# Patient Record
Sex: Male | Born: 1950
Health system: Southern US, Community
[De-identification: ages and names within clinical notes are randomized; demographics above are authoritative.]

## PROBLEM LIST (undated history)

## (undated) DIAGNOSIS — D126 Benign neoplasm of colon, unspecified: Secondary | ICD-10-CM

## (undated) DIAGNOSIS — Z9289 Personal history of other medical treatment: Secondary | ICD-10-CM

## (undated) DIAGNOSIS — M25511 Pain in right shoulder: Secondary | ICD-10-CM

## (undated) DIAGNOSIS — N281 Cyst of kidney, acquired: Secondary | ICD-10-CM

## (undated) DIAGNOSIS — N402 Nodular prostate without lower urinary tract symptoms: Secondary | ICD-10-CM

## (undated) DIAGNOSIS — Z8719 Personal history of other diseases of the digestive system: Secondary | ICD-10-CM

## (undated) DIAGNOSIS — K5730 Diverticulosis of large intestine without perforation or abscess without bleeding: Secondary | ICD-10-CM

## (undated) DIAGNOSIS — I10 Essential (primary) hypertension: Secondary | ICD-10-CM

## (undated) DIAGNOSIS — M79672 Pain in left foot: Secondary | ICD-10-CM

## (undated) DIAGNOSIS — K760 Fatty (change of) liver, not elsewhere classified: Secondary | ICD-10-CM

## (undated) DIAGNOSIS — E785 Hyperlipidemia, unspecified: Secondary | ICD-10-CM

## (undated) DIAGNOSIS — N182 Chronic kidney disease, stage 2 (mild): Secondary | ICD-10-CM

## (undated) DIAGNOSIS — N200 Calculus of kidney: Secondary | ICD-10-CM

## (undated) HISTORY — DX: Cyst of kidney, acquired: N28.1

## (undated) HISTORY — DX: Nodular prostate without lower urinary tract symptoms: N40.2

## (undated) HISTORY — PX: COLONOSCOPY W/ POLYPECTOMY: SHX1380

## (undated) HISTORY — DX: Personal history of other medical treatment: Z92.89

## (undated) HISTORY — DX: Pain in left foot: M79.672

## (undated) HISTORY — DX: Personal history of other diseases of the digestive system: Z87.19

## (undated) HISTORY — DX: Fatty (change of) liver, not elsewhere classified: K76.0

## (undated) HISTORY — DX: Pain in right shoulder: M25.511

## (undated) HISTORY — DX: Diverticulosis of large intestine without perforation or abscess without bleeding: K57.30

## (undated) HISTORY — PX: HEMORRHOID SURGERY: SHX153

## (undated) HISTORY — DX: Calculus of kidney: N20.0

## (undated) HISTORY — PX: CHALAZION EXCISION: SHX213

## (undated) HISTORY — PX: NOSE SURGERY: SHX723

## (undated) HISTORY — DX: Benign neoplasm of colon, unspecified: D12.6

## (undated) HISTORY — DX: Essential (primary) hypertension: I10

## (undated) HISTORY — DX: Chronic kidney disease, stage 2 (mild): N18.2

## (undated) HISTORY — DX: Hyperlipidemia, unspecified: E78.5

---

## 1998-03-31 ENCOUNTER — Ambulatory Visit (HOSPITAL_COMMUNITY): Admission: RE | Admit: 1998-03-31 | Discharge: 1998-03-31 | Payer: Self-pay | Admitting: Cardiology

## 2000-01-03 ENCOUNTER — Ambulatory Visit (HOSPITAL_COMMUNITY): Admission: RE | Admit: 2000-01-03 | Discharge: 2000-01-03 | Payer: Self-pay | Admitting: Cardiology

## 2000-01-03 ENCOUNTER — Encounter: Payer: Self-pay | Admitting: Cardiology

## 2001-12-24 ENCOUNTER — Encounter: Payer: Self-pay | Admitting: Cardiology

## 2001-12-24 ENCOUNTER — Ambulatory Visit (HOSPITAL_COMMUNITY): Admission: RE | Admit: 2001-12-24 | Discharge: 2001-12-24 | Payer: Self-pay | Admitting: Cardiology

## 2002-01-07 ENCOUNTER — Ambulatory Visit (HOSPITAL_COMMUNITY): Admission: RE | Admit: 2002-01-07 | Discharge: 2002-01-07 | Payer: Self-pay | Admitting: Cardiology

## 2002-01-07 ENCOUNTER — Encounter: Payer: Self-pay | Admitting: Cardiology

## 2002-03-01 ENCOUNTER — Emergency Department (HOSPITAL_COMMUNITY): Admission: EM | Admit: 2002-03-01 | Discharge: 2002-03-01 | Payer: Self-pay | Admitting: Emergency Medicine

## 2002-03-01 ENCOUNTER — Encounter: Payer: Self-pay | Admitting: Emergency Medicine

## 2005-12-26 ENCOUNTER — Ambulatory Visit (HOSPITAL_COMMUNITY): Admission: RE | Admit: 2005-12-26 | Discharge: 2005-12-26 | Payer: Self-pay | Admitting: Cardiology

## 2006-03-31 ENCOUNTER — Ambulatory Visit (HOSPITAL_COMMUNITY): Admission: RE | Admit: 2006-03-31 | Discharge: 2006-03-31 | Payer: Self-pay | Admitting: Cardiology

## 2006-04-04 ENCOUNTER — Encounter: Payer: Self-pay | Admitting: Family Medicine

## 2006-04-12 ENCOUNTER — Encounter (INDEPENDENT_AMBULATORY_CARE_PROVIDER_SITE_OTHER): Payer: Self-pay | Admitting: *Deleted

## 2006-04-12 ENCOUNTER — Encounter: Admission: RE | Admit: 2006-04-12 | Discharge: 2006-04-12 | Payer: Self-pay | Admitting: *Deleted

## 2006-04-18 DIAGNOSIS — D126 Benign neoplasm of colon, unspecified: Secondary | ICD-10-CM

## 2006-04-18 HISTORY — DX: Benign neoplasm of colon, unspecified: D12.6

## 2006-04-20 ENCOUNTER — Encounter (INDEPENDENT_AMBULATORY_CARE_PROVIDER_SITE_OTHER): Payer: Self-pay | Admitting: *Deleted

## 2006-04-20 ENCOUNTER — Encounter (INDEPENDENT_AMBULATORY_CARE_PROVIDER_SITE_OTHER): Payer: Self-pay | Admitting: Specialist

## 2006-04-20 ENCOUNTER — Ambulatory Visit (HOSPITAL_COMMUNITY): Admission: RE | Admit: 2006-04-20 | Discharge: 2006-04-20 | Payer: Self-pay | Admitting: *Deleted

## 2006-05-29 ENCOUNTER — Encounter: Payer: Self-pay | Admitting: Family Medicine

## 2006-12-16 ENCOUNTER — Inpatient Hospital Stay (HOSPITAL_COMMUNITY): Admission: EM | Admit: 2006-12-16 | Discharge: 2006-12-18 | Payer: Self-pay | Admitting: Emergency Medicine

## 2006-12-19 HISTORY — PX: ESOPHAGOGASTRODUODENOSCOPY: SHX1529

## 2007-01-22 ENCOUNTER — Encounter: Payer: Self-pay | Admitting: Family Medicine

## 2007-01-23 ENCOUNTER — Encounter: Payer: Self-pay | Admitting: Family Medicine

## 2007-02-15 ENCOUNTER — Encounter: Admission: RE | Admit: 2007-02-15 | Discharge: 2007-02-15 | Payer: Self-pay | Admitting: *Deleted

## 2007-06-26 ENCOUNTER — Encounter: Payer: Self-pay | Admitting: Family Medicine

## 2007-06-27 ENCOUNTER — Encounter: Payer: Self-pay | Admitting: Family Medicine

## 2007-07-11 ENCOUNTER — Encounter (INDEPENDENT_AMBULATORY_CARE_PROVIDER_SITE_OTHER): Payer: Self-pay | Admitting: *Deleted

## 2007-07-11 ENCOUNTER — Ambulatory Visit (HOSPITAL_COMMUNITY): Admission: RE | Admit: 2007-07-11 | Discharge: 2007-07-11 | Payer: Self-pay | Admitting: *Deleted

## 2007-08-11 IMAGING — CT CT PELVIS W/ CM
2 of 6 series · 13 of 32 positions shown, 18 images · IV contrast (READICAT/WATER)
Comparison: Prior CT of 04/12/06.

CLINICAL DATA: Abdominal pain particularly right upper quadrant. 
 ABDOMEN CT WITHOUT AND WITH CONTRAST:
TECHNIQUE: Multidetector CT imaging of the abdomen was performed both before and during bolus administration of intravenous contrast.
 Contrast:  125 cc Omnipaque 300
TECHNIQUE: Multidetector CT imaging of the pelvis was performed following the standard protocol during bolus administration of intravenous contrast.

[Series 5: abd/pelvis w/ · axial · 0.80mm/px · z∈[-398,-78]mm · 5 of 96 slices shown, 10 images]
[im 16/96  soft-tissue]
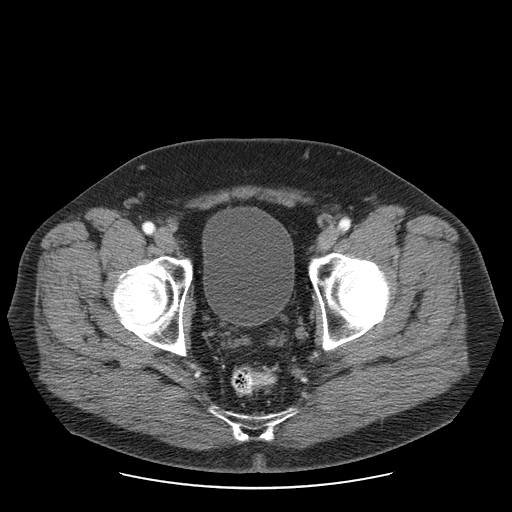
[im 16/96  bone]
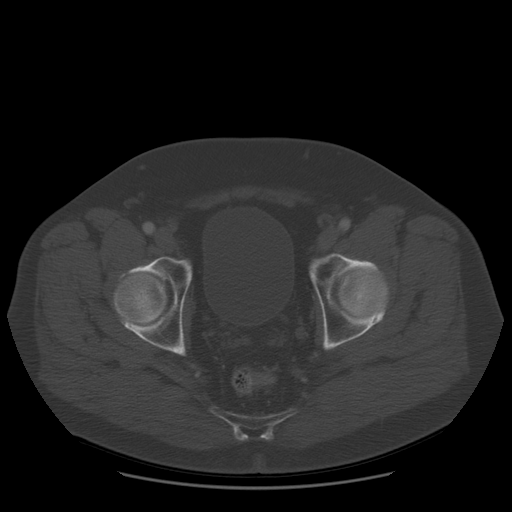
[im 32/96  soft-tissue]
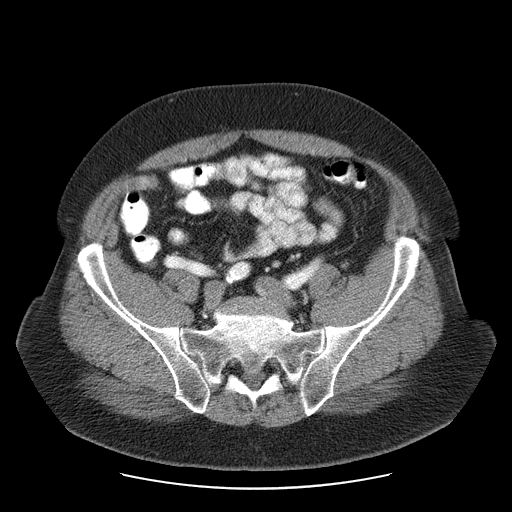
[im 32/96  lung]
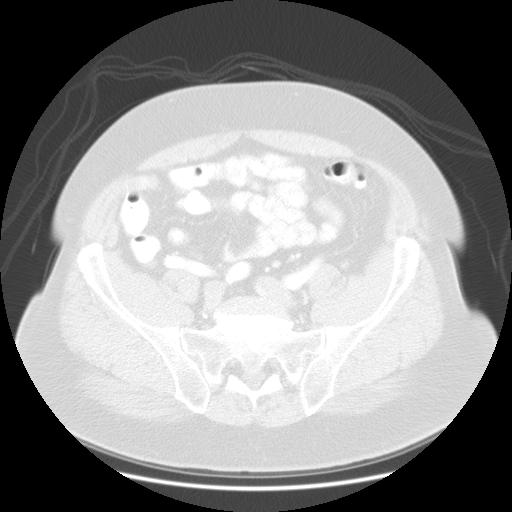
[im 48/96  soft-tissue]
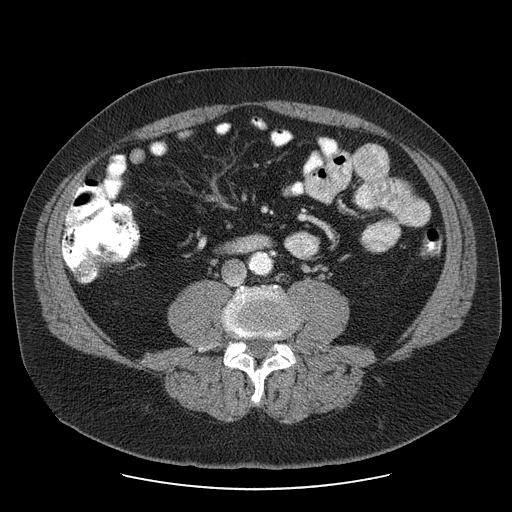
[im 48/96  lung]
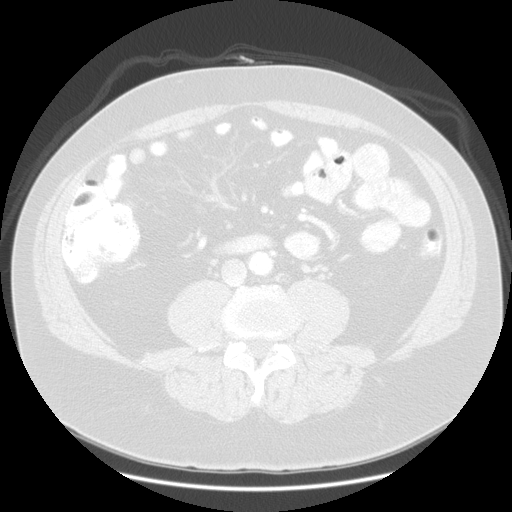
[im 64/96  soft-tissue]
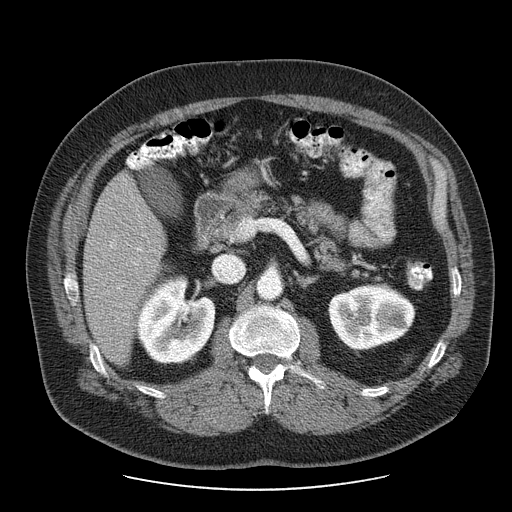
[im 64/96  lung]
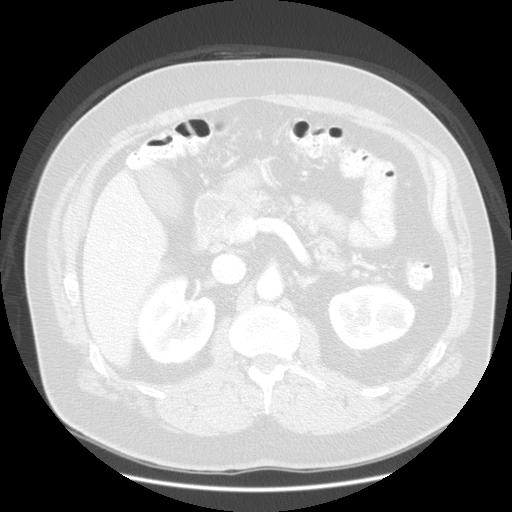
[im 80/96  soft-tissue]
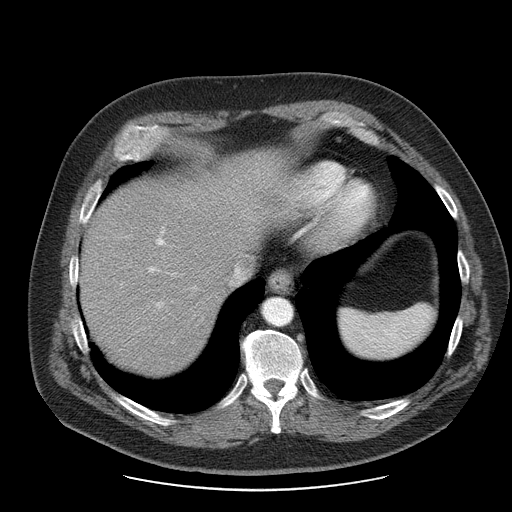
[im 80/96  lung]
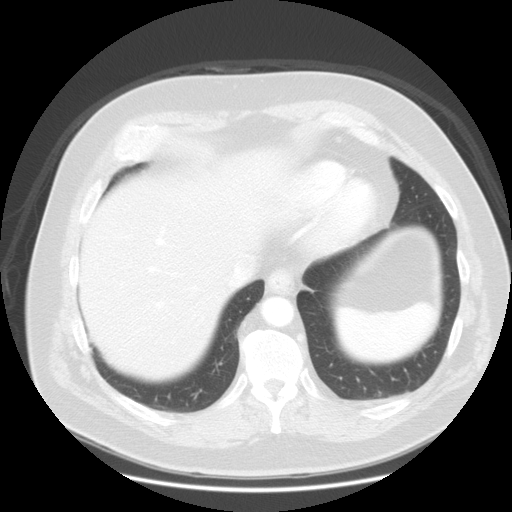

[Series 701: sagittal · sagittal · 0.96mm/px · 8 of 145 slices shown]
[im 17/145  soft-tissue]
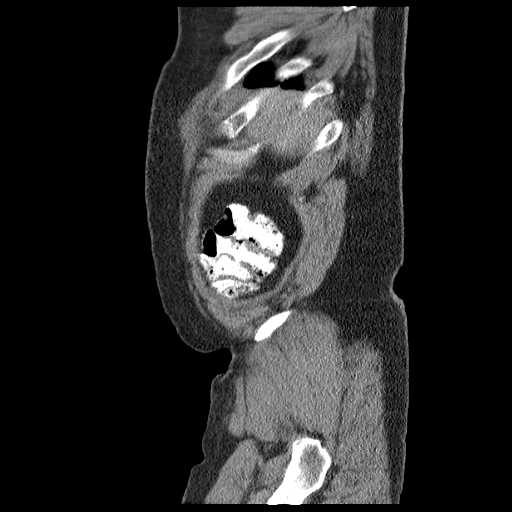
[im 33/145  soft-tissue]
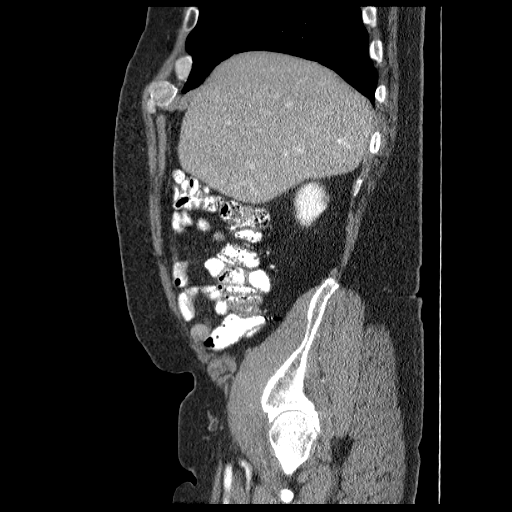
[im 49/145  soft-tissue]
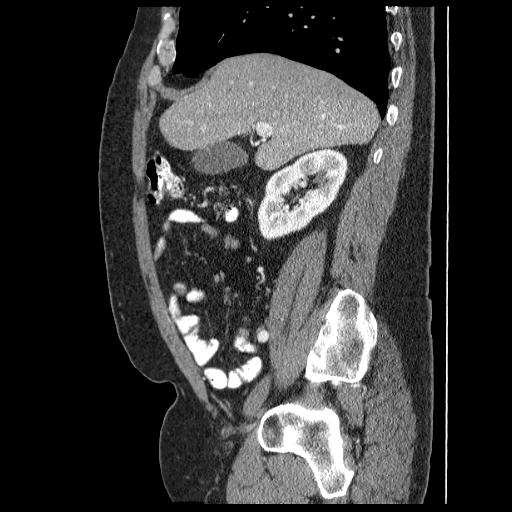
[im 65/145  soft-tissue]
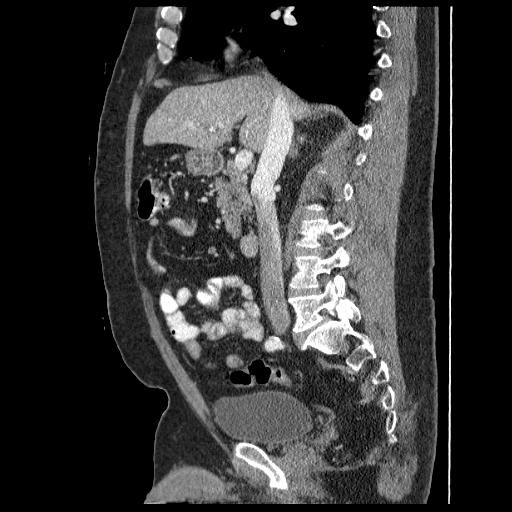
[im 81/145  soft-tissue]
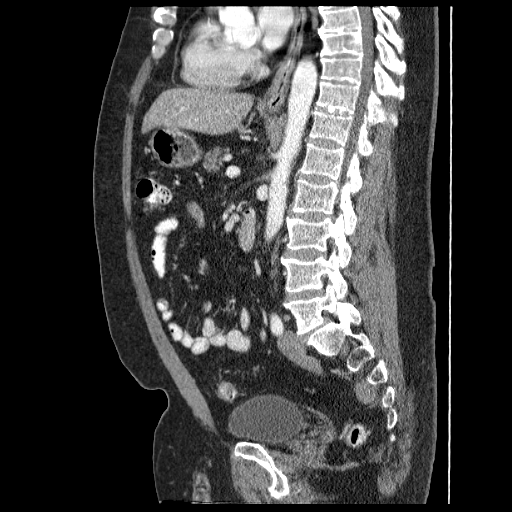
[im 97/145  soft-tissue]
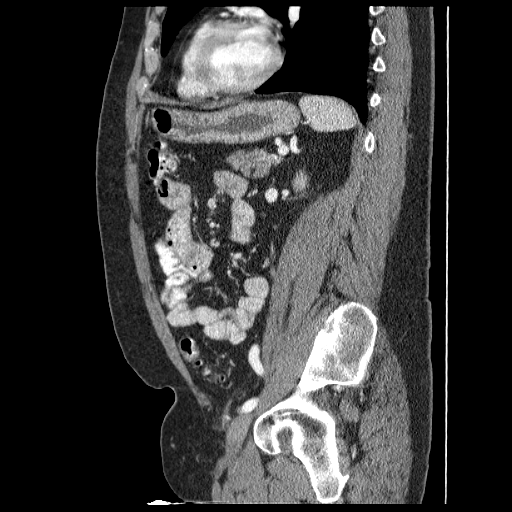
[im 113/145  soft-tissue]
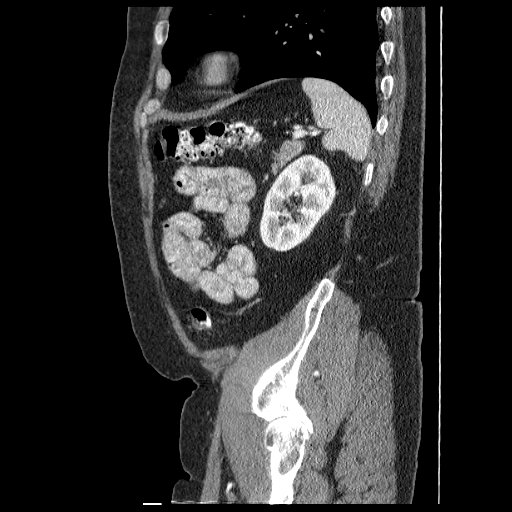
[im 129/145  soft-tissue]
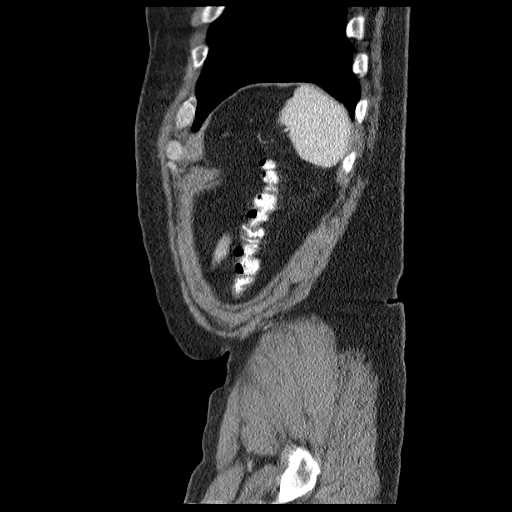

[13 of 32 positions shown; findings below may reference images not displayed]

FINDINGS: On the unenhanced phase, there is a probable small hemorrhagic cyst within the left mid upper kidney.   No renal calculi are seen and no hydronephrosis is noted.  No calcified gallstones are seen.  
 After contrast enhancement, the liver enhances with no focal abnormality.  The pancreas is normal in size with normal peripancreatic fat planes.  The adrenal glands appear stable with a probable tiny right adrenal adenoma present.  The spleen is normal in size.  There is no change in a probable left upper pole renal cyst.  The pelvocaliceal systems appear normal.  The ureters are normal in caliber.  The abdominal aorta is normal.
IMPRESSION: Stable CT of the abdomen.  No renal calculi.   No gallstones.  Stable small hemorrhagic left renal cyst. 
 PELVIS CT WITH CONTRAST:
FINDINGS: Surgical clips are present within the right lower quadrant. The terminal ileum appears normal.  The urinary bladder is unremarkable.  Rectosigmoid colonic diverticula are present, but no diverticulitis is seen.  The common iliac arteries are noted to be very tortuous, right more so than left.  The thoracolumbar vertebra are in normal alignment with normal disk spaces, and only mild anterior osteophyte formation in the lower thoracic spine.
IMPRESSION: Rectosigmoid colonic diverticula.  No diverticulitis.  No acute process.

## 2009-06-29 ENCOUNTER — Ambulatory Visit: Payer: Self-pay | Admitting: Diagnostic Radiology

## 2009-06-29 ENCOUNTER — Ambulatory Visit (HOSPITAL_BASED_OUTPATIENT_CLINIC_OR_DEPARTMENT_OTHER): Admission: RE | Admit: 2009-06-29 | Discharge: 2009-06-29 | Payer: Self-pay | Admitting: Family Medicine

## 2011-01-09 ENCOUNTER — Encounter: Payer: Self-pay | Admitting: *Deleted

## 2011-02-04 ENCOUNTER — Encounter: Payer: Self-pay | Admitting: Family Medicine

## 2011-02-04 ENCOUNTER — Telehealth: Payer: Self-pay | Admitting: Family Medicine

## 2011-02-04 ENCOUNTER — Ambulatory Visit (INDEPENDENT_AMBULATORY_CARE_PROVIDER_SITE_OTHER): Payer: Self-pay | Admitting: Family Medicine

## 2011-02-04 DIAGNOSIS — E785 Hyperlipidemia, unspecified: Secondary | ICD-10-CM

## 2011-02-04 DIAGNOSIS — R350 Frequency of micturition: Secondary | ICD-10-CM | POA: Insufficient documentation

## 2011-02-04 DIAGNOSIS — E782 Mixed hyperlipidemia: Secondary | ICD-10-CM | POA: Insufficient documentation

## 2011-02-04 DIAGNOSIS — J209 Acute bronchitis, unspecified: Secondary | ICD-10-CM

## 2011-02-04 DIAGNOSIS — E119 Type 2 diabetes mellitus without complications: Secondary | ICD-10-CM

## 2011-02-04 LAB — CONVERTED CEMR LAB
Bilirubin Urine: NEGATIVE
Glucose, Urine, Semiquant: >=1000
Protein, U semiquant: NEGATIVE
pH: 6

## 2011-02-05 ENCOUNTER — Encounter: Payer: Self-pay | Admitting: Family Medicine

## 2011-02-05 LAB — CONVERTED CEMR LAB: Microalb, Ur: 1.78 mg/dL (ref 0.00–1.89)

## 2011-02-07 ENCOUNTER — Encounter: Payer: Self-pay | Admitting: Family Medicine

## 2011-02-07 ENCOUNTER — Other Ambulatory Visit: Payer: Self-pay | Admitting: Family Medicine

## 2011-02-07 ENCOUNTER — Telehealth: Payer: Self-pay | Admitting: Family Medicine

## 2011-02-07 ENCOUNTER — Ambulatory Visit (INDEPENDENT_AMBULATORY_CARE_PROVIDER_SITE_OTHER): Payer: BC Managed Care – PPO | Admitting: Family Medicine

## 2011-02-07 DIAGNOSIS — B070 Plantar wart: Secondary | ICD-10-CM | POA: Insufficient documentation

## 2011-02-07 DIAGNOSIS — E785 Hyperlipidemia, unspecified: Secondary | ICD-10-CM

## 2011-02-07 DIAGNOSIS — R5381 Other malaise: Secondary | ICD-10-CM

## 2011-02-07 DIAGNOSIS — E119 Type 2 diabetes mellitus without complications: Secondary | ICD-10-CM

## 2011-02-07 DIAGNOSIS — R5383 Other fatigue: Secondary | ICD-10-CM

## 2011-02-07 DIAGNOSIS — R350 Frequency of micturition: Secondary | ICD-10-CM

## 2011-02-07 DIAGNOSIS — J209 Acute bronchitis, unspecified: Secondary | ICD-10-CM

## 2011-02-08 ENCOUNTER — Ambulatory Visit (INDEPENDENT_AMBULATORY_CARE_PROVIDER_SITE_OTHER)
Admission: RE | Admit: 2011-02-08 | Discharge: 2011-02-08 | Disposition: A | Discharge status: Home / Self Care | Payer: BC Managed Care – PPO | Source: Ambulatory Visit | Attending: Family Medicine | Admitting: Family Medicine

## 2011-02-08 ENCOUNTER — Encounter (INDEPENDENT_AMBULATORY_CARE_PROVIDER_SITE_OTHER): Payer: Self-pay | Admitting: *Deleted

## 2011-02-08 ENCOUNTER — Encounter: Payer: Self-pay | Admitting: Family Medicine

## 2011-02-08 ENCOUNTER — Other Ambulatory Visit: Payer: Self-pay | Admitting: Family Medicine

## 2011-02-08 ENCOUNTER — Telehealth: Payer: Self-pay | Admitting: Family Medicine

## 2011-02-08 ENCOUNTER — Other Ambulatory Visit: Payer: BC Managed Care – PPO

## 2011-02-08 DIAGNOSIS — E785 Hyperlipidemia, unspecified: Secondary | ICD-10-CM

## 2011-02-08 DIAGNOSIS — R5381 Other malaise: Secondary | ICD-10-CM

## 2011-02-08 DIAGNOSIS — J209 Acute bronchitis, unspecified: Secondary | ICD-10-CM

## 2011-02-08 DIAGNOSIS — R5383 Other fatigue: Secondary | ICD-10-CM

## 2011-02-08 LAB — HEPATIC FUNCTION PANEL
ALT: 43 U/L (ref 0–53)
Alkaline Phosphatase: 54 U/L (ref 39–117)
Bilirubin, Direct: 0.2 mg/dL (ref 0.0–0.3)
Total Bilirubin: 0.6 mg/dL (ref 0.3–1.2)

## 2011-02-08 LAB — CBC WITH DIFFERENTIAL/PLATELET
Eosinophils Absolute: 0.2 10*3/uL (ref 0.0–0.7)
Hemoglobin: 15.8 g/dL (ref 13.0–17.0)
Lymphocytes Relative: 21.1 % (ref 12.0–46.0)
Lymphs Abs: 1.1 10*3/uL (ref 0.7–4.0)
MCHC: 35.2 g/dL (ref 30.0–36.0)
Monocytes Absolute: 0.4 10*3/uL (ref 0.1–1.0)
Platelets: 172 10*3/uL (ref 150.0–400.0)
RDW: 12.4 % (ref 11.5–14.6)
WBC: 5.2 10*3/uL (ref 4.5–10.5)

## 2011-02-08 LAB — LIPID PANEL
HDL: 48.7 mg/dL (ref >39.00)
Total CHOL/HDL Ratio: 6
Triglycerides: 421 mg/dL — ABNORMAL HIGH (ref 0.0–149.0)

## 2011-02-08 LAB — BASIC METABOLIC PANEL
CO2: 32 mEq/L (ref 19–32)
Chloride: 96 mEq/L (ref 96–112)
Creatinine, Ser: 1 mg/dL (ref 0.4–1.5)
Potassium: 4.4 mEq/L (ref 3.5–5.1)
Sodium: 137 mEq/L (ref 135–145)

## 2011-02-08 LAB — TSH: TSH: 0.81 u[IU]/mL (ref 0.35–5.50)

## 2011-02-08 LAB — LDL CHOLESTEROL, DIRECT: Direct LDL: 149.7 mg/dL

## 2011-02-09 ENCOUNTER — Telehealth: Payer: Self-pay | Admitting: Family Medicine

## 2011-02-09 NOTE — Assessment & Plan Note (Signed)
Summary: GLUCOSE PROBLEMS   Vital Signs:  Patient profile:   60 year old male Height:      70 inches Weight:      200 pounds BMI:     28.80 O2 Sat:      96 % on Room air Pulse rate:   81 / minute BP sitting:   123 / 85  (right arm) Cuff size:   large  Vitals Entered By: Francee Piccolo CMA Duncan Dull) (February 04, 2011 3:23 PM)  O2 Flow:  Room air CC: elevated glucose--550 and 450 at home, 389 at [facility], pt has also had URI--on abx from previous doctor Is Patient Diabetic? Yes Did you bring your meter with you today? No   History of Present Illness: 60 y/o WM here to establish care, has c/o elevated glucoses and prolonged resp illness. Formerly seen by Dr. Smitty Cords, an MD who apparently has a limited practice currently/partially retired. Shaun Knight has had about 2 weeks of coughing, starting out with typical URI symptoms and low grade fevers.  This part ran its course and now he has some periodic "coughing fits" through the day and night. Question of intermittent wheezing.  No SOB, no chest pain, no ST.  No significant mucous production.  No HA.  He was rx'd omnicef a few days ago and has noted no change.  Also taking tessalon perles and hydrocodone cough syrup and noting no improvement.   During this time he has had increased thirst and urinary output.  Mild fatigue but still working, still eating and drinking well.  Drinking alot of orange juice lately, too. Has DM 2, dx'd approx 1 yr ago and placed on metformin at that time.  Rarely checks glucose but wife has insisted some lately and his numbers have been 300-400 range at times.  He reports feeling "fine". Diet is diabetic diet "sometimes" but he admits to cheating on this fairly regularly.  He does not exercise. He is a classic type A personality, workaholic.  No smoking, no ETOH. Has been seeing someone at the bariatric/Wt loss clinic on [address], was put on HCTZ and phentermine and has lost about 20 lbs.  Preventive  Screening-Counseling & Management  Alcohol-Tobacco     Alcohol drinks/day: 0     Smoking Status: never  Current Medications (verified): 1)  Cheratussin Ac 100-10 Mg/50ml Syrp (Guaifenesin-Codeine) .... 2 Teaspoons Every 4-6 Hours As Needed 2)  Cefdinir 300 Mg Caps (Cefdinir) .... Take 1 Tablet By Mouth Two Times A Day 3)  Kls Aller-Tec 10 Mg Tabs (Cetirizine Hcl) .... Take 1 Tablet By Mouth Once A Day 4)  Metformin Hcl 500 Mg Tabs (Metformin Hcl) .... Take 1 Tablet By Mouth Once A Day 5)  Crestor 10 Mg Tabs (Rosuvastatin Calcium) .... Take 1 Tablet By Mouth Once A Day 6)  Tessalon Perles 100 Mg Caps (Benzonatate) .Marland Kitchen.. 1-2 Three Times A Day 7)  Phentermine Hcl 37.5 Mg Tabs (Phentermine Hcl) .... Take 1 Tablet By Mouth Once A Day 8)  Hydrochlorothiazide 25 Mg Tabs (Hydrochlorothiazide) .... Take 1 Tablet By Mouth Once A Day  Allergies (verified): No Known Drug Allergies  Past History:  Past Medical History: DM 2, non insulin-requiring (dx'd approx 2010) Hyperlipidemia Hx of Hemorrhoids Diverticulosis (hospitalized for diverticulitis) Adenomatous colon polyps (approx 2008, Dr. Virginia Rochester)  Past Surgical History: Hemorrhoidectomy  Family History: Reviewed history and no changes required. Mom and Dad: DM, CAD, HTN Twin sisters: no known problems.  Social History: Reviewed history and no  changes required. Married. 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 Workaholic.  No T/A/Ds. No exercise.Smoking Status:  never  Review of Systems  The patient denies anorexia, fever, weight gain, vision loss, decreased hearing, hoarseness, chest pain, syncope, dyspnea on exertion, peripheral edema, headaches, hemoptysis, abdominal pain, melena, hematochezia, severe indigestion/heartburn, hematuria, incontinence, genital sores, muscle weakness, suspicious skin lesions, transient blindness, difficulty walking, depression, unusual weight change, abnormal bleeding, enlarged lymph  nodes, angioedema, breast masses, and testicular masses.    Physical Exam  General:  VS: noted, all normal. Gen: Alert, well appearing, oriented x 4. HEENT: Scalp without lesions or hair loss.  Ears: EACs clear, normal epithelium.  TMs with good light reflex and landmarks bilaterally.  Eyes: no injection, icteris, swelling, or exudate.  EOMI, PERRLA. Nose: no drainage or turbinate edema/swelling.  No injection or focal lesion.  Mouth: lips without lesion/swelling.  Oral mucosa pink and moist.  Dentition intact and without obvious caries or gingival swelling.  Oropharynx without erythema, exudate, or swelling.  Chest: symmetric expansion, with nonlabored respirations.  Clear and equal breath sounds in all lung fields.   CV: RRR, no m/r/g.  Peripheral pulses 2+/symmetric. ABD: soft, NT, ND, BS normal.  No hepatospenomegaly or mass.  No bruits. EXT: no clubbing, cyanosis, or edema.   Neck: supple.  No lymphadenopathy, thyromegaly, or mass.    Impression & Recommendations:  Problem # 1:  ACUTE BRONCHITIS (ICD-466.0) Assessment New D/C omnicef. Start zithromax to cover atypicals, although this certainly is more likely viral. Start advair 115/21 HFA, 2 puffs two times a day for 2 wks. D/c all current cough meds and try hycodan, rx printed and handed to pt.  Problem # 2:  DM (ICD-250.00) Assessment: New I suspect his control is poor overall, but with superimposed acute illness and poor diet lately his numbers are acutely worse. Will continue metformin but increase to 500mg  two times a day.  Will add tradjenta 5mg  by mouth once daily. Send urine for microalb/cr ratio, gave lab order for HbA1c, lipids, CBC, CMET, TSH. Set up nutrition/diabetes ed class for him. Need to get old records.   Likely needs annual D.R screening.  Orders: T-Comprehensive Metabolic Panel [phone]) T-Lipid Profile [phone]) T-CBC w/Diff [phone]) T-TSH [phone]) TLB-Microalbumin/Creat Ratio,  Urine (82043-MALB) UA Dipstick w/o Micro (manual) (28413) T- Hemoglobin A1C (24401-02725) Nutrition Referral (Nutrition)  Problem # 3:  FREQUENCY, URINARY (ICD-788.41) Assessment: New UA today showed glucose, o/w normal. Glucosuria and diuretic effect. Will do screening PSA today and will do DRE when he comes in for recheck/CPE in 2 wks.  Orders: T-PSA ([id number])  Problem # 4:  HYPERLIPIDEMIA (ICD-272.4) Assessment: New  His updated medication list for this problem includes:    Crestor 10 Mg Tabs (Rosuvastatin calcium) .Marland Kitchen... Take 1 tablet by mouth once a day  Orders: T-Comprehensive Metabolic Panel [phone]) T-Lipid Profile [phone]) T-CBC w/Diff (16606-30160) T-TSH (10932-35573) TLB-Microalbumin/Creat Ratio, Urine (82043-MALB) Nutrition Referral (Nutrition)  Complete Medication List: 1)  Kls Aller-tec 10 Mg Tabs (Cetirizine hcl) .... Take 1 tablet by mouth once a day 2)  Metformin Hcl 500 Mg Tabs (Metformin hcl) .... Take 1 tablet by mouth bid 3)  Crestor 10 Mg Tabs (Rosuvastatin calcium) .... Take 1 tablet by mouth once a day 4)  Phentermine Hcl 37.5 Mg Tabs (Phentermine hcl) .... Take 1 tablet by mouth once a day 5)  Hydrochlorothiazide 25 Mg Tabs (Hydrochlorothiazide) .... Take 1 tablet by mouth once a day 6)  Tradjenta 5 Mg Tabs (Linagliptin) .Marland KitchenMarland KitchenMarland Kitchen 1  tab by mouth qam 7)  Zithromax Z-pak 250 Mg Tabs (Azithromycin) .... As directed 8)  Advair Hfa 115-21 Mcg/act Aero (Fluticasone-salmeterol) .... 2 puffs bid 9)  Hydrocodone-homatropine 5-1.5 Mg/80ml Syrp (Hydrocodone-homatropine) .Marland Kitchen.. 1-2 tsp q6h as needed for cough  Patient Instructions: 1)  Check your sugar every morning before breakfast and every evening 2 hours after supper and write them down. 2)  [facility] [address] at [facility]. 3)  Return in 2 wks for CPE. Prescriptions: HYDROCODONE-HOMATROPINE 5-1.5 MG/5ML SYRP (HYDROCODONE-HOMATROPINE) 1-2 tsp q6h as needed for cough  #4 oz x  1   Entered and Authorized by:   Shaun Knight M.D.   Signed by:   Shaun Knight M.D. on 02/04/2011   Method used:   Print then Give to Patient   RxID:   [id number] ZITHROMAX Z-PAK 250 MG TABS (AZITHROMYCIN) as directed  #1 pack x 0   Entered and Authorized by:   Shaun Knight M.D.   Signed by:   Shaun Knight M.D. on 02/04/2011   Method used:   Electronically to        [facility] [address] [phone]* (retail)       [address] [place], [place]  [address]       Ph: [phone]       Fax: [phone]   RxID:   [id number]    Orders Added: 1)  T-Comprehensive Metabolic Panel [[id number]] 2)  T-Lipid Profile [80061-22930] 3)  T-CBC w/Diff [47425-95638] 4)  T-TSH [75643-32951] 5)  T-PSA [88416-60630] 6)  TLB-Microalbumin/Creat Ratio, Urine [82043-MALB] 7)  UA Dipstick w/o Micro (manual) [81002] 8)  New Patient Level IV [99204] 9)  T- Hemoglobin A1C [83036-23375] 10)  Nutrition Referral [Nutrition]   Immunization History:  Influenza Immunization History:    Influenza:  historical (10/19/2010)   Immunization History:  Influenza Immunization History:    Influenza:  Historical (10/19/2010)  Laboratory Results   Urine Tests  Date/Time Received: February 04, 2011 4:56 PM Date/Time Reported: February 04, 2011 4:56 PM  Routine Urinalysis   Color: yellow Appearance: Clear Glucose: >=1000   (Normal Range: Negative) Bilirubin: negative   (Normal Range: Negative) Ketone: negative   (Normal Range: Negative) Spec. Gravity: 1.010   (Normal Range: 1.003-1.035) Blood: negative   (Normal Range: Negative) pH: 6.0   (Normal Range: 5.0-8.0) Protein: negative   (Normal Range: Negative) Urobilinogen: 0.2   (Normal Range: 0-1) Nitrite: negative   (Normal Range: Negative) Leukocyte Esterace: negative   (Normal Range: Negative)

## 2011-02-10 ENCOUNTER — Telehealth: Payer: Self-pay | Admitting: Family Medicine

## 2011-02-15 NOTE — Progress Notes (Signed)
Summary: Records Release  Phone Note Other Incoming   Summary of Call: Pls request records from Dr. Virginia Rochester (GI in [place]). Initial call taken by: Michell Heinrich M.D.,  February 04, 2011 5:31 PM  Follow-up for Phone Call        Records release has been signed and faxed to the Provider. Follow-up by: Georga Bora,  February 10, 2011 2:35 PM

## 2011-02-15 NOTE — Assessment & Plan Note (Signed)
Summary: PATIENT NOT FEELING WELL   Vital Signs:  Patient profile:   60 year old male Height:      70 inches Weight:      199.50 pounds BMI:     28.73 O2 Sat:      96 % on Room air Temp:     98.7 degrees F oral Pulse rate:   93 / minute BP sitting:   133 / 88  (right arm) Cuff size:   large  Vitals Entered By: Francee Piccolo CMA Duncan Dull) (February 07, 2011 2:34 PM)  O2 Flow:  Room air CC: "drunk feeling", dizzy if he stands too quickly Is Patient Diabetic? Yes Did you bring your meter with you today? Yes   History of Present Illness: 60 y/o WM with recent respiratory illness and poorly controlled DM 2, here for "feeling bad" for about 48 hours or so. I saw him here 3d/a and started him on zithromax, tradjenta, advair, and hycodan cough syrup.  I also increased his glucophage to 500mg  two times a day.  He has noted no significant change in his cough. Denies exertional CP, just describes some fleeting sharp right sided CP at rest a couple of days ago.  No diaphoresis, no SOB, no wheezing.  He vomited once 2 d/a, but has eaten fine since.  BM's normal.  No palpitations or swelling of extremities.  Says he gets some mild,brief dizziness after standing.  Has only been able to get one blood glucose reading since o/v 3 days ago and that was 250 today.  Has a callus+wart on bottom of right foot that he occasionally has to get shaved back and asks for this to get done today if possible.  Current Medications (verified): 1)  Kls Aller-Tec 10 Mg Tabs (Cetirizine Hcl) .... Take 1 Tablet By Mouth Once A Day 2)  Metformin Hcl 500 Mg Tabs (Metformin Hcl) .... Take 1 Tablet By Mouth Bid 3)  Crestor 10 Mg Tabs (Rosuvastatin Calcium) .... Take 1 Tablet By Mouth Once A Day 4)  Phentermine Hcl 37.5 Mg Tabs (Phentermine Hcl) .... Take 1 Tablet By Mouth Once A Day 5)  Hydrochlorothiazide 25 Mg Tabs (Hydrochlorothiazide) .... Take 1 Tablet By Mouth Once A Day 6)  Tradjenta 5 Mg Tabs (Linagliptin) .Marland Kitchen..  1 Tab By Mouth Qam 7)  Zithromax Z-Pak 250 Mg Tabs (Azithromycin) .... As Directed 8)  Advair Hfa 115-21 Mcg/act Aero (Fluticasone-Salmeterol) .... 2 Puffs Bid 9)  Hydrocodone-Homatropine 5-1.5 Mg/42ml Syrp (Hydrocodone-Homatropine) .Marland Kitchen.. 1-2 Tsp Q6h As Needed For Cough  Allergies (verified): No Known Drug Allergies  Past History:  Past Medical History: Last updated: 02/04/2011 DM 2, non insulin-requiring (dx'd approx 2010) Hyperlipidemia Hx of Hemorrhoids Diverticulosis (hospitalized for diverticulitis) Adenomatous colon polyps (approx 2008, Dr. Virginia Rochester)  Past Surgical History: Last updated: 02/04/2011 Hemorrhoidectomy  Family History: Last updated: 02/04/2011 Mom and Dad: DM, CAD, HTN Twin sisters: no known problems.  Social History: Last updated: 02/04/2011 Married. 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 Workaholic.  No T/A/Ds. No exercise.  Risk Factors: Alcohol Use: 0 (02/04/2011)  Risk Factors: Smoking Status: never (02/04/2011)  Review of Systems       see HPI.  Also, no melena or hematochezia.  +urinary frequency unchanged.  No dysuria, no hematuria.  No skin rash. No headaches, no vision or hearing complaints.  No focal weakness.  No depression or anxiety.  Physical Exam  General:  VS: noted, all normal. Gen: Alert, well appearing, oriented x 4. HEENT: Scalp without  lesions or hair loss.  Ears: EACs clear, normal epithelium.  TMs with good light reflex and landmarks bilaterally.  Eyes: no injection, icteris, swelling, or exudate.  EOMI, PERRLA. Nose: no drainage or turbinate edema/swelling.  No injection or focal lesion.  Mouth: lips without lesion/swelling.  Oral mucosa pink and moist.  Dentition intact and without obvious caries or gingival swelling.  Oropharynx without erythema, exudate, or swelling.  Neck: supple.  No lymphadenopathy, thyromegaly, or mass. Chest: symmetric expansion, with nonlabored respirations.  Clear and equal  breath sounds in all lung fields.   CV: RRR, no m/r/g.  Peripheral pulses 2+/symmetric. ABD: soft, NT, ND, BS normal.  No hepatospenomegaly or mass.  No bruits. EXT: no clubbing, cyanosis, or edema.  Right foot, plantar surface: at distal 5th metatarsal area there is a 1cm area of callus and a small verrucous lesion underneath.    Impression & Recommendations:  Problem # 1:  MALAISE (ICD-780.79) Assessment New His glucose today is 267. I think he is feeling RELATIVE hypoglycemia as his blood glucose is trending down from previous higher levels (chronic). His EKG does show delayed R wave progression, with prominent S waves in III and avF;  Suspicious for old anterior/septal MI. No sign of acute coronary syndrome. Would like old records for comparison..--he is to get these to me ASAP. Continue all current treatments, and I would like to eventually get him up to the full 1000mg  two times a day dosing of metformin after he feels better. He has not gone to get his labs at [facility] yet (orders given 3 d/a).  I would like to check a CXR on him given his malaise the last few days with the lingering resp illness.  He'll get this at [facility] tomorrow, and I gave new orders to get his labs drawn there tomorrow as well.  We'll cancel the [facility] labs ordered 3 d/a.  Orders: EKG w/ Interpretation (93000) TLB-BMP (Basic Metabolic Panel-BMET) (80048-METABOL) TLB-CBC Platelet - w/Differential (85025-CBCD) TLB-Hepatic/Liver Function Pnl (80076-HEPATIC) TLB-TSH (Thyroid Stimulating Hormone) (84443-TSH) TLB-Lipid Panel (80061-LIPID) TLB-A1C / Hgb A1C (Glycohemoglobin) (83036-A1C) T-2 View CXR (71020TC)  Problem # 2:  PLANTAR WART (ZOX-096.04) Assessment: New I shaved the callus over this area down today with a #11 scalpel after prepping the area with betadine,  and he felt much better.  Problem # 3:  DM (ICD-250.00) Assessment: New  Glucose check with patient's glucometer here today was 267.     His updated medication list for this problem includes:    Metformin Hcl 500 Mg Tabs (Metformin hcl) .Marland Kitchen... Take 1 tablet by mouth bid    Tradjenta 5 Mg Tabs (Linagliptin) .Marland Kitchen... 1 tab by mouth qam  Orders: TLB-BMP (Basic Metabolic Panel-BMET) (80048-METABOL) TLB-CBC Platelet - w/Differential (85025-CBCD) TLB-Hepatic/Liver Function Pnl (80076-HEPATIC) TLB-TSH (Thyroid Stimulating Hormone) (84443-TSH) TLB-Lipid Panel (80061-LIPID) TLB-A1C / Hgb A1C (Glycohemoglobin) (83036-A1C)  Complete Medication List: 1)  Kls Aller-tec 10 Mg Tabs (Cetirizine hcl) .... Take 1 tablet by mouth once a day 2)  Metformin Hcl 500 Mg Tabs (Metformin hcl) .... Take 1 tablet by mouth bid 3)  Crestor 10 Mg Tabs (Rosuvastatin calcium) .... Take 1 tablet by mouth once a day 4)  Phentermine Hcl 37.5 Mg Tabs (Phentermine hcl) .... Take 1 tablet by mouth once a day 5)  Hydrochlorothiazide 25 Mg Tabs (Hydrochlorothiazide) .... Take 1 tablet by mouth once a day 6)  Tradjenta 5 Mg Tabs (Linagliptin) .Marland Kitchen.. 1 tab by mouth qam 7)  Zithromax Z-pak 250 Mg Tabs (Azithromycin) .Marland KitchenMarland KitchenMarland Kitchen  As directed 8)  Advair Hfa 115-21 Mcg/act Aero (Fluticasone-salmeterol) .... 2 puffs bid 9)  Hydrocodone-homatropine 5-1.5 Mg/37ml Syrp (Hydrocodone-homatropine) .Marland Kitchen.. 1-2 tsp q6h as needed for cough  Other Orders: T-PSA Total ([address])   Orders Added: 1)  EKG w/ Interpretation [93000] 2)  TLB-BMP (Basic Metabolic Panel-BMET) [80048-METABOL] 3)  TLB-CBC Platelet - w/Differential [85025-CBCD] 4)  TLB-Hepatic/Liver Function Pnl [80076-HEPATIC] 5)  TLB-TSH (Thyroid Stimulating Hormone) [84443-TSH] 6)  TLB-Lipid Panel [80061-LIPID] 7)  TLB-A1C / Hgb A1C (Glycohemoglobin) [83036-A1C] 8)  T-2 View CXR [71020TC] 9)  Est. Patient Level IV [40981] 10)  T-PSA Total [[address]]  Appended Document: PATIENT NOT FEELING WELL Faxed medical record request to [facility]

## 2011-02-15 NOTE — Progress Notes (Signed)
  Phone Note Other Incoming   Summary of Call: Discussed lab results with pt.Marland KitchenMarland KitchenMarland KitchenHbA1c 14.7%, trig and LDL up.  Plan is to d/c tradjenta, increase metformin to 1000mg  two times a day, increase crestor to 20mg  once daily, start Lantus 10 U at bedtime and titrate up by 1 U once daily until fasting glucose in 90-120 range.   Plan to recheck HbA1c, CMET, and lipids in 3 months.  Check glucose two times a day and f/u as scheduled in about 2 wks. Stephanie, CMA, did insulin injection teaching for him today in the office. Initial call taken by: Michell Heinrich M.D.,  February 08, 2011 2:48 PM    New/Updated Medications: METFORMIN HCL 1000 MG TABS (METFORMIN HCL) 1tab by mouth bid CRESTOR 20 MG TABS (ROSUVASTATIN CALCIUM) 1 tab by mouth qd * ONE TOUCH ULTRA GLUCOMETER STRIPS check glucose twice daily Prescriptions: CRESTOR 20 MG TABS (ROSUVASTATIN CALCIUM) 1 tab by mouth qd  #30 x 6   Entered and Authorized by:   Michell Heinrich M.D.   Signed by:   Michell Heinrich M.D. on 02/08/2011   Method used:   Electronically to        [facility] [phone] (retail)       [address] [place], [place]  [address]       Ph: [phone]       Fax: [phone]   RxID:   [id number] METFORMIN HCL 1000 MG TABS (METFORMIN HCL) 1tab by mouth bid  #60 x 6   Entered and Authorized by:   Michell Heinrich M.D.   Signed by:   Michell Heinrich M.D. on 02/08/2011   Method used:   Electronically to        [facility] [address] [phone]* (retail)       [address] [place], [place]  [address]       Ph: [phone]       Fax: [phone]   RxID:   [id number] ONE TOUCH ULTRA GLUCOMETER STRIPS check glucose twice daily  #60 x 6   Entered and Authorized by:   Michell Heinrich M.D.   Signed by:   Michell Heinrich M.D. on 02/08/2011   Method used:   Print then Give to Patient   RxID:   [id number]   Appended Document:  His HbA1c today was 13.7%, not 14.7% as stated in my  phone note from today.

## 2011-02-15 NOTE — Progress Notes (Signed)
Summary: Diarrhea  Phone Note Call from Patient Call back at [phone]   Caller: wife-Denise Reason for Call: Acute Illness Summary of Call: Pt woke up at 1am with diarrhea and cold sweats.  Pt increased metformin and took lantus last night. CBG's were 212-279, FBS this morning was 246.  Pt is having eggs and toast for breakfast. Per Dr. Milinda Cave, pt should decrease metformin to one tablet two times a day.  Pt wife is notified.  She is agreeable with plan. Initial call taken by: Francee Piccolo CMA Duncan Dull),  February 09, 2011 8:40 AM     Appended Document: Diarrhea Pts spouse states pt has diarrhea and is weak. Pts spouse states pt is keeping food down and drinking Gatorade. Should pt increase Lantus unit tonight? Per Dr Abner Greenspan pt should decrease Metformin to 1/2 (500mg ) tablet tonight and 1/2 (500mg ) tomorrow morning. Do not increase Lantus tonight. Call Dr Milinda Cave in the morning to discusse further detail. Go to ER if not any better tonight. Drink plenty of Gatordade to stay hydrated.  Pts spouse informed

## 2011-02-15 NOTE — Progress Notes (Signed)
Summary: Meds  Phone Note Other Incoming   Summary of Call: Pls call and make sure he got my message about increasing his metformin to one 500mg  in the morning AND one in the evening (plus the new med we started Friday, Tradjenta).   Initial call taken by: Michell Heinrich M.D.,  February 07, 2011 10:05 AM  Follow-up for Phone Call        pt aware Follow-up by: Francee Piccolo CMA Duncan Dull),  February 07, 2011 2:49 PM

## 2011-02-15 NOTE — Progress Notes (Signed)
  Phone Note Other Incoming   Summary of Call: Pt.came in for f/u, still having mild dizzy feeling upon standing--goes away in seconds.  No vertigo, no presyncope/syncope.   After taking 1000mg  dose of metformin and starting lantus 10 U, he had watery BMs for one night, with nausea and brief period of vomiting.  Since then he has pushed fluids and BMs are now infrequent and soft/mushy but not watery.  He backed down to 500mg  two times a day metformin, still on 10 U lantus at bedtime. Some subjective fevers and still the sensation of "not feeling right".  Headache today.  NO ST, no URI/cough, no CP, SOB, or palpitations. BP here today 142/84, P 75, T 98.2.  Exam normal.  Appears well.  Glucose here 218.  Imp: viral syndrome vs prolonged med side effect (metformin).  Plan: continue metformin 500mg  two times a day and Lantus at bedtime.  Increase lantus by 1 unit at bedtime until fasting glucose in 90-120 range.  Push low sugar fluids, call for problems, f/u as scheduled. Initial call taken by: Michell Heinrich M.D.,  February 10, 2011 4:42 PM

## 2011-02-18 ENCOUNTER — Ambulatory Visit (INDEPENDENT_AMBULATORY_CARE_PROVIDER_SITE_OTHER): Payer: BC Managed Care – PPO | Admitting: Family Medicine

## 2011-02-18 ENCOUNTER — Ambulatory Visit: Payer: BC Managed Care – PPO | Admitting: Family Medicine

## 2011-02-18 ENCOUNTER — Encounter: Payer: Self-pay | Admitting: Family Medicine

## 2011-02-18 DIAGNOSIS — G4700 Insomnia, unspecified: Secondary | ICD-10-CM

## 2011-02-18 DIAGNOSIS — E119 Type 2 diabetes mellitus without complications: Secondary | ICD-10-CM

## 2011-02-18 DIAGNOSIS — R9431 Abnormal electrocardiogram [ECG] [EKG]: Secondary | ICD-10-CM

## 2011-02-18 DIAGNOSIS — M67919 Unspecified disorder of synovium and tendon, unspecified shoulder: Secondary | ICD-10-CM | POA: Insufficient documentation

## 2011-02-18 DIAGNOSIS — M719 Bursopathy, unspecified: Secondary | ICD-10-CM

## 2011-02-21 ENCOUNTER — Ambulatory Visit (INDEPENDENT_AMBULATORY_CARE_PROVIDER_SITE_OTHER): Payer: BC Managed Care – PPO | Admitting: Cardiology

## 2011-02-21 DIAGNOSIS — E119 Type 2 diabetes mellitus without complications: Secondary | ICD-10-CM

## 2011-02-21 DIAGNOSIS — I10 Essential (primary) hypertension: Secondary | ICD-10-CM

## 2011-02-21 DIAGNOSIS — E7800 Pure hypercholesterolemia, unspecified: Secondary | ICD-10-CM

## 2011-02-23 ENCOUNTER — Encounter: Payer: Self-pay | Admitting: Family Medicine

## 2011-02-24 ENCOUNTER — Encounter: Payer: Self-pay | Admitting: Family Medicine

## 2011-02-24 NOTE — Assessment & Plan Note (Signed)
Summary: FOLLOW UP   Vital Signs:  Patient profile:   60 year old male Height:      70 inches (177.80 cm) Weight:      202 pounds (91.82 kg) O2 Sat:      98 % on Room air Temp:     97.8 degrees F (36.56 degrees C) oral Pulse rate:   66 / minute BP sitting:   126 / 84  (right arm) Cuff size:   large  Vitals Entered By: Josph Macho RMA (February 18, 2011 10:44 AM)  O2 Flow:  Room air CC: 2 week follow up/ CF Is Patient Diabetic? Yes   History of Present Illness: 60 y/o WM here for DM 2 f/u. Feeling much better since his recent gastroenteritis resolved, plus his glucoses are coming down some. He's compliant with glucose checks two times a day--ranges 170s to low 200s.  He's eating better but still not making time to exercise.  He is "experimenting" some with his metformin and lantus and is a bit perplexed by glucose numbers still staying lower without meds.  We spent some more time reviewing the variables that may affect glucose today: diet, activity level, stress, acute illness, meds.  He c/o some long term difficulty sleeping, both initiation and maintenance.  No restless legs. Says his mind won't shut down.  Dreams alot every night.  Wakes up not feeling rested. Denies depressed mood.  No sleep walking.  He snores but wife hasn't noted any apnea. Snoring has improved since recent wt loss.  Also c/o intermittent problems with right shoulder: describes pain when lying supine in bed for long period with right arm fully extended and abducted.  No pain during daytime.  If he sleeps on left side it doesn't bother him.  Has hx of similar problem that required cortisone injection x 2 in the past.  Has only been bothering him a few days this time.  No history of acute shoulder injury.  No arm weakness, no tingling.  No neck pain or radiating arm pain.   Current Medications (verified): 1)  Kls Aller-Tec 10 Mg Tabs (Cetirizine Hcl) .... Take 1 Tablet By Mouth Once A Day 2)  Metformin Hcl  500 Mg Tabs (Metformin Hcl) .Marland Kitchen.. 1 Tab By Mouth Bid 3)  Crestor 20 Mg Tabs (Rosuvastatin Calcium) .Marland Kitchen.. 1 Tab By Mouth Qd 4)  Phentermine Hcl 37.5 Mg Tabs (Phentermine Hcl) .... Take 1 Tablet By Mouth Once A Day 5)  Hydrochlorothiazide 25 Mg Tabs (Hydrochlorothiazide) .... Take 1 Tablet By Mouth Once A Day 6)  Advair Hfa 115-21 Mcg/act Aero (Fluticasone-Salmeterol) .... 2 Puffs Bid 7)  Hydrocodone-Homatropine 5-1.5 Mg/60ml Syrp (Hydrocodone-Homatropine) .Marland Kitchen.. 1-2 Tsp Q6h As Needed For Cough 8)  One Touch Ultra Glucometer Strips .... Check Glucose Twice Daily 9)  Lantus Solostar 100 Unit/ml Soln (Insulin Glargine) .Marland Kitchen.. 10 U Subcutaneously At Bedtime --Titrating  Allergies (verified): No Known Drug Allergies  Comments:  Nurse/Medical Assistant: The patient's medications and allergies were reviewed with the patient and were updated in the Medication and Allergy Lists. Josph Macho RMA (February 18, 2011 10:46 AM)  Past History:  Past Medical History: DM 2, insulin-requiring (dx'd approx 2010). Hyperlipidemia Hx of Hemorrhoids Diverticulosis (hospitalized for diverticulitis) Adenomatous colon polyps (approx 2008, Dr. Virginia Rochester) Abnormal EKG 01/2011  Review of Systems       No CP, no dyspnea, no palpitations, no nausea, no diaphoresis, no PND or orthopnea.  No cough.  No leg swelling.  Physical Exam  General:  VS: noted, all normal. Gen: Alert, well appearing, oriented x 4. Chest: symmetric expansion, with nonlabored respirations.  Clear and equal breath sounds in all lung fields.   CV: RRR, no m/r/g.  Peripheral pulses 2+/symmetric. EXT: no clubbing, cyanosis, or edema.     Impression & Recommendations:  Problem # 1:  DM (ICD-250.00) Assessment Improved Will titrate his metformin back up to 1000mg  two times a day now that his acute GE is resolved. Encouraged him to take lantus as directed, goals discussed again, continue two times a day glucose checks. Follow up in 1 mo  here.  His updated medication list for this problem includes:    Metformin Hcl 500 Mg Tabs (Metformin hcl) .Marland Kitchen... 1 tab by mouth bid    Lantus Solostar 100 Unit/ml Soln (Insulin glargine) .Marland KitchenMarland KitchenMarland KitchenMarland Kitchen 10 u subcutaneously at bedtime --titrating    Aspirin 81 Mg Tbec (Aspirin)  Problem # 2:  INSOMNIA (ICD-780.52) Assessment: New Start trial of trazodone 50mg , 1-2 at bedtime as needed.  Problem # 3:  ROTATOR CUFF SYNDROME (ICD-726.10) Assessment: New At this point he'll simply try avoiding the motions that bring on impingement (his sleeping position).  May try NSAIDs as needed.  Problem # 4:  ABNORMAL ELECTROCARDIOGRAM (ICD-794.31) Assessment: New With his risk factors I feel that even his minimally abnormal EKG last month (poor R wave progression) is worth sending him to a cardiologist for evaluation/risk stratification.  He requests Dr. Roger Shelter.   I recommended he start daily ASA 81mg .  Orders: Cardiology Referral (Cardiology)  Complete Medication List: 1)  Kls Aller-tec 10 Mg Tabs (Cetirizine hcl) .... Take 1 tablet by mouth once a day 2)  Metformin Hcl 500 Mg Tabs (Metformin hcl) .Marland Kitchen.. 1 tab by mouth bid 3)  Crestor 20 Mg Tabs (Rosuvastatin calcium) .Marland Kitchen.. 1 tab by mouth qd 4)  Phentermine Hcl 37.5 Mg Tabs (Phentermine hcl) .... Take 1 tablet by mouth once a day 5)  Hydrochlorothiazide 25 Mg Tabs (Hydrochlorothiazide) .... Take 1 tablet by mouth once a day 6)  One Touch Ultra Glucometer Strips  .... Check glucose twice daily 7)  Lantus Solostar 100 Unit/ml Soln (Insulin glargine) .Marland Kitchen.. 10 u subcutaneously at bedtime --titrating 8)  Aspirin 81 Mg Tbec (Aspirin) 9)  Trazodone Hcl 50 Mg Tabs (Trazodone hcl) .Marland Kitchen.. 1-2 tabs by mouth at bedtime as needed for insomnia  Patient Instructions: 1)  Start one 81mg  ("baby") aspirin every day. 2)  We will contact you with details of your cardiologist referral. 3)  Follow up in 1 month. Prescriptions: TRAZODONE HCL 50 MG TABS (TRAZODONE HCL) 1-2  tabs by mouth at bedtime as needed for insomnia  #30 x 3   Entered and Authorized by:   Michell Heinrich M.D.   Signed by:   Michell Heinrich M.D. on 02/18/2011   Method used:   Electronically to        [facility] [address] [phone]* (retail)       [address] [place], [place]  [address]       Ph: [phone]       Fax: [phone]   RxID:   [id number]    Orders Added: 1)  Cardiology Referral [Cardiology] 2)  Est. Patient Level IV [44010]

## 2011-02-24 NOTE — Miscellaneous (Signed)
  Clinical Lists Changes  Medications: Removed medication of TRADJENTA 5 MG TABS (LINAGLIPTIN) 1 tab by mouth qAM Removed medication of ZITHROMAX Z-PAK 250 MG TABS (AZITHROMYCIN) as directed Added new medication of LANTUS SOLOSTAR 100 UNIT/ML SOLN (INSULIN GLARGINE) 10 U Subcutaneously at bedtime --titrating Changed medication from METFORMIN HCL 1000 MG TABS (METFORMIN HCL) 1tab by mouth bid to METFORMIN HCL 500 MG TABS (METFORMIN HCL) 1 tab by mouth bid     Prior Medications: KLS ALLER-TEC 10 MG TABS (CETIRIZINE HCL) Take 1 tablet by mouth once a day CRESTOR 20 MG TABS (ROSUVASTATIN CALCIUM) 1 tab by mouth qd PHENTERMINE HCL 37.5 MG TABS (PHENTERMINE HCL) Take 1 tablet by mouth once a day HYDROCHLOROTHIAZIDE 25 MG TABS (HYDROCHLOROTHIAZIDE) Take 1 tablet by mouth once a day ADVAIR HFA 115-21 MCG/ACT AERO (FLUTICASONE-SALMETEROL) 2 puffs bid HYDROCODONE-HOMATROPINE 5-1.5 MG/5ML SYRP (HYDROCODONE-HOMATROPINE) 1-2 tsp q6h as needed for cough ONE TOUCH ULTRA GLUCOMETER STRIPS () check glucose twice daily Current Allergies: No known allergies

## 2011-02-24 NOTE — Miscellaneous (Signed)
  Clinical Lists Changes  Medications: Changed medication from METFORMIN HCL 500 MG TABS (METFORMIN HCL) 1 tab by mouth bid to METFORMIN HCL 1000 MG TABS (METFORMIN HCL) 1 tab by mouth bid

## 2011-02-28 ENCOUNTER — Encounter (INDEPENDENT_AMBULATORY_CARE_PROVIDER_SITE_OTHER): Payer: Self-pay | Admitting: *Deleted

## 2011-03-01 NOTE — Miscellaneous (Signed)
  Clinical Lists Changes  Observations: Added new observation of HPI: Reviewed records from Dr. Virginia Rochester today (GI). Patient needs repeat colonoscopy for f/u adenomatous colon polyps 2007. I also need to clarify his PSH with him (appendectomy?.....surgical clips noted in RLQ on a CT scan 2008). (02/24/2011 17:02) Added new observation of PAST MED HX: DM 2, insulin-requiring (initial dx approx 2010). Hyperlipidemia Hx of Hemorrhoids Hospitalized for diverticulitis 11/2006 Diverticulosis Adenomatous colon polyps (04/2006 Dr. Virginia Rochester.  Rpt in 3 yrs was recommended) GERD w/esophagitis (EGD 04/2006) Abnormal EKG 01/2011 Benign left renal cyst   (02/24/2011 17:02)      History of Present Illness: Reviewed records from Dr. Virginia Rochester today (GI). Patient needs repeat colonoscopy for f/u adenomatous colon polyps 2007. I also need to clarify his PSH with him (appendectomy?.....surgical clips noted in RLQ on a CT scan 2008).   Past History:  Past Medical History: DM 2, insulin-requiring (initial dx approx 2010). Hyperlipidemia Hx of Hemorrhoids Hospitalized for diverticulitis 11/2006 Diverticulosis Adenomatous colon polyps (04/2006 Dr. Virginia Rochester.  Rpt in 3 yrs was recommended) GERD w/esophagitis (EGD 04/2006) Abnormal EKG 01/2011 Benign left renal cyst   History of Present Illness: Reviewed records from Dr. Virginia Rochester today (GI). Patient needs repeat colonoscopy for f/u adenomatous colon polyps 2007. I also need to clarify his PSH with him (appendectomy?.....surgical clips noted in RLQ on a CT scan 2008).

## 2011-03-08 NOTE — Letter (Signed)
Summary: Sabino Gasser MD  Sabino Gasser MD   Imported By: Lester Weeki [place] 03/04/2011 07:22:45  _____________________________________________________________________  External Attachment:    Type:   Image     Comment:   External Document

## 2011-03-08 NOTE — Op Note (Signed)
Summary: Colon: Dr. Virginia Rochester  NAME:  Shaun Knight, Shaun Knight                 ACCOUNT NO.:  [id number]   MEDICAL RECORD NO.:  [id number]          PATIENT TYPE:  AMB   LOCATION:  ENDO                         FACILITY:  [facility]   PHYSICIAN:  Georgiana Spinner, M.D.    DATE OF BIRTH:  07-05-1951   DATE OF PROCEDURE:  04/20/2006  DATE OF DISCHARGE:                                 OPERATIVE REPORT   PROCEDURE:  Colonoscopy.   INDICATIONS:  Colon polyps.   ANESTHESIA:  Propofol.   PROCEDURE:  With the patient mildly sedated in the left lateral decubitus  position.  A rectal examination was performed which was unremarkable.  Subsequently the Olympus videoscopic colonoscope was inserted into the  rectum, passed under direct vision to the cecum identified by ileocecal  valve and appendiceal orifice.  In the cecum was a polyp, flat, sessile,  multilobulated.  This was photographed and first biopsied and then  subsequently eradicated using ERBE the argon photocoagulator.  Once the  tissue had been eradicated to my satisfaction and photographed, the  colonoscope was slowly withdrawn taking circumferential views of colonic  mucosa stopping next in the ascending colon near the hepatic flexure where a  second polyp was seen.  It, too, was photographed and it was removed using  snare cautery technique.  Initially cold but subsequently we used hot biopsy  forceps to cauterize the remainder.  The endoscope was then withdrawn all  the way to the rectum, stopping in the descending colon where a third polyp  was seen and it too was removed using hot biopsy forceps technique, setting  of 20/150 blended current.  The endoscope was withdrawn to the rectum which  appeared normal on direct and showed hemorrhoids on retroflexed view.  The  endoscope was straightened and withdrawn.  The patient's vital signs, pulse  oximeter remained stable.  The patient tolerated procedure well without  apparent complications.   FINDINGS:   Polyps of cecum, ascending colon, descending colon as described  above.   PLAN:  Await biopsy reports.  The patient will call me for results and  follow-up with me as an outpatient.  Will avoid aspirin, Advil, ibuprofen  products, etc. for the next two weeks.           ______________________________  Georgiana Spinner, M.D.     GMO/MEDQ  D:  04/20/2006  T:  04/21/2006  Job:  [id number]   cc:   Othelia Pulling, M.D.  Fax: [phone]

## 2011-03-08 NOTE — Letter (Signed)
Summary: Sabino Gasser MD  Sabino Gasser MD   Imported By: Lester [place] 03/04/2011 07:21:34  _____________________________________________________________________  External Attachment:    Type:   Image     Comment:   External Document

## 2011-03-08 NOTE — Letter (Signed)
Summary: Sabino Gasser MD  Sabino Gasser MD   Imported By: Lester [place] 03/04/2011 07:19:32  _____________________________________________________________________  External Attachment:    Type:   Image     Comment:   External Document

## 2011-03-08 NOTE — Letter (Signed)
Summary: Sabino Gasser MD  Sabino Gasser MD   Imported By: Lester [place] 03/04/2011 07:20:34  _____________________________________________________________________  External Attachment:    Type:   Image     Comment:   External Document

## 2011-03-08 NOTE — Op Note (Signed)
Summary: EGD: Dr. Virginia Rochester (exam date 04/20/06)  NAME:  Shaun Knight, Shaun Knight                 ACCOUNT NO.:  [id number]   MEDICAL RECORD NO.:  [id number]          PATIENT TYPE:  AMB   LOCATION:  ENDO                         [facility]ACILITY:  [facility]   PHYSICIAN:  Georgiana Spinner, M.D.    DATE OF BIRTH:  October 25, 1951   DATE OF PROCEDURE:  04/20/2006  DATE OF DISCHARGE:                                 OPERATIVE REPORT   PROCEDURE:  Upper endoscopy.   INDICATIONS:  GERD.   ANESTHESIA:  Propofol.   PROCEDURE:  With the patient mildly sedated in the left lateral decubitus  position, the Olympus videoscopic endoscope was inserted and passed under  direct vision through the esophagus which appeared normal until we reached  distal esophagus there were changes of esophagitis moderately severe with  ulcerations seen.  Photographs and biopsies taken.  We entered into the  stomach.  Fundus, body, antrum, duodenal bulb, second portion duodenum  appeared normal.  From this point the endoscope was slowly withdrawn taking  circumferential views of duodenal mucosa until the endoscope had been pulled  back into the stomach, placed in retroflexion to view the stomach from  below, the endoscope was then straightened, withdrawn taking circumferential  views remaining gastric and esophageal mucosa.  The patient's vital signs,  pulse oximeter remained stable.  The patient tolerated procedure well  without apparent complications.   FINDINGS:  Esophagitis moderately severe.   PLAN:  Await biopsy report.  Will start the patient on PPI therapy and have  patient follow-up with me as an outpatient.           ______________________________  Georgiana Spinner, M.D.     GMO/MEDQ  D:  04/20/2006  T:  04/21/2006  Job:  [id number]   cc:   Othelia Pulling, M.D.  Fax: [phone]

## 2011-03-16 ENCOUNTER — Encounter: Payer: Self-pay | Admitting: Family Medicine

## 2011-03-17 ENCOUNTER — Ambulatory Visit (INDEPENDENT_AMBULATORY_CARE_PROVIDER_SITE_OTHER): Payer: BC Managed Care – PPO | Admitting: Family Medicine

## 2011-03-17 ENCOUNTER — Ambulatory Visit: Payer: BC Managed Care – PPO | Admitting: Family Medicine

## 2011-03-17 ENCOUNTER — Encounter: Payer: Self-pay | Admitting: Family Medicine

## 2011-03-17 VITALS — BP 108/74 | HR 82 | Temp 98.3°F | Ht 70.0 in | Wt 203.4 lb

## 2011-03-17 DIAGNOSIS — B070 Plantar wart: Secondary | ICD-10-CM

## 2011-03-17 DIAGNOSIS — E119 Type 2 diabetes mellitus without complications: Secondary | ICD-10-CM

## 2011-03-17 MED ORDER — INSULIN GLARGINE 100 UNIT/ML ~~LOC~~ SOLN: SUBCUTANEOUS | Status: DC

## 2011-03-17 MED ORDER — HYDROCHLOROTHIAZIDE 25 MG PO TABS: 25.0000 mg | ORAL_TABLET | Freq: Every day | ORAL | Status: DC

## 2011-03-17 MED ORDER — METFORMIN HCL 1000 MG PO TABS: 1000.0000 mg | ORAL_TABLET | Freq: Two times a day (BID) | ORAL | Status: DC

## 2011-03-17 NOTE — Assessment & Plan Note (Signed)
I shaved the callus at the site with a scalpel today, then did cryotherapy on the wart for two freeze/thaw cycles of 20 seconds each.

## 2011-03-17 NOTE — Progress Notes (Addendum)
Subjective:    Patient ID: Shaun Knight, male    DOB: [birth year], 60 y.o.   MRN: [id number]  HPI 60 y/o WM here for DM 2 f/u---uncontrolled, HbA1c was 13.7% about 5-6 wks ago.  Started lantus and is titrating but is not aggressive.  Glucoses 130s fasting at their lowest, same later in day, with rare reading of 200 or so.  Nocturia is GONE. He feels well, energy up.  Adhering to diabetic diet majority of time but still not exercising. Says right shoulder bothering him a lot still at night, pain keeps him from sleeping.  Says he got cortisone inj in 2 consec mo at [place] [facility] recently.  Was told to f/u and likely get MRI if still not improved, so he plans on going there is a few weeks when things with his company calm down a bit. No tingling or numbness in feet, but c/o right plantar pain focally at the site of callus/wart.  I have shaved this area down in the past and it brought relief for a month or so. No vision complaints.  Says he has no ophthalmologist, last acuity testing 3 yrs ago.  Has never had D.R. Screening per his report today.  Past Medical History  Diagnosis Date  . Diabetes mellitus   . Hyperlipidemia   . History of hemorrhoids   . Diverticulosis of colon   . Diverticulitis     hospitalized  . Adenomatous colon polyp 2008    Dr. Virginia Rochester  . Shoulder pain, right      Injection on 2 consec mo at [facility] (Dr. August Saucer) 2012    Past Surgical History  Procedure Date  . Hemorrhoid surgery     Family History  Problem Relation Age of Onset  . Diabetes Mother   . Hypertension Mother   . Coronary artery disease Mother   . Diabetes Father   . Hypertension Father   . Coronary artery disease Father   . Lymphoma Father     non-hodgkin  . Obesity Daughter   . Cancer Paternal Grandmother     unknown    History   Social History  . Marital Status: Married    Spouse Name: N/A    Number of Children: N/A  . Years of Education: N/A   Occupational History  . Not on file.     Social History Main Topics  . Smoking status: Never Smoker   . Smokeless tobacco: Never Used  . Alcohol Use: No  . Drug Use: No  . Sexually Active: Yes -- Male partner(s)   Other Topics Concern  . Not on file   Social History Narrative  . No narrative on file    Current Outpatient Prescriptions  Medication Sig Dispense Refill  . cetirizine (ZYRTEC) 10 MG tablet Take 10 mg by mouth daily.        . phentermine 37.5 MG capsule Take 37.5 mg by mouth daily.        . rosuvastatin (CRESTOR) 10 MG tablet Take 10 mg by mouth daily.        . fluticasone-salmeterol (ADVAIR HFA) 115-21 MCG/ACT inhaler Inhale 2 puffs into the lungs 2 (two) times daily.        . hydrochlorothiazide 25 MG tablet Take 1 tablet (25 mg total) by mouth daily.  30 tablet  6  . insulin glargine (LANTUS SOLOSTAR) 100 UNIT/ML injection 10 units SQ qhs and titrate up by 1 unit per night until fasting glucose is in 100-110 range consistently  3 mL  6  . metFORMIN (GLUCOPHAGE) 1000 MG tablet Take 1 tablet (1,000 mg total) by mouth 2 (two) times daily with a meal.  60 tablet  11  . DISCONTD: hydrochlorothiazide 25 MG tablet Take 25 mg by mouth daily.        Marland Kitchen DISCONTD: hydrochlorothiazide 25 MG tablet Take 1 tablet (25 mg total) by mouth daily.  30 tablet  6  . DISCONTD: Linagliptin (TRADJENTA) 5 MG TABS Take 1 tablet by mouth every morning.        Marland Kitchen DISCONTD: metFORMIN (GLUCOPHAGE) 500 MG tablet Take 500 mg by mouth 2 (two) times daily.           No Known Allergies    Review of Systems  Constitutional: Negative for fever and fatigue.  HENT: Negative for congestion and sore throat.   Eyes: Negative for visual disturbance.  Respiratory: Negative for cough.   Cardiovascular: Negative for chest pain.  Gastrointestinal: Negative for nausea and abdominal pain.  Genitourinary: Negative for dysuria.  Musculoskeletal: Negative for back pain and joint swelling.  Skin: Negative for rash.  Neurological: Negative for  weakness and headaches.  Hematological: Negative for adenopathy.       Objective:   Physical Exam VS: noted, all normal. Gen: Alert, well appearing, oriented x 4. FEET: warm, pink, without rash or edema.  DP pulses 2+ BILAT. Right plantar surface with small callus with a central plantar wart--located over 5th metatarsal head area.  No erythema.  Point tenderness on the wart.       Assessment & Plan:  DM Control improving.  Needs to get more aggressive with lantus titration, and we discussed this today. We'll repeat HbA1c and urine micro/cr ratio in 6 wks. I'll refer him to ophtho for D.R screening.  PLANTAR WART I shaved the callus at the site with a scalpel today, then did cryotherapy on the wart for two freeze/thaw cycles of 20 seconds each.

## 2011-03-17 NOTE — Assessment & Plan Note (Signed)
Control improving.  Needs to get more aggressive with lantus titration, and we discussed this today. We'll repeat HbA1c and urine micro/cr ratio in 6 wks. I'll refer him to ophtho for D.R screening.

## 2011-03-18 ENCOUNTER — Encounter: Payer: Self-pay | Admitting: Family Medicine

## 2011-03-18 MED ORDER — ROSUVASTATIN CALCIUM 10 MG PO TABS: 10.0000 mg | ORAL_TABLET | Freq: Every day | ORAL | Status: DC

## 2011-03-18 MED ORDER — INSULIN GLARGINE 100 UNIT/ML ~~LOC~~ SOLN: SUBCUTANEOUS | Status: DC

## 2011-03-18 NOTE — Progress Notes (Signed)
Addended by: Nicoletta Ba on: 03/18/2011 11:28 AM   Modules accepted: Orders

## 2011-05-03 NOTE — Op Note (Signed)
[name], [name] NO.:  [id number]   MEDICAL RECORD NO.:  [id number]          PATIENT TYPE:  AMB   LOCATION:  ENDO                         FACILITY:  [facility]   PHYSICIAN:  Georgiana Spinner, M.D.    DATE OF BIRTH:  1951/02/18   DATE OF PROCEDURE:  07/11/2007  DATE OF DISCHARGE:                               OPERATIVE REPORT   PROCEDURE:  Upper endoscopy.   INDICATIONS:  Abdominal pain.   ANESTHESIA:  Fentanyl 50 mcg, Versed 6 mg.   PROCEDURE:  With the patient mildly sedated in the left lateral  decubitus position the Pentax videoscopic endoscope was inserted in the  mouth passed under direct vision through the esophagus which appeared  normal until we reached distal esophagus and there was one area where  the squamocolumnar junction was somewhat indistinct and possibly  consistent with Barrett's esophagus so I elected to photograph and  biopsy this area.  We then entered into the stomach.  Fundus, body,  antrum, duodenal bulb, second portion duodenum were visualized.  From  this point the endoscope was slowly withdrawn taking circumferential  views of duodenal mucosa until the endoscope had been pulled back in the  stomach, placed in retroflexion to view the stomach from below.  The  endoscope was straightened and withdrawn taking circumferential views of  remaining gastric and esophageal mucosa.  The patient's vital signs,  pulse oximeter remained stable.  The patient tolerated procedure well  without apparent complications.   FINDINGS:  Question of Barrett's esophagus, otherwise unremarkable  examination.   IMPRESSION:  Cause of abdominal pain unclear.  The patient describes a  bulge after eating.  Whether this is abdominal wall hernia although I  cannot feel that at the present time, possibly in getting a lateral or  tangential abdominal x-ray when this occurs to see if there is air in  that area might be helpful and I suggest that the patient seek  medical  attention when that should occur.           ______________________________  Georgiana Spinner, M.D.     GMO/MEDQ  D:  07/11/2007  T:  07/11/2007  Job:  [id number]   cc:   Joycelyn Rua, M.D.  Fax: [phone]

## 2011-05-06 NOTE — Op Note (Signed)
NAMEBURLE, KWAN NO.:  [id number]   MEDICAL RECORD NO.:  [id number]          PATIENT TYPE:  AMB   LOCATION:  ENDO                         FACILITY:  [facility]   PHYSICIAN:  Georgiana Spinner, M.D.    DATE OF BIRTH:  October 13, 1951   DATE OF PROCEDURE:  04/20/2006  DATE OF DISCHARGE:                                 OPERATIVE REPORT   PROCEDURE:  Colonoscopy.   INDICATIONS:  Colon polyps.   ANESTHESIA:  Propofol.   PROCEDURE:  With the patient mildly sedated in the left lateral decubitus  position.  A rectal examination was performed which was unremarkable.  Subsequently the Olympus videoscopic colonoscope was inserted into the  rectum, passed under direct vision to the cecum identified by ileocecal  valve and appendiceal orifice.  In the cecum was a polyp, flat, sessile,  multilobulated.  This was photographed and first biopsied and then  subsequently eradicated using ERBE the argon photocoagulator.  Once the  tissue had been eradicated to my satisfaction and photographed, the  colonoscope was slowly withdrawn taking circumferential views of colonic  mucosa stopping next in the ascending colon near the hepatic flexure where a  second polyp was seen.  It, too, was photographed and it was removed using  snare cautery technique.  Initially cold but subsequently we used hot biopsy  forceps to cauterize the remainder.  The endoscope was then withdrawn all  the way to the rectum, stopping in the descending colon where a third polyp  was seen and it too was removed using hot biopsy forceps technique, setting  of 20/150 blended current.  The endoscope was withdrawn to the rectum which  appeared normal on direct and showed hemorrhoids on retroflexed view.  The  endoscope was straightened and withdrawn.  The patient's vital signs, pulse  oximeter remained stable.  The patient tolerated procedure well without  apparent complications.   FINDINGS:  Polyps of cecum, ascending  colon, descending colon as described  above.   PLAN:  Await biopsy reports.  The patient will call me for results and  follow-up with me as an outpatient.  Will avoid aspirin, Advil, ibuprofen  products, etc. for the next two weeks.           ______________________________  Georgiana Spinner, M.D.     GMO/MEDQ  D:  04/20/2006  T:  04/21/2006  Job:  [id number]   cc:   Othelia Pulling, M.D.  Fax: [phone]

## 2011-05-06 NOTE — H&P (Signed)
Shaun Knight, Shaun Knight NO.:  [id number]   MEDICAL RECORD NO.:  [id number]          PATIENT TYPE:  EMS   LOCATION:  MAJO                         FACILITY:  [facility]   PHYSICIAN:  Melissa L. Ladona Ridgel, MD  DATE OF BIRTH:  August 15, 1951   DATE OF ADMISSION:  12/16/2006  DATE OF DISCHARGE:                              HISTORY & PHYSICAL   CHIEF COMPLAINT:  Abdominal pain.   PRIMARY CARE PHYSICIAN:  Unassigned.  His current physician is Othelia Pulling, M.D. who is currently going into retirement.   HISTORY OF PRESENT ILLNESS:  The patient is a 60 year old white male who  returned from [facility] on Wednesday, where he was at a conference.  He developed an acute onset of central abdominal pain this morning and  evidently took a hydrocodone in order to get him back to [place].  On  arrival in [place], he continued to have abdominal pain and  discomfort.  He thought maybe it was a kidney stone.  The patient came  to the emergency room for further evaluation and up to that point did  not have nausea or vomiting but here in the emergency room after  treatment with antibiotics for what was found to be diverticulitis on CT  scan the patient did have excessive nausea and vomiting, multiple  events.   REVIEW OF SYSTEMS:  He had no fever, chills, nausea, vomiting prior to  admission but in the emergency room as stated did have emesis.  His last  bowel movement was this morning.  It was formed.  Denied melena,  hematochezia.  There were no constitutional symptoms or weight loss.  All other review of systems are negative.   PAST MEDICAL HISTORY:  1. He was diagnosed with hypertension before but it has been okay off      medication.  2. He also was diagnosed with increased cholesterol but he is on no      medication for this.   PAST SURGICAL HISTORY:  1. He had a hemorrhoid surgery.  2. Sinus surgery x2.   SOCIAL HISTORY:  He does not smoke.  He does not drink.  He is a  Editor, commissioning.  He is married with a son and a daughter.   FAMILY HISTORY:  Mom is living with cancer of the bladder.  Dad is  deceased with non-Hodgkin's lymphoma, diabetes, and a CABG.   He is currently taking no medications.   PHYSICAL EXAMINATION:  VITAL SIGNS:  Temperature is 97.5, blood pressure  121/72, pulse 64, respirations 18, saturation 94%.  GENERAL:  This is a well developed, moderately obese, white male in no  acute distress.  HEENT:  He is normocephalic atraumatic.  Pupils are equal, round, and  reactive to light.  Extraocular muscles are intact.  Mucous membranes  are moist.  NECK:  Supple.  There in JVD.  There are no carotid bruits.  CHEST:  Clear to auscultation.  There are no rhonchi, rales, or wheezes.  CARDIOVASCULAR:  Regular rate and rhythm.  Positive S1 S2.  No S3, S4.  No murmurs,  rubs, or gallops.  ABDOMEN:  Soft but tender in the periumbilical and lower quadrants of  the abdomen.  He is not guarding.  He does not have rebound, and he does  have positive slow bowel sounds.  EXTREMITIES:  Show no clubbing, cyanosis, or edema.  NEUROLOGIC:  He is awake, alert, slightly slow with his cognitive  processes because of the pain medication that he received, otherwise is  able to give me a reasonable history.  Power is 5/5.  DTRs are 2.   LABORATORY:  Urinalysis is negative.  His white count is 7.5, hemoglobin  15.2, hematocrit 44.2, and platelets are 179.  Sodium is 136, potassium  3.8, chloride is 101, CO2 is 25, BUN 12, creatinine is 0.8, and glucose  is 129.  His AST is 38, his ALT is 82.  CT scan reveals a right adrenal  adenoma.  He does have a left complex renal lesion which could be a  cyst, however, further imaging should be undertaken as an outpatient to  assure that is the likely cause.  Proximal sigmoid stranding is noted,  localized around an area of the sigmoid colon where there is wall  thickening consistent with diverticulitis.  The pelvic CT  also shows  __________  defect in L5 with a 5-ml anterolisthesis and that is between  L5-S1.  He has got a clip in his right lower quadrant of unclear  etiology.  It may be related to his hemorrhoid surgery.   ASSESSMENT:  This is a 60 year old white male with an acute onset of  abdominal pain further associated with nausea and vomiting.  A CT scan  confirmed sigmoid diverticulitis.  The patient is unable to keep pills  down secondary to nausea and vomiting.  He will therefore be admitted  for further intravenous hydration and antibiotics.   PLAN:  1. Cardiovascular.  He is hemodynamically stable.  I see no need for      telemetry at this time.  2. Pulmonary.  He has no complaints or issues.  3. GI. Diverticulitis We will IV Cipro, IV Flagyl with hydration.  4. GU. We will monitor his I's and O's.  5. Endocrine.  He has no history of diabetes.  No further workup is      needed.  We, however, could check his lipid panel, although his      physician recently did that and therefore, I will not duplicate the      exam.  6. DVT prophylaxis.  For now we will use SCDs.      Melissa L. Ladona Ridgel, MD  Electronically Signed     MLT/MEDQ  D:  12/16/2006  T:  12/16/2006  Job:  [id number]   cc:   Othelia Pulling, M.D.

## 2011-05-06 NOTE — Op Note (Signed)
NAMEJAYON, Shaun Knight NO.:  [id number]   MEDICAL RECORD NO.:  [id number]          PATIENT TYPE:  AMB   LOCATION:  ENDO                         FACILITY:  [facility]   PHYSICIAN:  Georgiana Spinner, M.D.    DATE OF BIRTH:  11-25-1951   DATE OF PROCEDURE:  04/20/2006  DATE OF DISCHARGE:                                 OPERATIVE REPORT   PROCEDURE:  Upper endoscopy.   INDICATIONS:  GERD.   ANESTHESIA:  Propofol.   PROCEDURE:  With the patient mildly sedated in the left lateral decubitus  position, the Olympus videoscopic endoscope was inserted and passed under  direct vision through the esophagus which appeared normal until we reached  distal esophagus there were changes of esophagitis moderately severe with  ulcerations seen.  Photographs and biopsies taken.  We entered into the  stomach.  Fundus, body, antrum, duodenal bulb, second portion duodenum  appeared normal.  From this point the endoscope was slowly withdrawn taking  circumferential views of duodenal mucosa until the endoscope had been pulled  back into the stomach, placed in retroflexion to view the stomach from  below, the endoscope was then straightened, withdrawn taking circumferential  views remaining gastric and esophageal mucosa.  The patient's vital signs,  pulse oximeter remained stable.  The patient tolerated procedure well  without apparent complications.   FINDINGS:  Esophagitis moderately severe.   PLAN:  Await biopsy report.  Will start the patient on PPI therapy and have  patient follow-up with me as an outpatient.           ______________________________  Georgiana Spinner, M.D.     GMO/MEDQ  D:  04/20/2006  T:  04/21/2006  Job:  [id number]   cc:   Othelia Pulling, M.D.  Fax: [phone]

## 2011-05-25 ENCOUNTER — Encounter: Payer: Self-pay | Admitting: Family Medicine

## 2011-10-12 ENCOUNTER — Encounter: Payer: Self-pay | Admitting: Family Medicine

## 2011-10-12 ENCOUNTER — Ambulatory Visit (INDEPENDENT_AMBULATORY_CARE_PROVIDER_SITE_OTHER): Payer: BC Managed Care – PPO | Admitting: Family Medicine

## 2011-10-12 VITALS — BP 121/84 | HR 73 | Temp 97.8°F | Ht 70.0 in | Wt 206.8 lb

## 2011-10-12 DIAGNOSIS — E119 Type 2 diabetes mellitus without complications: Secondary | ICD-10-CM

## 2011-10-12 DIAGNOSIS — E785 Hyperlipidemia, unspecified: Secondary | ICD-10-CM

## 2011-10-12 DIAGNOSIS — B070 Plantar wart: Secondary | ICD-10-CM

## 2011-10-12 LAB — GLUCOSE, POCT (MANUAL RESULT ENTRY): POC Glucose: 157

## 2011-10-12 LAB — MICROALBUMIN / CREATININE URINE RATIO: Microalb, Ur: 1.2 mg/dL (ref 0.0–1.9)

## 2011-10-12 LAB — HEMOGLOBIN A1C: Hgb A1c MFr Bld: 7.3 % — ABNORMAL HIGH (ref 4.6–6.5)

## 2011-10-12 LAB — COMPREHENSIVE METABOLIC PANEL
Albumin: 4.5 g/dL (ref 3.5–5.2)
Alkaline Phosphatase: 30 U/L — ABNORMAL LOW (ref 39–117)
BUN: 19 mg/dL (ref 6–23)
CO2: 30 mEq/L (ref 19–32)
Calcium: 9 mg/dL (ref 8.4–10.5)
GFR: 81.89 mL/min (ref >60.00)
Glucose, Bld: 141 mg/dL — ABNORMAL HIGH (ref 70–99)
Potassium: 3.8 mEq/L (ref 3.5–5.1)

## 2011-10-12 LAB — LIPID PANEL
Cholesterol: 179 mg/dL (ref 0–200)
VLDL: 34.4 mg/dL (ref 0.0–40.0)

## 2011-10-12 MED ORDER — ZOSTER VACCINE LIVE 19400 UNT/0.65ML ~~LOC~~ SOLR: 0.6500 mL | Freq: Once | SUBCUTANEOUS | Status: DC

## 2011-10-12 NOTE — Progress Notes (Signed)
OFFICE NOTE  10/12/2011  CC:  Chief Complaint  Patient presents with  . Foot Pain     HPI:   Patient is a 60 y.o. Caucasian male who is here for f/u DM 2 and hyperlipidemia plus to get a callous shaved off of foot. Painful/bothersome area on bottom of right foot near head of 5th metatarsal.  I've shaved this area in the past and cryo'd a plantar wart that I exposed beneath it.   Reports that lantus was never bringing his glucoses down consistently below 200 so he just stopped it.  He continued metformin.   He checks his sugar about 1 time per month maximum, says it has been around 150 each time. Compliant with crestor, phentermine, and HCTZ. Says he feels well.  Denies burning, tingling, or numbness in feet or hands. Denies polyuria, polydipsia, or polyphagia. Says glucose and "many other labs" were done at a health fair at a local [place] recently and he says his glucose was 95, and lipids were "normal".  Pertinent PMH:  DM 2, hx of poor control and noncompliance. Hyperlipidemia Plantar wart  MEDS;   Outpatient Prescriptions Prior to Visit  Medication Sig Dispense Refill  . cetirizine (ZYRTEC) 10 MG tablet Take 10 mg by mouth daily.       . hydrochlorothiazide 25 MG tablet Take 1 tablet (25 mg total) by mouth daily.  30 tablet  6  . metFORMIN (GLUCOPHAGE) 1000 MG tablet Take 1 tablet (1,000 mg total) by mouth 2 (two) times daily with a meal.  60 tablet  11  . phentermine 37.5 MG capsule Take 37.5 mg by mouth daily.        . rosuvastatin (CRESTOR) 10 MG tablet Take 1 tablet (10 mg total) by mouth daily.  30 tablet  6  . fluticasone-salmeterol (ADVAIR HFA) 115-21 MCG/ACT inhaler Inhale 2 puffs into the lungs 2 (two) times daily.        . insulin glargine (LANTUS SOLOSTAR) 100 UNIT/ML injection 10 units SQ qhs and titrate up by 1 unit per night until fasting glucose is in 100-110 range consistently  3 mL  6    PE: Blood pressure 121/84, pulse 73, temperature 97.8 F (36.6 C),  temperature source Oral, height 5\' 10"  (1.778 m), weight 206 lb 12.8 oz (93.804 kg), SpO2 97.00%. Gen: Alert, well appearing.  Patient is oriented to person, place, time, and situation. Right foot plantar surface near 5th metatarsal head has mildly firm callous, mildly uncomfortable with pressure/palpation.   No erythema, no warmth, no other areas of callous.  Sensation is intact.  IMPRESSION AND PLAN:  DM Control has been poor in the recent past. No significant BS monitoring at home lately. Noncompliant with lantus. Will check HbA1c today. Will send another urine microalb/cr today (this was 29 at last check this spring) and if still borderline or high then will start ACE-I. He is UTD on eye exam and foot exam.  HYPERLIPIDEMIA Check lipid panel today (? Fasting? Unsure). These were elevated last check this spring but he says recent health fair testing showed normal lipids. Unfortunately he doesn't think he kept the paperwork from this health fair.  PLANTAR WART Plantar callus shaved today with #10 scalpel after betadine applied.  Exposed small plantar wart that I applied cryotherapy to for 45 seconds. Pt tolerated procedure well.  No immediate complications.  Silver nitrate stick used to cauterize one area that bled after I shave down a bit too deep at the border of his  callus. Wound care discussed.   Zostavax rx sent to pt's pharmacy today. I recommended he return for pneumovax and TdaP at his convenience or he can get these at office f/u visits.  FOLLOW UP:  Return in about 4 months (around 02/12/2012) for f/u dm 2.

## 2011-10-12 NOTE — Assessment & Plan Note (Signed)
Check lipid panel today (? Fasting? Unsure). These were elevated last check this spring but he says recent health fair testing showed normal lipids. Unfortunately he doesn't think he kept the paperwork from this health fair.

## 2011-10-12 NOTE — Assessment & Plan Note (Signed)
Control has been poor in the recent past. No significant BS monitoring at home lately. Noncompliant with lantus. Will check HbA1c today. Will send another urine microalb/cr today (this was 29 at last check this spring) and if still borderline or high then will start ACE-I. He is UTD on eye exam and foot exam.

## 2011-10-12 NOTE — Assessment & Plan Note (Signed)
Plantar callus shaved today with #10 scalpel after betadine applied.  Exposed small plantar wart that I applied cryotherapy to for 45 seconds. Pt tolerated procedure well.  No immediate complications.  Silver nitrate stick used to cauterize one area that bled after I shave down a bit too deep at the border of his callus. Wound care discussed.

## 2011-12-02 ENCOUNTER — Encounter: Payer: Self-pay | Admitting: Family Medicine

## 2011-12-02 ENCOUNTER — Ambulatory Visit (INDEPENDENT_AMBULATORY_CARE_PROVIDER_SITE_OTHER): Payer: BC Managed Care – PPO | Admitting: Family Medicine

## 2011-12-02 DIAGNOSIS — G4700 Insomnia, unspecified: Secondary | ICD-10-CM

## 2011-12-02 DIAGNOSIS — B070 Plantar wart: Secondary | ICD-10-CM

## 2011-12-02 DIAGNOSIS — E119 Type 2 diabetes mellitus without complications: Secondary | ICD-10-CM

## 2011-12-02 DIAGNOSIS — M25511 Pain in right shoulder: Secondary | ICD-10-CM

## 2011-12-02 DIAGNOSIS — M25519 Pain in unspecified shoulder: Secondary | ICD-10-CM

## 2011-12-02 MED ORDER — TRAZODONE HCL 50 MG PO TABS: ORAL_TABLET | ORAL | Status: AC

## 2011-12-02 MED ORDER — IBUPROFEN 600 MG PO TABS: ORAL_TABLET | ORAL | Status: DC

## 2011-12-04 ENCOUNTER — Encounter: Payer: Self-pay | Admitting: Family Medicine

## 2011-12-04 DIAGNOSIS — M25511 Pain in right shoulder: Secondary | ICD-10-CM | POA: Insufficient documentation

## 2011-12-04 DIAGNOSIS — G4700 Insomnia, unspecified: Secondary | ICD-10-CM | POA: Insufficient documentation

## 2011-12-04 NOTE — Progress Notes (Signed)
OFFICE NOTE  12/04/2011  CC:  Chief Complaint  Patient presents with  . Foot Problem     HPI:   Patient is a 60 y.o. Caucasian male who is here for right foot plantar pain. Hx of callous and wart, frozen in not-to-distant-past.  Has returned some and is bothersome again, wants it frozen/shaved again. Says he is not checking glucoses.  Feels good: no polyuria, polydipsia, CP, SOB, or HA/vision problems. No sensory complaints in feet.  Says right shoulder hurts on and off still: tooth ache-like pain in deltoid area primarily, inhibits sleep some nights, sometimes various shoulder motions make it worse, sometimes they don't.  Occasionally 1-2 alleve help.  No neck pain, no radiation of the pain down his arm, no paresthesias.  No arm weakness. No malaise, no night sweats, no wt loss.  Insomnia really affecting him lately, has a lot on his mind with work and family lately, OTC sleep aids no help anymore. Sleep initiation and maintenance are problematic.  Denies RLS, and pain shoulder is not usually the problem.  Pertinent PMH:  Past Medical History  Diagnosis Date  . Diabetes mellitus   . Hyperlipidemia   . History of hemorrhoids   . Diverticulosis of colon   . Diverticulitis     hospitalized  . Adenomatous colon polyp 2008    Dr. Virginia Rochester  . Shoulder pain, right      Injection on 2 consec mo at [facility] (Dr. August Saucer) 2012  . History of ETT     LOW RISK/NEG ETT 02/2011 w/Dr. Deborah Chalk   Past Surgical History  Procedure Date  . Hemorrhoid surgery    Past family and social history reviewed and there are no changes since the patient's last office visit with me.  MEDS;  Currently taking zyrtec, HCTZ, phentermine, crestor, and metformin Outpatient Prescriptions Prior to Visit  Medication Sig Dispense Refill  . cetirizine (ZYRTEC) 10 MG tablet Take 10 mg by mouth daily.       . fluticasone-salmeterol (ADVAIR HFA) 115-21 MCG/ACT inhaler Inhale 2 puffs into the lungs 2 (two) times  daily.        . hydrochlorothiazide 25 MG tablet Take 1 tablet (25 mg total) by mouth daily.  30 tablet  6  . insulin glargine (LANTUS SOLOSTAR) 100 UNIT/ML injection 10 units SQ qhs and titrate up by 1 unit per night until fasting glucose is in 100-110 range consistently  3 mL  6  . metFORMIN (GLUCOPHAGE) 1000 MG tablet Take 1 tablet (1,000 mg total) by mouth 2 (two) times daily with a meal.  60 tablet  11  . phentermine 37.5 MG capsule Take 37.5 mg by mouth daily.        . rosuvastatin (CRESTOR) 10 MG tablet Take 1 tablet (10 mg total) by mouth daily.  30 tablet  6  . zoster vaccine live, PF, (ZOSTAVAX) 16109 UNT/0.65ML injection Inject 19,400 Units into the skin once.  1 vial  0    PE: Blood pressure 106/70, pulse 79, temperature 97.5 F (36.4 C), temperature source Oral, weight 211 lb (95.709 kg). Gen: Alert, well appearing.  Patient is oriented to person, place, time, and situation. Right plantar surface with visible plantar wart beneath a thin layer of callous. Right shoulder without palpable tenderness.  All ROM intact without pain or stiffness.  Normal UE strength, sensation, and DTRs.  Spurling's maneuver negative.  IMPRESSION AND PLAN:  DM Doing well. Lab Results  Component Value Date   HGBA1C 7.3* 10/12/2011  Continue metformin 1000 mg bid, diet, activity.  He has stopped all insulin.     PLANTAR WART Shaved small area of overlying callus with #10 scalpel. Cryotherapy applied to the area for 45 seconds x 2 freeze/thaw cycles.   Shoulder pain, right X-ray in the past has been unrevealing.  Steroid shots for RC arthrop or bursitis have not been helpful in the past per pt report. Ibuprofen 600mg  bid with food prn pain.   Insomnia Trial of trazodone 50mg , 1-2 tabs po qhs prn. Therapeutic expectations and side effect profile of medication discussed today.  Patient's questions answered.      FOLLOW UP:  Return if symptoms worsen or fail to improve.

## 2011-12-04 NOTE — Assessment & Plan Note (Signed)
Trial of trazodone 50mg , 1-2 tabs po qhs prn. Therapeutic expectations and side effect profile of medication discussed today.  Patient's questions answered.

## 2011-12-04 NOTE — Assessment & Plan Note (Signed)
Shaved small area of overlying callus with #10 scalpel. Cryotherapy applied to the area for 45 seconds x 2 freeze/thaw cycles.

## 2011-12-04 NOTE — Assessment & Plan Note (Signed)
X-ray in the past has been unrevealing.  Steroid shots for RC arthrop or bursitis have not been helpful in the past per pt report. Ibuprofen 600mg  bid with food prn pain.

## 2011-12-04 NOTE — Assessment & Plan Note (Signed)
Doing well. Lab Results  Component Value Date   HGBA1C 7.3* 10/12/2011   Continue metformin 1000 mg bid, diet, activity.  He has stopped all insulin.

## 2012-02-20 ENCOUNTER — Other Ambulatory Visit: Payer: Self-pay | Admitting: Family Medicine

## 2012-02-20 ENCOUNTER — Other Ambulatory Visit: Payer: Self-pay | Admitting: *Deleted

## 2012-02-20 DIAGNOSIS — E785 Hyperlipidemia, unspecified: Secondary | ICD-10-CM

## 2012-02-20 MED ORDER — ROSUVASTATIN CALCIUM 10 MG PO TABS: 10.0000 mg | ORAL_TABLET | Freq: Every day | ORAL | Status: DC

## 2012-02-20 NOTE — Telephone Encounter (Signed)
Pt due for follow up.  30 day sent.  Pt aware.

## 2012-02-22 ENCOUNTER — Other Ambulatory Visit (INDEPENDENT_AMBULATORY_CARE_PROVIDER_SITE_OTHER): Payer: BC Managed Care – PPO

## 2012-02-22 DIAGNOSIS — E785 Hyperlipidemia, unspecified: Secondary | ICD-10-CM

## 2012-02-22 LAB — LIPID PANEL: Total CHOL/HDL Ratio: 3

## 2012-03-19 ENCOUNTER — Other Ambulatory Visit: Payer: Self-pay | Admitting: Family Medicine

## 2012-03-20 NOTE — Telephone Encounter (Signed)
eScribe request for refill on Metformin  Last seen on 10/12/11 for DM follow up. Follow up needed in 4 months.  Pt overdue for follow up. RX sent for 30 days. I have attempted to contact this patient by phone with the following results: left message to return my call on answering machine (home).

## 2012-03-27 MED ORDER — ROSUVASTATIN CALCIUM 10 MG PO TABS: 10.0000 mg | ORAL_TABLET | Freq: Every day | ORAL | Status: DC

## 2012-03-27 NOTE — Telephone Encounter (Signed)
Faxed refill request received from pharmacy for Crestor Last seen on 10/12/11 for DM follow up.  Follow up needed in 4 months. Pt overdue for follow up.  RX sent for 30 days.

## 2012-03-29 ENCOUNTER — Encounter: Payer: Self-pay | Admitting: Family Medicine

## 2012-03-29 ENCOUNTER — Ambulatory Visit (INDEPENDENT_AMBULATORY_CARE_PROVIDER_SITE_OTHER): Payer: BC Managed Care – PPO | Admitting: Family Medicine

## 2012-03-29 VITALS — BP 143/95 | HR 73 | Ht 70.0 in | Wt 211.0 lb

## 2012-03-29 DIAGNOSIS — R109 Unspecified abdominal pain: Secondary | ICD-10-CM

## 2012-03-29 DIAGNOSIS — L84 Corns and callosities: Secondary | ICD-10-CM | POA: Insufficient documentation

## 2012-03-29 DIAGNOSIS — I10 Essential (primary) hypertension: Secondary | ICD-10-CM

## 2012-03-29 DIAGNOSIS — E119 Type 2 diabetes mellitus without complications: Secondary | ICD-10-CM

## 2012-03-29 DIAGNOSIS — Z87442 Personal history of urinary calculi: Secondary | ICD-10-CM

## 2012-03-29 DIAGNOSIS — E785 Hyperlipidemia, unspecified: Secondary | ICD-10-CM

## 2012-03-29 DIAGNOSIS — B070 Plantar wart: Secondary | ICD-10-CM

## 2012-03-29 LAB — COMPREHENSIVE METABOLIC PANEL
ALT: 30 U/L (ref 0–53)
AST: 24 U/L (ref 0–37)
CO2: 28 mEq/L (ref 19–32)
Calcium: 8.7 mg/dL (ref 8.4–10.5)
Chloride: 100 mEq/L (ref 96–112)
GFR: 78.11 mL/min (ref >60.00)
Sodium: 137 mEq/L (ref 135–145)
Total Protein: 7.1 g/dL (ref 6.0–8.3)

## 2012-03-29 NOTE — Assessment & Plan Note (Signed)
His current CVA pain complaint does NOT fit with kidney stone symptoms.  More likely musculoskeletal. Reassured pt, but went ahead and did UA w/reflex microscopy + KUB. Will obtain urology old records.

## 2012-03-29 NOTE — Assessment & Plan Note (Signed)
Lab Results  Component Value Date   CHOL 147 02/22/2012   HDL 45.60 02/22/2012   LDLCALC 70 02/22/2012   LDLDIRECT 149.7 02/07/2011   TRIG 158.0* 02/22/2012   CHOLHDL 3 02/22/2012   Much improved on crestor 10mg  qd. Continue this med.

## 2012-03-29 NOTE — Progress Notes (Signed)
OFFICE NOTE  03/29/2012  CC:  Chief Complaint  Patient presents with  . Follow-up    plantar wart     HPI: Patient is a 61 y.o. Caucasian male who is here acutely for f/u foot callus/plantar wart pain.  Hurting on right plantar surface x 2 wks.  However, he's overdue for routine DM 2 f/u. Not checking sugars.  He tends to try not to eat rather than eat sensible types of food and sensible portions.   Denies polyuria/polydipsia.  Energy level good, says he feels well.  Denies tingling, numbness, or burning in feet.  Has hx of plantar wart/callus that we have to shave and freeze occasionally. He wants that done today. He has been off of his HCTZ and phentermine since 12/2011, but restarted these yesterday b/c he went back to them for f/u.   He has been taking crestor 10mg  qd and denies side effects.  Recent lipid recheck showed great improvement on this medication. He also mentions some night time right CVA area "pulling" type pain when he lies flat on his back.  This is not associated with nausea, groin pain, or hematuria or fever.  It abates when he sits up straight. He is worried about possible kidney stone b/c he has passed several in the past (years ago) and was told that he still had one "lodged up there" in the right kidney area--he's unsure who told him that and when this was.    Pertinent PMH:  Past Medical History  Diagnosis Date  . Diabetes mellitus   . Hyperlipidemia   . History of hemorrhoids   . Diverticulosis of colon   . Diverticulitis     hospitalized  . Adenomatous colon polyp 2008    Dr. Virginia Rochester  . Shoulder pain, right      Injection on 2 consec mo at [facility] (Dr. August Saucer) 2012  . History of ETT     LOW RISK/NEG ETT 02/2011 w/Dr. Deborah Chalk  . Nephrolithiasis     Saw urologist in DIST[place] past (?[facility])  . Renal cyst, left     hemorrhagic vs proteinacious (last f/u CT 2007 showed stability)--Dr. Virginia Rochester.    MEDS:  Outpatient Prescriptions Prior to Visit  Medication  Sig Dispense Refill  . cetirizine (ZYRTEC) 10 MG tablet Take 10 mg by mouth daily.       . fluticasone-salmeterol (ADVAIR HFA) 115-21 MCG/ACT inhaler Inhale 2 puffs into the lungs 2 (two) times daily.        . hydrochlorothiazide 25 MG tablet Take 1 tablet (25 mg total) by mouth daily.  30 tablet  6  . ibuprofen (ADVIL,MOTRIN) 600 MG tablet 1 tab po bid prn pain--take with food  30 tablet  3  . metFORMIN (GLUCOPHAGE) 1000 MG tablet TAKE 1 TABLET BY MOUTH TWICE DAILY WITH A MEAL  60 tablet  0  . phentermine 37.5 MG capsule Take 37.5 mg by mouth daily.        . rosuvastatin (CRESTOR) 10 MG tablet Take 1 tablet (10 mg total) by mouth daily.  30 tablet  0    PE: Blood pressure 143/95, pulse 73, height 5\' 10"  (1.778 m), weight 211 lb (95.709 kg). Gen: Alert, well appearing.  Patient is oriented to person, place, time, and situation. Feet: right plantar surface with mild callus at distal end of 5th metatarsal, point tenderness to firm palpation over this, without erythema or fluctuance.  Sensation intact, nails normal, cap refill brisk. CVA areas: no tenderness.  IMPRESSION AND  PLAN:  Callus of foot Shaved callus with #11 scalpel, exposed 3 plantar warts.  One had a bit of bleeding in skin next to it so I did not do histo freeze on this one, but I did do 2 freeze/thaw cycles on each of the other 2 plantar warts.  Pt tolerated procedure well.  Band-aid placed, postprocedure care discussed.   DM Noncompliant with monitoring and diet but compliant with meds.   Will check HbA1c and CMET today. Continue metformin 1000mg  bid for now.  HYPERLIPIDEMIA Lab Results  Component Value Date   CHOL 147 02/22/2012   HDL 45.60 02/22/2012   LDLCALC 70 02/22/2012   LDLDIRECT 149.7 02/07/2011   TRIG 158.0* 02/22/2012   CHOLHDL 3 02/22/2012   Much improved on crestor 10mg  qd. Continue this med.  History of kidney stones His current CVA pain complaint does NOT fit with kidney stone symptoms.  More likely  musculoskeletal. Reassured pt, but went ahead and did UA w/reflex microscopy + KUB. Will obtain urology old records.    FOLLOW UP: 44mo

## 2012-03-29 NOTE — Assessment & Plan Note (Signed)
Noncompliant with monitoring and diet but compliant with meds.   Will check HbA1c and CMET today. Continue metformin 1000mg  bid for now.

## 2012-03-29 NOTE — Assessment & Plan Note (Signed)
Shaved callus with #11 scalpel, exposed 3 plantar warts.  One had a bit of bleeding in skin next to it so I did not do histo freeze on this one, but I did do 2 freeze/thaw cycles on each of the other 2 plantar warts.  Pt tolerated procedure well.  Band-aid placed, postprocedure care discussed.

## 2012-03-30 LAB — URINALYSIS, ROUTINE W REFLEX MICROSCOPIC
Bilirubin Urine: NEGATIVE
Glucose, UA: NEGATIVE mg/dL
Ketones, ur: NEGATIVE mg/dL
Protein, ur: NEGATIVE mg/dL
Specific Gravity, Urine: 1.023 (ref 1.005–1.030)
Urobilinogen, UA: 0.2 mg/dL (ref 0.0–1.0)

## 2012-04-05 ENCOUNTER — Ambulatory Visit (INDEPENDENT_AMBULATORY_CARE_PROVIDER_SITE_OTHER): Payer: BC Managed Care – PPO | Admitting: Family Medicine

## 2012-04-05 ENCOUNTER — Encounter: Payer: Self-pay | Admitting: Family Medicine

## 2012-04-05 VITALS — BP 124/85 | HR 81 | Temp 97.8°F | Ht 70.0 in | Wt 211.0 lb

## 2012-04-05 DIAGNOSIS — E119 Type 2 diabetes mellitus without complications: Secondary | ICD-10-CM

## 2012-04-05 MED ORDER — GLUCOSE BLOOD VI STRP: ORAL_STRIP | Status: DC

## 2012-04-05 NOTE — Assessment & Plan Note (Signed)
Tighter diet control + start victoza 0.6mg  sQ qAM. Gave him freestyle freedom lite glucometer today+ rx for strips. Check glucose qAM. F/u 72mo in office.

## 2012-04-05 NOTE — Progress Notes (Signed)
OFFICE NOTE  04/05/2012  CC:  Chief Complaint  Patient presents with  . Diabetes    begin victoza     HPI: Patient is a 61 y.o. Caucasian male who is here for f/u DM 2.  Recent A1c over 8%, discussed starting sulfonylurea vs victoza and he's here to talk more about this.  He admits to dietary indescretion last month or two and feels like getting back on a tighter diabetic diet would be his preferred course of action and he would also be ok with taking once daily victoza.  He says he cannot eat reliably/regularly enough to take the sulfonylurea, making him high risk for hypoglycemia on this med.  Pertinent PMH:  Past Medical History  Diagnosis Date  . Diabetes mellitus   . Hyperlipidemia   . History of hemorrhoids   . Diverticulosis of colon   . Diverticulitis     hospitalized  . Adenomatous colon polyp 2008    Dr. Virginia Rochester  . Shoulder pain, right      Injection on 2 consec mo at [facility] (Dr. August Saucer) 2012  . History of ETT     LOW RISK/NEG ETT 02/2011 w/Dr. Deborah Chalk  . Nephrolithiasis     S[facility] urolog[facility] past ([facility])  . Renal cyst, left     hemorrhagic vs proteinacious (last f/u CT 2007 showed stability)--Dr. Virginia Rochester.    MEDS:  Outpatient Prescriptions Prior to Visit  Medication Sig Dispense Refill  . cetirizine (ZYRTEC) 10 MG tablet Take 10 mg by mouth daily.       . hydrochlorothiazide 25 MG tablet Take 1 tablet (25 mg total) by mouth daily.  30 tablet  6  . ibuprofen (ADVIL,MOTRIN) 600 MG tablet 1 tab po bid prn pain--take with food  30 tablet  3  . metFORMIN (GLUCOPHAGE) 1000 MG tablet TAKE 1 TABLET BY MOUTH TWICE DAILY WITH A MEAL  60 tablet  0  . phentermine 37.5 MG capsule Take 37.5 mg by mouth daily.        . rosuvastatin (CRESTOR) 10 MG tablet Take 1 tablet (10 mg total) by mouth daily.  30 tablet  0  . fluticasone-salmeterol (ADVAIR HFA) 115-21 MCG/ACT inhaler Inhale 2 puffs into the lungs 2 (two) times daily.          PE: Blood pressure 124/85, pulse  81, temperature 97.8 F (36.6 C), temperature source Temporal, height 5\' 10"  (1.778 m), weight 211 lb (95.709 kg). Gen: Alert, well appearing.  Patient is oriented to person, place, time, and situation. No further exam today.  IMPRESSION AND PLAN:  DM Tighter diet control + start victoza 0.6mg  sQ qAM. Gave him freestyle freedom lite glucometer today+ rx for strips. Check glucose qAM. F/u 36mo in office.      FOLLOW UP: 1 mo

## 2012-04-18 ENCOUNTER — Ambulatory Visit (HOSPITAL_BASED_OUTPATIENT_CLINIC_OR_DEPARTMENT_OTHER)
Admission: RE | Admit: 2012-04-18 | Discharge: 2012-04-18 | Disposition: A | Discharge status: Home / Self Care | Payer: BC Managed Care – PPO | Source: Ambulatory Visit | Attending: Family Medicine | Admitting: Family Medicine

## 2012-04-18 DIAGNOSIS — R109 Unspecified abdominal pain: Secondary | ICD-10-CM

## 2012-04-18 DIAGNOSIS — Z87442 Personal history of urinary calculi: Secondary | ICD-10-CM | POA: Insufficient documentation

## 2012-04-19 ENCOUNTER — Other Ambulatory Visit: Payer: Self-pay | Admitting: Family Medicine

## 2012-04-19 DIAGNOSIS — T148XXA Other injury of unspecified body region, initial encounter: Secondary | ICD-10-CM

## 2012-04-19 DIAGNOSIS — M62830 Muscle spasm of back: Secondary | ICD-10-CM

## 2012-04-19 MED ORDER — CYCLOBENZAPRINE HCL 10 MG PO TABS: 10.0000 mg | ORAL_TABLET | Freq: Three times a day (TID) | ORAL | Status: AC | PRN

## 2012-04-19 NOTE — Progress Notes (Signed)
Pt notified me last night by phone that he still has ongoing nightly muscle spasm pain in mid back region on right side when he lies down supine to relax. Asymptomatic at all other times.  No malaise, no hematuria, no SOB, no skin rash. I recommended PT and prn flexeril, both rx's ordered.  Pt to f/u by phone before next office f/u if new sx's develop.--PM

## 2012-04-27 ENCOUNTER — Telehealth: Payer: Self-pay | Admitting: Family Medicine

## 2012-04-27 ENCOUNTER — Ambulatory Visit (INDEPENDENT_AMBULATORY_CARE_PROVIDER_SITE_OTHER): Payer: BC Managed Care – PPO | Admitting: Family Medicine

## 2012-04-27 ENCOUNTER — Encounter: Payer: Self-pay | Admitting: Family Medicine

## 2012-04-27 VITALS — BP 100/68 | HR 85 | Temp 97.6°F | Ht 70.0 in | Wt 207.0 lb

## 2012-04-27 DIAGNOSIS — E119 Type 2 diabetes mellitus without complications: Secondary | ICD-10-CM

## 2012-04-27 DIAGNOSIS — N529 Male erectile dysfunction, unspecified: Secondary | ICD-10-CM

## 2012-04-27 DIAGNOSIS — M549 Dorsalgia, unspecified: Secondary | ICD-10-CM

## 2012-04-27 MED ORDER — SILDENAFIL CITRATE 100 MG PO TABS: ORAL_TABLET | ORAL | Status: DC

## 2012-04-27 MED ORDER — GLUCOSE BLOOD VI STRP: ORAL_STRIP | Status: AC

## 2012-04-27 NOTE — Telephone Encounter (Signed)
Please call and tell him his last eye exam was done at [facility] in [place] on 05/24/11.  He needs to wait until after 05/23/12 to go back.  He should call their office himself and arrange the appt since he has been seen there before and doesn't need a referral from [place]. Tell him I'll put in a referral order for his wife to go there as per his request today.--thx

## 2012-04-27 NOTE — Telephone Encounter (Signed)
I have attempted to contact this patient by phone with the following results: left message to return my call on answering machine (mobile).  

## 2012-04-27 NOTE — Progress Notes (Signed)
OFFICE VISIT  05/01/2012   CC:  Chief Complaint  Patient presents with  . Follow-up    1 month     HPI:    Patient is a 61 y.o. Caucasian male who presents for f/u DM 2, recently started victoza and has remained on the 0.6mg  SQ qd dosing, also f/u for right sided back muscle strain/pain--PT referral made and flexeril prn rx'd recently. Goes next week for PT initial visit. Feels good: review of CBGs show 100-135 range, avg last 2 wks 119 but he's only checking fasting in the morning.  No side effects from meds. Says erections are a problem, can get firm enough for penetration and sexual desire is intact but can't maintain erection much of the time lately.  Asks about trial of viagra.  ROS: no hematuria, no flank pain, no cough, no fevers, no nausea, no groin pain. +chronic mild right shoulder pain with abduction and external rotation  Past Medical History  Diagnosis Date  . Diabetes mellitus   . Hyperlipidemia   . History of hemorrhoids   . Diverticulosis of colon   . Diverticulitis     hospitalized  . Adenomatous colon polyp 2008    Dr. Virginia Rochester  . Shoulder pain, right      Injection on 2 consec mo at [facility] (Dr. August Saucer) 2012  . History of ETT     LOW RISK/NEG ETT 02/2011 w/Dr. Deborah Chalk  . Nephrolithiasis     Saw urologist in [facility] past ([facility])  . Renal cyst, left     hemorrhagic vs proteinacious (last f/u CT 2007 showed stability)--Dr. Virginia Rochester.    Past Surgical History  Procedure Date  . Hemorrhoid surgery     Outpatient Prescriptions Prior to Visit  Medication Sig Dispense Refill  . cetirizine (ZYRTEC) 10 MG tablet Take 10 mg by mouth daily.       . hydrochlorothiazide 25 MG tablet Take 1 tablet (25 mg total) by mouth daily.  30 tablet  6  . ibuprofen (ADVIL,MOTRIN) 600 MG tablet 1 tab po bid prn pain--take with food  30 tablet  3  . Liraglutide (VICTOZA) 18 MG/3ML SOLN Inject 0.6 mg into the skin daily before breakfast.      . metFORMIN (GLUCOPHAGE) 1000 MG  tablet TAKE 1 TABLET BY MOUTH TWICE DAILY WITH A MEAL  60 tablet  0  . phentermine 37.5 MG capsule Take 37.5 mg by mouth daily.        . rosuvastatin (CRESTOR) 10 MG tablet Take 1 tablet (10 mg total) by mouth daily.  30 tablet  0  . glucose blood (FREESTYLE LITE) test strip Check glucose once every morning  30 each  12  . cyclobenzaprine (FLEXERIL) 10 MG tablet Take 1 tablet (10 mg total) by mouth 3 (three) times daily as needed for muscle spasms.  30 tablet  0  . fluticasone-salmeterol (ADVAIR HFA) 115-21 MCG/ACT inhaler Inhale 2 puffs into the lungs 2 (two) times daily.          No Known Allergies  ROS As per HPI  PE: Blood pressure 100/68, pulse 85, temperature 97.6 F (36.4 C), temperature source Temporal, height 5\' 10"  (1.778 m), weight 207 lb (93.895 kg), SpO2 92.00%. Gen: Alert, well appearing.  Patient is oriented to person, place, time, and situation. CV: RRR, no m/r/g.   LUNGS: CTA bilat, nonlabored resps, good aeration in all lung fields. Back: no areas of bruising, rash, or tenderness.  No muscle spasm palpated. ROM fully intact.  LABS:  None today  IMPRESSION AND PLAN:  DM Numbers improved on 0.6 of Victoza + metformin. Continue current dosing--no up-titration at this time. Start checking glucose later in the day as well as fasting (two checks per day).  Mid back pain Musculoskeletal pain. Muscle relaxant has been ordered, as has PT.  Erectile dysfunction Pt wants to try viagra so rx sent.  Therapeutic expectations and side effect profile of medication discussed today.  Patient's questions answered.      FOLLOW UP: Return in about 3 months (around 07/28/2012) for f/u DM 2 and muscle strain.

## 2012-05-01 DIAGNOSIS — M549 Dorsalgia, unspecified: Secondary | ICD-10-CM | POA: Insufficient documentation

## 2012-05-01 NOTE — Assessment & Plan Note (Signed)
Pt wants to try viagra so rx sent.  Therapeutic expectations and side effect profile of medication discussed today.  Patient's questions answered.

## 2012-05-01 NOTE — Assessment & Plan Note (Signed)
Numbers improved on 0.6 of Victoza + metformin. Continue current dosing--no up-titration at this time. Start checking glucose later in the day as well as fasting (two checks per day).

## 2012-05-01 NOTE — Telephone Encounter (Signed)
Pt texted Dr. Milinda Cave and he will come pick up card.

## 2012-05-01 NOTE — Assessment & Plan Note (Signed)
Musculoskeletal pain. Muscle relaxant has been ordered, as has PT.

## 2012-06-23 ENCOUNTER — Other Ambulatory Visit: Payer: Self-pay | Admitting: Family Medicine

## 2012-06-25 ENCOUNTER — Encounter: Payer: Self-pay | Admitting: Family Medicine

## 2012-06-25 ENCOUNTER — Ambulatory Visit (INDEPENDENT_AMBULATORY_CARE_PROVIDER_SITE_OTHER): Payer: BC Managed Care – PPO | Admitting: Family Medicine

## 2012-06-25 VITALS — BP 137/88 | HR 73 | Temp 97.2°F | Ht 70.0 in | Wt 203.0 lb

## 2012-06-25 DIAGNOSIS — K2990 Gastroduodenitis, unspecified, without bleeding: Secondary | ICD-10-CM

## 2012-06-25 DIAGNOSIS — K2970 Gastritis, unspecified, without bleeding: Secondary | ICD-10-CM

## 2012-06-25 DIAGNOSIS — R079 Chest pain, unspecified: Secondary | ICD-10-CM

## 2012-06-25 DIAGNOSIS — E119 Type 2 diabetes mellitus without complications: Secondary | ICD-10-CM

## 2012-06-25 DIAGNOSIS — K209 Esophagitis, unspecified without bleeding: Secondary | ICD-10-CM

## 2012-06-25 DIAGNOSIS — Z860101 Personal history of adenomatous and serrated colon polyps: Secondary | ICD-10-CM

## 2012-06-25 DIAGNOSIS — Z86010 Personal history of colonic polyps: Secondary | ICD-10-CM

## 2012-06-25 DIAGNOSIS — K219 Gastro-esophageal reflux disease without esophagitis: Secondary | ICD-10-CM

## 2012-06-25 LAB — CBC WITH DIFFERENTIAL/PLATELET
Basophils Absolute: 0 10*3/uL (ref 0.0–0.1)
Eosinophils Absolute: 0.3 10*3/uL (ref 0.0–0.7)
Hemoglobin: 14.9 g/dL (ref 13.0–17.0)
Lymphocytes Relative: 21.4 % (ref 12.0–46.0)
MCHC: 33.5 g/dL (ref 30.0–36.0)
Monocytes Relative: 7 % (ref 3.0–12.0)
Neutro Abs: 4.2 10*3/uL (ref 1.4–7.7)
Neutrophils Relative %: 66.8 % (ref 43.0–77.0)
RDW: 13 % (ref 11.5–14.6)

## 2012-06-25 MED ORDER — PANTOPRAZOLE SODIUM 40 MG PO TBEC: 40.0000 mg | DELAYED_RELEASE_TABLET | Freq: Every day | ORAL | Status: DC

## 2012-06-25 MED ORDER — ROSUVASTATIN CALCIUM 10 MG PO TABS: 10.0000 mg | ORAL_TABLET | Freq: Every day | ORAL | Status: DC

## 2012-06-25 NOTE — Telephone Encounter (Signed)
eScribe request for refill on METFORMIN Last seen on 04/27/12 Follow up 07/27/12 RX sent

## 2012-06-25 NOTE — Patient Instructions (Addendum)
Diet for GERD or PUD Nutrition therapy can help ease the discomfort of gastroesophageal reflux disease (GERD) and peptic ulcer disease (PUD).  HOME CARE INSTRUCTIONS   Eat your meals slowly, in a relaxed setting.   Eat 5 to 6 small meals per day.   If a food causes distress, stop eating it for a period of time.  FOODS TO AVOID  Coffee, regular or decaffeinated.   Cola beverages, regular or low calorie.   Tea, regular or decaffeinated.   Pepper.   Cocoa.   High fat foods, including meats.   Butter, margarine, hydrogenated oil (trans fats).   Peppermint or spearmint (if you have GERD).   Fruits and vegetables if not tolerated.   Alcohol.   Nicotine (smoking or chewing). This is one of the most potent stimulants to acid production in the gastrointestinal tract.   Any food that seems to aggravate your condition.  If you have questions regarding your diet, ask your caregiver or a registered dietitian. TIPS  Lying flat may make symptoms worse. Keep the head of your bed raised 6 to 9 inches (15 to 23 cm) by using a foam wedge or blocks under the legs of the bed.   Do not lay down until 3 hours after eating a meal.   Daily physical activity may help reduce symptoms.  MAKE SURE YOU:   Understand these instructions.   Will watch your condition.   Will get help right away if you are not doing well or get worse.  Document Released: 12/05/2005 Document Revised: 11/24/2011 Document Reviewed: 10/21/2011 Exit[employer] Patient Information 2012 [employer]. 

## 2012-06-25 NOTE — Assessment & Plan Note (Signed)
Intermittent monitoring at best, control seems improved on 0.6 of victoza with his metformin. Glucose in office today was 124 (non-fasting).

## 2012-06-25 NOTE — Progress Notes (Signed)
OFFICE NOTE  06/25/2012  CC: No chief complaint on file.    HPI: Patient is a 61 y.o. Caucasian male who is here for right sided burning chest pain and heart burn "very bad" lately.  GERD bad for weeks/months, but right sided chest burning started distinctly 3-4d ago. Takes one advil pm nightly.  He's pretty emotional today, tears up when talking, says "I don't know what it is but I feel bad."  Denies melena/hematochezia.  He takes nothing rx for GER but gets something at [facility]--takes 3 and is improved in 45 min, but he has not taken any for this current problem.  Says he has dealt with GER like this for years.  No dysphagia or odynophagia.  No nausea, no diaphoresis.  No cough.  No SOB, no palpitations. + Clears his throat more often lately.  Glucose 118 this AM.  Checks of glucose qod or so avg 110-120. Denies depression prior to these sx's abruptly coming on a few days ago-says he feels so bad that he gets emotional about it.  He is definitely Type A personality, does high stress work, is a self described workaholic. Pertinent PMH:  Past Medical History  Diagnosis Date  . Diabetes mellitus   . Hyperlipidemia   . History of hemorrhoids   . Diverticulosis of colon   . Diverticulitis     hospitalized  . Adenomatous colon polyp 2008    Dr. Virginia Rochester  . Shoulder pain, right      Injection on 2 consec mo at [facility] (Dr. August Saucer) 2012  . History of ETT     LOW RISK/NEG ETT 02/2011 w/Dr. Deborah Chalk  . Nephrolithiasis     Saw urologist in [facility] past ([facility])  . Renal cyst, left     hemorrhagic vs proteinacious (last f/u CT 2007 showed stability)--Dr. Virginia Rochester.    MEDS:  Outpatient Prescriptions Prior to Visit  Medication Sig Dispense Refill  . cetirizine (ZYRTEC) 10 MG tablet Take 10 mg by mouth daily.       Marland Kitchen glucose blood (FREESTYLE LITE) test strip Check glucose twice per day  60 each  12  . hydrochlorothiazide 25 MG tablet Take 1 tablet (25 mg total) by mouth daily.  30 tablet  6  .  Liraglutide (VICTOZA) 18 MG/3ML SOLN Inject 0.6 mg into the skin daily before breakfast.      . metFORMIN (GLUCOPHAGE) 1000 MG tablet TAKE 1 TABLET BY MOUTH TWICE DAILY WITH A MEAL  60 tablet  0  . phentermine 37.5 MG capsule Take 37.5 mg by mouth daily.        . sildenafil (VIAGRA) 100 MG tablet 1/2-1 tab po 30 min prior to intercourse; use once daily maximum  5 tablet  5  . rosuvastatin (CRESTOR) 10 MG tablet Take 1 tablet (10 mg total) by mouth daily.  30 tablet  0  . ibuprofen (ADVIL,MOTRIN) 600 MG tablet 1 tab po bid prn pain--take with food  30 tablet  3    PE: Blood pressure 137/88, pulse 73, temperature 97.2 F (36.2 C), temperature source Temporal, height 5\' 10"  (1.778 m), weight 203 lb (92.08 kg). Gen: Alert, well appearing.  Patient is oriented to person, place, time, and situation. ENT:  Eyes: no injection, icteris, swelling, or exudate.  EOMI, PERRLA. Nose: no drainage or turbinate edema/swelling.  No injection or focal lesion.  Mouth: lips without lesion/swelling.  Oral mucosa pink and moist.  Dentition intact and without obvious caries or gingival swelling.  Oropharynx  without erythema, exudate, or swelling.  Neck - No masses or thyromegaly or limitation in range of motion CV: RRR, no m/r/g.   LUNGS: CTA bilat, nonlabored resps, good aeration in all lung fields. Chest wall: ABD:  EXT: no clubbing, cyanosis, or edema.   LABS:  12 lead EKG today:  NSR, no ischemic changes or LVH.  IMPRESSION AND PLAN:  GERD (gastroesophageal reflux disease) Poorly controlled for years, worse lately and now has symptoms c/w esophagitis. Start pantoprazole 40mg  qd.  Check H. Pylori ab --some of his sx's may be a result of gastritis. Gave pt GERD diet handout.  Chest pain Noncardiac in nature. I really think this is consistent with esophagitis from uncontrolled GERD. EKG today reassuring.  DM Intermittent monitoring at best, control seems improved on 0.6 of victoza with his  metformin. Glucose in office today was 124 (non-fasting).     FOLLOW UP: 1 mo

## 2012-06-25 NOTE — Assessment & Plan Note (Addendum)
Poorly controlled for years, worse lately and now has symptoms c/w esophagitis. Start pantoprazole 40mg  qd.  Check H. Pylori ab --some of his sx's may be a result of gastritis. Gave pt GERD diet handout. I will refer him to GI for consideration of upper endoscopy.  His former GI MD, Dr. Virginia Rochester, is not practicing here anymore so will make GI referral to [facility] for his current problem of long term uncontrolled GERD + f/u colonoscopy for history of adenomatous colon polyps (2008).

## 2012-06-25 NOTE — Assessment & Plan Note (Signed)
Noncardiac in nature. I really think this is consistent with esophagitis from uncontrolled GERD. EKG today reassuring.

## 2012-06-26 LAB — H. PYLORI ANTIBODY, IGG: H Pylori IgG: NEGATIVE

## 2012-07-05 ENCOUNTER — Encounter: Payer: Self-pay | Admitting: Gastroenterology

## 2012-07-27 ENCOUNTER — Encounter: Payer: Self-pay | Admitting: Family Medicine

## 2012-07-27 ENCOUNTER — Ambulatory Visit (INDEPENDENT_AMBULATORY_CARE_PROVIDER_SITE_OTHER): Payer: BC Managed Care – PPO | Admitting: Family Medicine

## 2012-07-27 VITALS — BP 116/79 | HR 73 | Ht 70.0 in | Wt 209.0 lb

## 2012-07-27 DIAGNOSIS — E119 Type 2 diabetes mellitus without complications: Secondary | ICD-10-CM

## 2012-07-27 DIAGNOSIS — Z23 Encounter for immunization: Secondary | ICD-10-CM

## 2012-07-27 DIAGNOSIS — L538 Other specified erythematous conditions: Secondary | ICD-10-CM

## 2012-07-27 DIAGNOSIS — D126 Benign neoplasm of colon, unspecified: Secondary | ICD-10-CM

## 2012-07-27 DIAGNOSIS — L304 Erythema intertrigo: Secondary | ICD-10-CM | POA: Insufficient documentation

## 2012-07-27 DIAGNOSIS — IMO0001 Reserved for inherently not codable concepts without codable children: Secondary | ICD-10-CM

## 2012-07-27 DIAGNOSIS — K219 Gastro-esophageal reflux disease without esophagitis: Secondary | ICD-10-CM

## 2012-07-27 LAB — HEMOGLOBIN A1C: Hgb A1c MFr Bld: 7.4 % — ABNORMAL HIGH (ref 4.6–6.5)

## 2012-07-27 MED ORDER — CLOTRIMAZOLE-BETAMETHASONE 1-0.05 % EX CREA: TOPICAL_CREAM | Freq: Two times a day (BID) | CUTANEOUS | Status: DC

## 2012-07-27 NOTE — Assessment & Plan Note (Signed)
Noncompliant with monitoring, questionable compliance with diet. He is compliant with metformin and victoza. Due for HbA1c today.  Otherwise UTD on monitoring. Gave pneumovax and Tdap today.

## 2012-07-27 NOTE — Assessment & Plan Note (Signed)
Was seeing Dr. Virginia Rochester, who no longer practices in this region. Last colonoscopy was 5 yrs ago, so he is due.  We have set him up with [facility] for initial consult appt on 08/01/12.

## 2012-07-27 NOTE — Patient Instructions (Signed)
Keep appt with [facility] on 08/01/12 at 2:30pm and bring your wife with you.  Then you can talk with them about arranging your procedures on the same day.

## 2012-07-27 NOTE — Assessment & Plan Note (Signed)
Apply lotrisone bid prn. Keep area as dry as possible and keep opposable skin surfaces separated as much as possible.

## 2012-07-27 NOTE — Assessment & Plan Note (Signed)
Quiescent as long as he takes his PPI daily.

## 2012-07-27 NOTE — Progress Notes (Signed)
OFFICE VISIT  07/27/2012   CC:  Chief Complaint  Patient presents with  . Follow-up    DM, HTN     HPI:    Patient is a 61 y.o. Caucasian male who presents for 4 mo f/u DM 2.  Started victoza 0.6 qd 4 mo ago. Has not checked his glucose in the last 97mo.  Due for HbA1c today. Has had itchy underarm rash for the last 1 wk, no new contact allergens or irritants.  He thought it may be from Crestor so he stopped it. He says he remains to be GERD/Chest pain-free since getting on pantoprazole daily. Furthermore, he has noticed less throat clearing, less swallowing difficulty and less sore throat.  He is very pleased with these results. He is to have GI appt with [facility] to get established with them for f/u hx of adenomatous colon polyps.   Past Medical History  Diagnosis Date  . Diabetes mellitus   . Hyperlipidemia   . History of hemorrhoids   . Diverticulosis of colon   . Diverticulitis     hospitalized  . Adenomatous colon polyp 2008    Dr. Virginia Rochester  . Shoulder pain, right      Injection on 2 consec mo at [facility] (Dr. August Saucer) 2012  . History of ETT     LOW RISK/NEG ETT 02/2011 w/Dr. Deborah Chalk  . Nephrolithiasis     Saw urologist [facility] past ([facility])  . Renal cyst, left     hemorrhagic vs proteinacious (last f/u CT 2007 showed stability)--Dr. Virginia Rochester.    Past Surgical History  Procedure Date  . Hemorrhoid surgery     Outpatient Prescriptions Prior to Visit  Medication Sig Dispense Refill  . cetirizine (ZYRTEC) 10 MG tablet Take 10 mg by mouth daily.       Marland Kitchen glucose blood (FREESTYLE LITE) test strip Check glucose twice per day  60 each  12  . hydrochlorothiazide 25 MG tablet Take 1 tablet (25 mg total) by mouth daily.  30 tablet  6  . Liraglutide (VICTOZA) 18 MG/3ML SOLN Inject 0.6 mg into the skin daily before breakfast.      . metFORMIN (GLUCOPHAGE) 1000 MG tablet TAKE 1 TABLET BY MOUTH TWICE DAILY WITH A MEAL  60 tablet  0  . pantoprazole (PROTONIX) 40 MG tablet Take  1 tablet (40 mg total) by mouth daily.  30 tablet  10  . phentermine 37.5 MG capsule Take 37.5 mg by mouth daily.        . sildenafil (VIAGRA) 100 MG tablet 1/2-1 tab po 30 min prior to intercourse; use once daily maximum  5 tablet  5  . ibuprofen (ADVIL,MOTRIN) 600 MG tablet 1 tab po bid prn pain--take with food  30 tablet  3  . rosuvastatin (CRESTOR) 10 MG tablet Take 1 tablet (10 mg total) by mouth daily.  30 tablet  6    No Known Allergies  ROS As per HPI  PE: Blood pressure 116/79, pulse 73, height 5\' 10"  (1.778 m), weight 209 lb (94.802 kg). Gen: Alert, well appearing.  Patient is oriented to person, place, time, and situation. ENT: Eyes: no injection, icteris, swelling, or exudate.  EOMI, PERRLA. Nose: no drainage or turbinate edema/swelling.  No injection or focal lesion.  Mouth: lips without lesion/swelling.  Oral mucosa pink and moist.  Dentition intact and without obvious caries or gingival swelling.  Oropharynx without erythema, exudate, or swelling.  Neck: supple/nontender.  No LAD, mass, or TM.  Carotid  pulses 2+ bilaterally, without bruits. CV: RRR, no m/r/g.   LUNGS: CTA bilat, nonlabored resps, good aeration in all lung fields. EXT: no clubbing, cyanosis, or edema.  SKIN: both axillary areas with mild pinkish macular rash-barely palbable, at sites where opposable skin surfaces are in contact/rubbing.  No vesicles, pustules, or hives.  No maceration.  LABS:  None today  IMPRESSION AND PLAN:  Type II or unspecified type diabetes mellitus without mention of complication, uncontrolled Noncompliant with monitoring, questionable compliance with diet. He is compliant with metformin and victoza. Due for HbA1c today.  Otherwise UTD on monitoring. Gave pneumovax and Tdap today.  Intertrigo Apply lotrisone bid prn. Keep area as dry as possible and keep opposable skin surfaces separated as much as possible.  GERD (gastroesophageal reflux disease) Quiescent as long as he  takes his PPI daily.  Adenomatous colon polyp Was seeing Dr. Virginia Rochester, who no longer practices in this region. Last colonoscopy was 5 yrs ago, so he is due.  We have set him up with [facility] for initial consult appt on 08/01/12.    FOLLOW UP: Return in about 4 months (around 11/26/2012) for f/u DM 2.

## 2012-08-01 ENCOUNTER — Ambulatory Visit: Payer: BC Managed Care – PPO | Admitting: Gastroenterology

## 2012-08-11 ENCOUNTER — Other Ambulatory Visit: Payer: Self-pay | Admitting: Family Medicine

## 2012-08-13 NOTE — Telephone Encounter (Signed)
eScribe request for refill on HCTZ Last filled - 03/16/12, #30 X 6 Last seen on - 07/27/12 Follow up - 11/26/12 RX sent

## 2012-08-22 ENCOUNTER — Telehealth: Payer: Self-pay | Admitting: Gastroenterology

## 2012-08-22 NOTE — Telephone Encounter (Signed)
Message copied by Arna Snipe on Wed Aug 22, 2012 10:31 AM ------      Message from: Donata Duff      Created: Wed Aug 01, 2012  3:26 PM       Do not bill

## 2012-09-07 ENCOUNTER — Other Ambulatory Visit: Payer: Self-pay | Admitting: *Deleted

## 2012-09-07 MED ORDER — ROSUVASTATIN CALCIUM 10 MG PO TABS: 10.0000 mg | ORAL_TABLET | Freq: Every day | ORAL | Status: DC

## 2012-09-07 MED ORDER — PANTOPRAZOLE SODIUM 40 MG PO TBEC: 40.0000 mg | DELAYED_RELEASE_TABLET | Freq: Every day | ORAL | Status: DC

## 2012-09-07 MED ORDER — HYDROCHLOROTHIAZIDE 25 MG PO TABS: 25.0000 mg | ORAL_TABLET | Freq: Every day | ORAL | Status: DC

## 2012-09-07 NOTE — Telephone Encounter (Signed)
PC from pt requesting 60 day supply of HCTZ, crestor and Protonix.  This is done.  Warned pt that insurance may only pay for 30 day, but we will send in for 60.  He is agreeable.

## 2012-09-14 ENCOUNTER — Telehealth: Payer: Self-pay | Admitting: Family Medicine

## 2012-09-14 ENCOUNTER — Ambulatory Visit (INDEPENDENT_AMBULATORY_CARE_PROVIDER_SITE_OTHER): Payer: BC Managed Care – PPO | Admitting: Family Medicine

## 2012-09-14 ENCOUNTER — Encounter: Payer: Self-pay | Admitting: Family Medicine

## 2012-09-14 VITALS — BP 155/87 | HR 77 | Temp 97.5°F | Ht 70.0 in | Wt 209.4 lb

## 2012-09-14 DIAGNOSIS — M25519 Pain in unspecified shoulder: Secondary | ICD-10-CM

## 2012-09-14 DIAGNOSIS — B070 Plantar wart: Secondary | ICD-10-CM

## 2012-09-14 DIAGNOSIS — M25511 Pain in right shoulder: Secondary | ICD-10-CM

## 2012-09-14 NOTE — Telephone Encounter (Signed)
FYI

## 2012-09-14 NOTE — Telephone Encounter (Signed)
Caller: Wendelin/Patient; Patient Name: Shaun Knight; PCP: Earley Favor [facility] Best Callback Phone Number: [phone] Patient states his appointment was 15:45. He is almost there . No Triage/ no informaiton. Caller hung up.  Arriving at office

## 2012-09-14 NOTE — Telephone Encounter (Signed)
Caller: [name]/Patient; Patient Name: Shaun Knight; PCP: Earley Favor [facility]); Best Callback Phone Number: [phone].  Patient calling about plantar wart.  States "it needs to be shaved down this afternoon." Refuses triage; wants appointment.  Offered appointment 1500 09/14/12; states he is in [place] and cannot make it before 1600.  Offered 1545 appointment with Dr. Abner Greenspan; refused.  Patient would like Dr. Milinda Cave to call him on his cell phone [phone].  Info to office for staff/provider review/callback.

## 2012-09-14 NOTE — Telephone Encounter (Signed)
Noted.  Pt here to be seen by me now.--PM

## 2012-09-15 MED ORDER — METHYLPREDNISOLONE ACETATE 40 MG/ML IJ SUSP: 40.0000 mg | Freq: Once | INTRAMUSCULAR | Status: DC

## 2012-09-15 NOTE — Assessment & Plan Note (Signed)
Right foot 5th metatarsal head region. Shaved callus from around/on top of this, then used verruca-freeze to do 3 separate 45 second freeze/thaw cycles to two separate plantar warts. Return prn.

## 2012-09-15 NOTE — Assessment & Plan Note (Signed)
Right shoulder rotator cuff tendonopathy/impingement.  Procedure: Therapeutic shoulder injection.  The patient's clinical condition is marked by substantial pain and/or significant functional disability.  Other conservative therapy has not provided relief, is contraindicated, or not appropriate.  There is a reasonable likelihood that injection will significantly improve the patient's pain and/or functional disability. Cleaned skin with alcohol swab, used posterolateral approach, Injected 1 ml of 40mg /ml depo medrol + 2 cc of 2% lidocaine into subacromial space without resistance.  Half of this was directed towards the coracoid process and 1/2 was directed towards the [facility] joint.  No immediate complications.  Patient tolerated procedure well.  Post-injection care discussed, including 20 min of icing 1-2 times in the next 4-8 hours.

## 2012-09-15 NOTE — Progress Notes (Signed)
OFFICE NOTE  09/15/2012  CC:  Chief Complaint  Patient presents with  . Shoulder Pain    right shoulder pain- feels like a toothache  . Callouses    right foot  . Injections    flu     HPI: Patient is a 61 y.o. Caucasian male who is here for right foot plantar wart pain and right shoulder pain. Wart + callus at head of 5th metatarsal on right foot hurting for at least 2 wks, worse the last 2 wks.  Worse with wt bearing.  No redness or opening in the skin.  No injury. Also, right shoulder hurting with abduction and ER/IR, at least several weeks, getting worse.  He sleeps on his stomach with arm raised in abducted and ER position.  Has tried some heat occasionally, no meds.  No hx of injury to shoulder but he has had this tendonopathy several times in the past and it has responded to steroid injection in joint.  Pertinent PMH:  Past Medical History  Diagnosis Date  . Diabetes mellitus   . Hyperlipidemia   . History of hemorrhoids   . Diverticulosis of colon   . Diverticulitis     hospitalized  . Adenomatous colon polyp 2008    Dr. Virginia Rochester  . Shoulder pain, right      Injection on 2 consec mo at [facility] (Dr. August Saucer) 2012  . History of ETT     LOW RISK/NEG ETT 02/2011 w/Dr. Deborah Chalk  . Nephrolithiasis     Saw urologist in [facility] past ([facility])  . Renal cyst, left     hemorrhagic vs proteinacious (last f/u CT 2007 showed stability)--Dr. Virginia Rochester.    MEDS:  Outpatient Prescriptions Prior to Visit  Medication Sig Dispense Refill  . cetirizine (ZYRTEC) 10 MG tablet Take 10 mg by mouth daily.       . clotrimazole-betamethasone (LOTRISONE) cream Apply topically 2 (two) times daily. Apply to affected areas bid prn  45 g  1  . Glucose Blood (FREESTYLE LITE TEST VI)       . glucose blood (FREESTYLE LITE) test strip Check glucose twice per day  60 each  12  . hydrochlorothiazide (HYDRODIURIL) 25 MG tablet Take 1 tablet (25 mg total) by mouth daily.  60 tablet  3  . ibuprofen  (ADVIL,MOTRIN) 600 MG tablet 1 tab po bid prn pain--take with food  30 tablet  3  . Liraglutide (VICTOZA) 18 MG/3ML SOLN Inject 0.6 mg into the skin daily before breakfast.      . metFORMIN (GLUCOPHAGE) 1000 MG tablet TAKE 1 TABLET BY MOUTH TWICE DAILY WITH A MEAL  60 tablet  0  . pantoprazole (PROTONIX) 40 MG tablet Take 1 tablet (40 mg total) by mouth daily.  60 tablet  5  . phentermine 37.5 MG capsule Take 37.5 mg by mouth daily.        . rosuvastatin (CRESTOR) 10 MG tablet Take 1 tablet (10 mg total) by mouth daily.  60 tablet  3  . sildenafil (VIAGRA) 100 MG tablet 1/2-1 tab po 30 min prior to intercourse; use once daily maximum  5 tablet  5   No facility-administered medications prior to visit.    PE: Blood pressure 155/87, pulse 77, temperature 97.5 F (36.4 C), temperature source Temporal, height 5\' 10"  (1.778 m), weight 209 lb 6.4 oz (94.983 kg), SpO2 93.00%. Gen: Alert, well appearing.  Patient is oriented to person, place, time, and situation. Right shoulder: minimal TTP around coracoid process  region.  AC joint and subacromial region nontender.  ROM in ER and ABD is painful, and abduction is limited to 110 degrees due to pain.  Negative drop sign.  Right arm flexion also brings pain, but extension does not.  UE strength 5/5 bilat.  DTRs in UEs symmetric.  No neck tenderness.  Neck ROM intact.  IMPRESSION AND PLAN:  PLANTAR WART Right foot 5th metatarsal head region. Shaved callus from around/on top of this, then used verruca-freeze to do 3 separate 45 second freeze/thaw cycles to two separate plantar warts. Return prn.  Shoulder pain, right Right shoulder rotator cuff tendonopathy/impingement.  Procedure: Therapeutic shoulder injection.  The patient's clinical condition is marked by substantial pain and/or significant functional disability.  Other conservative therapy has not provided relief, is contraindicated, or not appropriate.  There is a reasonable likelihood that  injection will significantly improve the patient's pain and/or functional disability. Cleaned skin with alcohol swab, used posterolateral approach, Injected 1 ml of 40mg /ml depo medrol + 2 cc of 2% lidocaine into subacromial space without resistance.  Half of this was directed towards the coracoid process and 1/2 was directed towards the [facility] joint.  No immediate complications.  Patient tolerated procedure well.  Post-injection care discussed, including 20 min of icing 1-2 times in the next 4-8 hours.     FOLLOW UP: prn

## 2012-09-21 ENCOUNTER — Other Ambulatory Visit: Payer: Self-pay | Admitting: Family Medicine

## 2012-09-24 NOTE — Telephone Encounter (Signed)
eScribe request for refill on Metformin Last filled -  Last seen on - 09/14/12 Follow up - 11/26/12 RX sent

## 2012-10-04 ENCOUNTER — Encounter: Payer: Self-pay | Admitting: Family Medicine

## 2012-10-04 ENCOUNTER — Ambulatory Visit (HOSPITAL_BASED_OUTPATIENT_CLINIC_OR_DEPARTMENT_OTHER)
Admission: RE | Admit: 2012-10-04 | Discharge: 2012-10-04 | Disposition: A | Discharge status: Home / Self Care | Payer: BC Managed Care – PPO | Source: Ambulatory Visit | Attending: Family Medicine | Admitting: Family Medicine

## 2012-10-04 ENCOUNTER — Ambulatory Visit (INDEPENDENT_AMBULATORY_CARE_PROVIDER_SITE_OTHER): Payer: BC Managed Care – PPO | Admitting: Family Medicine

## 2012-10-04 ENCOUNTER — Other Ambulatory Visit: Payer: Self-pay | Admitting: Family Medicine

## 2012-10-04 VITALS — BP 130/84 | HR 76 | Ht 70.0 in | Wt 205.0 lb

## 2012-10-04 DIAGNOSIS — M7730 Calcaneal spur, unspecified foot: Secondary | ICD-10-CM | POA: Insufficient documentation

## 2012-10-04 DIAGNOSIS — B070 Plantar wart: Secondary | ICD-10-CM

## 2012-10-04 DIAGNOSIS — M79671 Pain in right foot: Secondary | ICD-10-CM

## 2012-10-04 DIAGNOSIS — M79609 Pain in unspecified limb: Secondary | ICD-10-CM

## 2012-10-04 NOTE — Assessment & Plan Note (Signed)
Question of heel spur vs bone bruise. Check x-ray.

## 2012-10-04 NOTE — Assessment & Plan Note (Signed)
Shaved callus that is on top of this, then used histo-freeze to do 3 separate freeze-thaw cycles that were 45 sec long. Pt tolerated procedure well.  No immediate complications.

## 2012-10-04 NOTE — Progress Notes (Signed)
OFFICE NOTE  10/04/2012  CC:  Chief Complaint  Patient presents with  . Foot Pain    right foot     HPI: Patient is a 61 y.o. Caucasian male who is here for right foot pain. Has had ongoing problem with callus and plantar wart that we have shaved and frozen with histo-freeze on multiple occasions.  Also, lately has had right heel pain with wt bearing--bothersome for 2 wks at least. Has tried shoes with more cushion-no help.  Recalls no trauma.    Pertinent PMH:  Past Medical History  Diagnosis Date  . Diabetes mellitus   . Hyperlipidemia   . History of hemorrhoids   . Diverticulosis of colon   . Diverticulitis     hospitalized  . Adenomatous colon polyp 2008    Dr. Virginia Rochester  . Shoulder pain, right      Injection on 2 consec mo at [facility] (Dr. August Saucer) 2012  . History of ETT     LOW RISK/NEG ETT 02/2011 w/Dr. Deborah Chalk  . Nephrolithiasis     Saw urologist in DISTANT past ([facility])  . Renal cyst, left     hemorrhagic vs proteinacious (last f/u CT 2007 showed stability)--Dr. Virginia Rochester.    MEDS:  Outpatient Prescriptions Prior to Visit  Medication Sig Dispense Refill  . cetirizine (ZYRTEC) 10 MG tablet Take 10 mg by mouth daily.       . clotrimazole-betamethasone (LOTRISONE) cream Apply topically 2 (two) times daily. Apply to affected areas bid prn  45 g  1  . Glucose Blood (FREESTYLE LITE TEST VI)       . glucose blood (FREESTYLE LITE) test strip Check glucose twice per day  60 each  12  . hydrochlorothiazide (HYDRODIURIL) 25 MG tablet Take 1 tablet (25 mg total) by mouth daily.  60 tablet  3  . ibuprofen (ADVIL,MOTRIN) 600 MG tablet 1 tab po bid prn pain--take with food  30 tablet  3  . Liraglutide (VICTOZA) 18 MG/3ML SOLN Inject 0.6 mg into the skin daily before breakfast.      . metFORMIN (GLUCOPHAGE) 1000 MG tablet TAKE 1 TABLET BY MOUTH TWICE DAILY WITH A MEAL  60 tablet  3  . pantoprazole (PROTONIX) 40 MG tablet Take 1 tablet (40 mg total) by mouth daily.  60 tablet  5  .  phentermine 37.5 MG capsule Take 37.5 mg by mouth daily.        . rosuvastatin (CRESTOR) 10 MG tablet Take 1 tablet (10 mg total) by mouth daily.  60 tablet  3  . sildenafil (VIAGRA) 100 MG tablet 1/2-1 tab po 30 min prior to intercourse; use once daily maximum  5 tablet  5    PE: Blood pressure 130/84, pulse 76, height 5\' 10"  (1.778 m), weight 205 lb (92.987 kg). Gen: Alert, well appearing.  Patient is oriented to person, place, time, and situation. Right foot:  Tenderness of heel on plantar surface exclusively.  No posterior heel tenderness, no tenderness of plantar fascia, no tenderness of achilles tendon. Base of 5th metatarsal region has a callus with a plantar wart visible in the center.  TTP directly over the wart.  IMPRESSION AND PLAN:  PLANTAR WART Shaved callus that is on top of this, then used histo-freeze to do 3 separate freeze-thaw cycles that were 45 sec long. Pt tolerated procedure well.  No immediate complications.    Pain of right heel Question of heel spur vs bone bruise. Check x-ray.   An After Visit  Summary was printed and given to the patient.  FOLLOW UP: prn

## 2012-10-12 IMAGING — CR DG ABDOMEN 1V
1 series · 1 of 1 positions shown · non-contrast
Comparison: CT 06/29/2009.

CLINICAL DATA: History of intermittent right flank pain.  History
of urolithiasis.

ABDOMEN - 1 VIEW

[t abdomen supine]
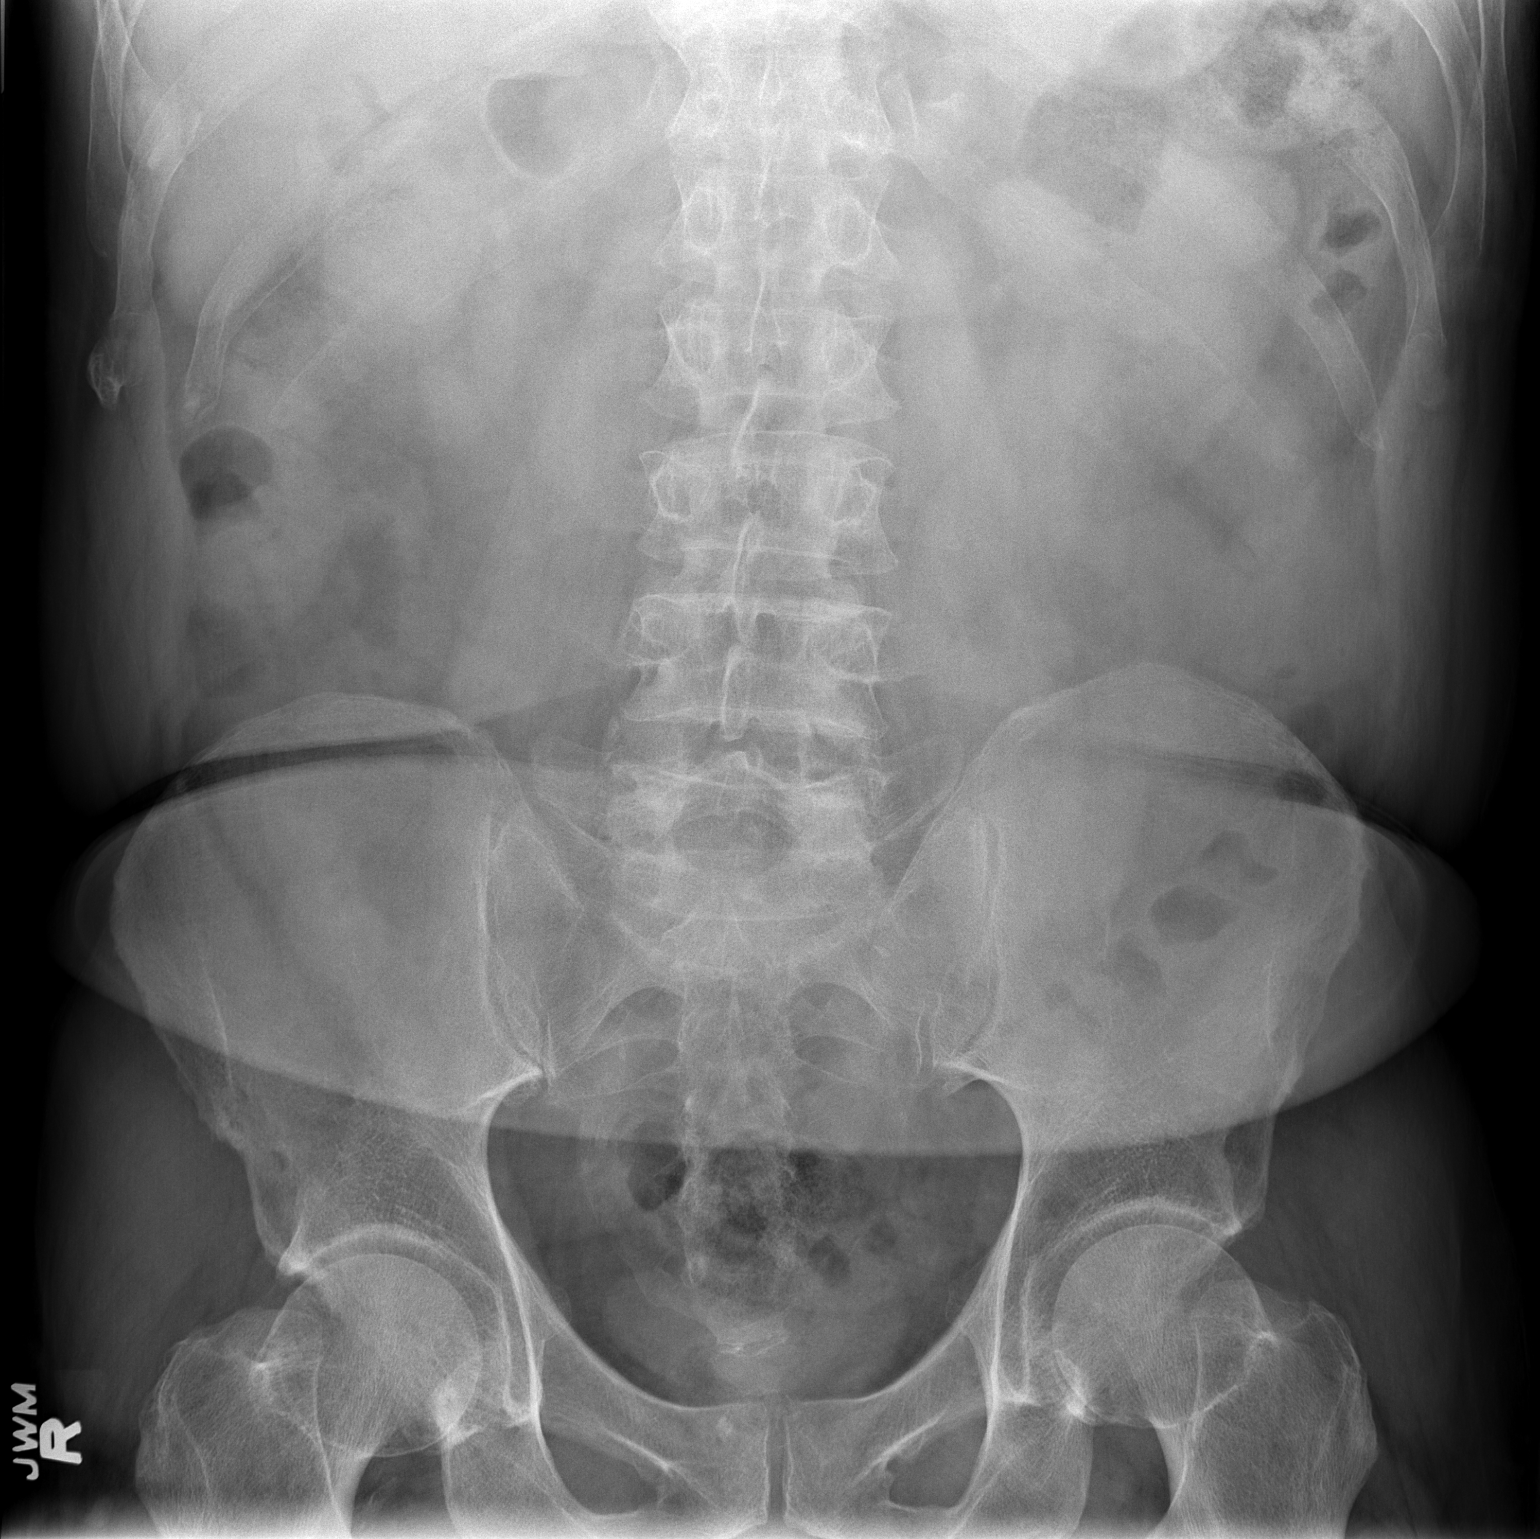

[1 of 1 positions shown; findings below may reference images not displayed]

FINDINGS: No definite opaque urinary calculus is evident.
Prostatic calcification is present.  Bowel gas pattern is within
limits of normal.  There is subtle slight scoliosis.  There is
minimal degenerative spondylosis.
IMPRESSION: No definite opaque urinary calculus evident.  Normal bowel gas
pattern.

## 2012-11-26 ENCOUNTER — Ambulatory Visit (INDEPENDENT_AMBULATORY_CARE_PROVIDER_SITE_OTHER): Payer: BC Managed Care – PPO | Admitting: Family Medicine

## 2012-11-26 ENCOUNTER — Encounter: Payer: Self-pay | Admitting: Family Medicine

## 2012-11-26 VITALS — BP 115/77 | HR 73 | Temp 97.0°F | Ht 70.0 in | Wt 210.0 lb

## 2012-11-26 DIAGNOSIS — E785 Hyperlipidemia, unspecified: Secondary | ICD-10-CM

## 2012-11-26 DIAGNOSIS — D126 Benign neoplasm of colon, unspecified: Secondary | ICD-10-CM

## 2012-11-26 DIAGNOSIS — IMO0001 Reserved for inherently not codable concepts without codable children: Secondary | ICD-10-CM

## 2012-11-26 LAB — MICROALBUMIN / CREATININE URINE RATIO: Microalb, Ur: 2.1 mg/dL — ABNORMAL HIGH (ref 0.0–1.9)

## 2012-11-26 LAB — LIPID PANEL
Cholesterol: 178 mg/dL (ref 0–200)
LDL Cholesterol: 99 mg/dL (ref 0–99)
Total CHOL/HDL Ratio: 4
VLDL: 32.4 mg/dL (ref 0.0–40.0)

## 2012-11-26 MED ORDER — METFORMIN HCL 1000 MG PO TABS: ORAL_TABLET | ORAL | Status: DC

## 2012-11-26 NOTE — Assessment & Plan Note (Signed)
HbA1c today, foot exam normal today, urine microalb/cr today. Continue current meds at current doses for now.  Encouraged more compliance with diabetic diet and increased exercise.

## 2012-11-26 NOTE — Assessment & Plan Note (Signed)
Check FLP today. Cont crestor 10mg  qd for now.

## 2012-11-26 NOTE — Progress Notes (Signed)
OFFICE NOTE  11/26/2012  CC:  Chief Complaint  Patient presents with  . Follow-up    DM, not checking sugars at home     HPI: Patient is a 61 y.o. Caucasian male who is here for 4 mo f/u DM 2. Feels well.  Not exercising.  Not monitoring glucoses any.  Compliant with metformin 1000mg  bid and victoza 0.6mg  SQ qd. DEnies any burning, tingling, or numbness in feet.  No problems with plantar wart lately.   Says cholest "a little high" at recent health fair.  Last check of this here in 02/2012 was good.  He is compliant with crestor w/out signif side effect. He did not go to [facility] after last visit-we referred him for surveillance for hx of adenomatous colon polyps due to his original GI MD not being in the area anymore.     Pertinent PMH:  Past Medical History  Diagnosis Date  . Diabetes mellitus   . Hyperlipidemia   . History of hemorrhoids   . Diverticulosis of colon   . Diverticulitis     hospitalized  . Adenomatous colon polyp 2008    Dr. Virginia Rochester  . Shoulder pain, right      Injection on 2 consec mo at [facility] (Dr. August Saucer) 2012  . History of ETT     LOW RISK/NEG ETT 02/2011 w/Dr. Deborah Chalk  . Nephrolithiasis     Saw urologist in DISTANT past ([facility])  . Renal cyst, left     hemorrhagic vs proteinacious (last f/u CT 2007 showed stability)--Dr. Virginia Rochester.    MEDS:  Outpatient Prescriptions Prior to Visit  Medication Sig Dispense Refill  . cetirizine (ZYRTEC) 10 MG tablet Take 10 mg by mouth daily.       . Glucose Blood (FREESTYLE LITE TEST VI)       . glucose blood (FREESTYLE LITE) test strip Check glucose twice per day  60 each  12  . hydrochlorothiazide (HYDRODIURIL) 25 MG tablet Take 1 tablet (25 mg total) by mouth daily.  60 tablet  3  . ibuprofen (ADVIL,MOTRIN) 600 MG tablet 1 tab po bid prn pain--take with food  30 tablet  3  . Liraglutide (VICTOZA) 18 MG/3ML SOLN Inject 0.6 mg into the skin daily before breakfast.      . pantoprazole (PROTONIX) 40 MG tablet Take 1  tablet (40 mg total) by mouth daily.  60 tablet  5  . rosuvastatin (CRESTOR) 10 MG tablet Take 1 tablet (10 mg total) by mouth daily.  60 tablet  3  . sildenafil (VIAGRA) 100 MG tablet 1/2-1 tab po 30 min prior to intercourse; use once daily maximum  5 tablet  5  . [DISCONTINUED] metFORMIN (GLUCOPHAGE) 1000 MG tablet TAKE 1 TABLET BY MOUTH TWICE DAILY WITH A MEAL  60 tablet  3  . phentermine 37.5 MG capsule Take 37.5 mg by mouth daily.        . [DISCONTINUED] clotrimazole-betamethasone (LOTRISONE) cream Apply topically 2 (two) times daily. Apply to affected areas bid prn  45 g  1  Last reviewed on 11/26/2012  9:25 AM by Luisa Dago, CMA  PE: Blood pressure 115/77, pulse 73, temperature 97 F (36.1 C), height 5\' 10"  (1.778 m), weight 210 lb (95.255 kg). Gen: Alert, well appearing.  Patient is oriented to person, place, time, and situation. FEET: no deformity.  Warm to touch.  No erythema or skin breakdown.  Small callus over right foot plantar wart nontender to compression.  DP and PT pulses  2+ bilat.  No sensory deficit on monofilament testing bilat.  IMPRESSION AND PLAN:  Type II or unspecified type diabetes mellitus without mention of complication, uncontrolled HbA1c today, foot exam normal today, urine microalb/cr today. Continue current meds at current doses for now.  Encouraged more compliance with diabetic diet and increased exercise.  HYPERLIPIDEMIA Check FLP today. Cont crestor 10mg  qd for now.  Adenomatous colon polyp Referred for surveillance last visit to [facility] but pt had to cancel due to scheduling probs and he never recontacted them. I gave him [facility]'s phone number today and he is to call them and reschedule this visit.   An After Visit Summary was printed and given to the patient.  FOLLOW UP: 60mo

## 2012-11-26 NOTE — Assessment & Plan Note (Signed)
Referred for surveillance last visit to [facility] but pt had to cancel due to scheduling probs and he never recontacted them. I gave him [facility]'s phone number today and he is to call them and reschedule this visit.

## 2012-12-04 ENCOUNTER — Telehealth: Payer: Self-pay | Admitting: Family Medicine

## 2012-12-04 NOTE — Telephone Encounter (Signed)
Caller: Bora/Patient; Phone: [phone]; Reason for Call: Is returning call to office that received 12-16.

## 2012-12-05 NOTE — Telephone Encounter (Signed)
Return call in result note.

## 2013-01-07 ENCOUNTER — Encounter: Payer: BC Managed Care – PPO | Admitting: Family Medicine

## 2013-01-07 DIAGNOSIS — Z0289 Encounter for other administrative examinations: Secondary | ICD-10-CM

## 2013-01-09 ENCOUNTER — Encounter: Payer: BC Managed Care – PPO | Admitting: Family Medicine

## 2013-02-15 ENCOUNTER — Telehealth: Payer: Self-pay | Admitting: Family Medicine

## 2013-02-15 MED ORDER — INSULIN PEN NEEDLE 32G X 6 MM MISC: Status: DC

## 2013-02-15 MED ORDER — LIRAGLUTIDE 18 MG/3ML ~~LOC~~ SOLN: 1.2000 mg | Freq: Every day | SUBCUTANEOUS | Status: DC

## 2013-02-15 NOTE — Telephone Encounter (Signed)
Pt presented while I was at lunch.  3 samples given to pt and I told him I would call in med and needles to pharmacy this afternoon when I return from lunch.  RX sent to pharmacy.

## 2013-02-15 NOTE — Telephone Encounter (Signed)
I have attempted to contact this patient by phone with the following results: left message to return my call on answering machine (home).  

## 2013-03-27 ENCOUNTER — Ambulatory Visit (INDEPENDENT_AMBULATORY_CARE_PROVIDER_SITE_OTHER): Payer: BC Managed Care – PPO | Admitting: Family Medicine

## 2013-03-27 ENCOUNTER — Encounter: Payer: Self-pay | Admitting: Family Medicine

## 2013-03-27 ENCOUNTER — Ambulatory Visit: Payer: BC Managed Care – PPO | Admitting: Family Medicine

## 2013-03-27 VITALS — BP 110/80 | HR 76 | Ht 70.0 in | Wt 213.0 lb

## 2013-03-27 DIAGNOSIS — D126 Benign neoplasm of colon, unspecified: Secondary | ICD-10-CM

## 2013-03-27 DIAGNOSIS — E785 Hyperlipidemia, unspecified: Secondary | ICD-10-CM

## 2013-03-27 LAB — BASIC METABOLIC PANEL
Calcium: 9 mg/dL (ref 8.4–10.5)
GFR: 79.64 mL/min (ref >60.00)
Glucose, Bld: 136 mg/dL — ABNORMAL HIGH (ref 70–99)
Sodium: 136 mEq/L (ref 135–145)

## 2013-03-27 LAB — HEMOGLOBIN A1C: Hgb A1c MFr Bld: 7.5 % — ABNORMAL HIGH (ref 4.6–6.5)

## 2013-03-27 LAB — LIPID PANEL
Cholesterol: 169 mg/dL (ref 0–200)
HDL: 36.9 mg/dL — ABNORMAL LOW (ref >39.00)

## 2013-03-27 NOTE — Progress Notes (Signed)
OFFICE NOTE  03/27/2013  CC:  Chief Complaint  Patient presents with  . Follow-up    DM, HTN     HPI: Patient is a 62 y.o. Caucasian male who is here for 5 mo f/u DM 2 and hyperlipidemia. He feels good.  He has been working on eating a more heart healthy diet.  Energy level good. He says he is taking 0.6mg  Victoza SQ, not the full 1.2 mg dose.  He is compliant with all other meds.  Pertinent PMH:  Past Medical History  Diagnosis Date  . Diabetes mellitus   . Hyperlipidemia   . History of hemorrhoids   . Diverticulosis of colon   . Diverticulitis     hospitalized  . Adenomatous colon polyp 2008    Dr. Virginia Rochester  . Shoulder pain, right      Injection on 2 consec mo at [facility] (Dr. August Saucer) 2012  . History of ETT     LOW RISK/NEG ETT 02/2011 w/Dr. Deborah Chalk  . Nephrolithiasis     Saw urologist in [facility] past ([facility])  . Renal cyst, left     hemorrhagic vs proteinacious (last f/u CT 2007 showed stability)--Dr. Virginia Rochester.   Past Surgical History  Procedure Laterality Date  . Hemorrhoid surgery      MEDS:  Outpatient Prescriptions Prior to Visit  Medication Sig Dispense Refill  . cetirizine (ZYRTEC) 10 MG tablet Take 10 mg by mouth daily.       . hydrochlorothiazide (HYDRODIURIL) 25 MG tablet Take 1 tablet (25 mg total) by mouth daily.  60 tablet  3  . Insulin Pen Needle 32G X 6 MM MISC Use as directed with Victoza  100 each  1  . Liraglutide (VICTOZA) 18 MG/3ML SOLN injection Inject 0.2 mLs (1.2 mg total) into the skin daily before breakfast.  6 mL  1  . metFORMIN (GLUCOPHAGE) 1000 MG tablet 1 tab po bid  180 tablet  3  . rosuvastatin (CRESTOR) 10 MG tablet Take 1 tablet (10 mg total) by mouth daily.  60 tablet  3  . Glucose Blood (FREESTYLE LITE TEST VI)       . glucose blood (FREESTYLE LITE) test strip Check glucose twice per day  60 each  12  . ibuprofen (ADVIL,MOTRIN) 600 MG tablet 1 tab po bid prn pain--take with food  30 tablet  3  . pantoprazole (PROTONIX) 40 MG tablet  Take 1 tablet (40 mg total) by mouth daily.  60 tablet  5  . phentermine 37.5 MG capsule Take 37.5 mg by mouth daily.        . sildenafil (VIAGRA) 100 MG tablet 1/2-1 tab po 30 min prior to intercourse; use once daily maximum  5 tablet  5   No facility-administered medications prior to visit.    PE: Blood pressure 110/80, pulse 76, height 5\' 10"  (1.778 m), weight 213 lb (96.616 kg). Gen: Alert, well appearing.  Patient is oriented to person, place, time, and situation. CV: RRR, no m/r/g.   LUNGS: CTA bilat, nonlabored resps, good aeration in all lung fields.   IMPRESSION AND PLAN:  Type II or unspecified type diabetes mellitus without mention of complication, uncontrolled Compliant with 0.6mg  SQ dosing of victoza and 1000 mg bid metformin.  Noncompliant with monitoring. Diet is ok. Check HbA1c today.  HYPERLIPIDEMIA Diet is ok at best. Due for repeat FLP today.  Adenomatous colon polyp He is overdue for repeat. I have already referred him to  GI and he had to  cancel his initial appt. I gave him their number [phone]) so he could call them and reschedule colonoscopy.   An After Visit Summary was printed and given to the patient.  FOLLOW UP: 46mo

## 2013-03-27 NOTE — Patient Instructions (Addendum)
Call [facility] [phone] to reschedule your colonoscopy.

## 2013-03-28 ENCOUNTER — Encounter: Payer: Self-pay | Admitting: Family Medicine

## 2013-03-28 MED ORDER — ROSUVASTATIN CALCIUM 20 MG PO TABS: 20.0000 mg | ORAL_TABLET | Freq: Every day | ORAL | Status: DC

## 2013-03-28 NOTE — Assessment & Plan Note (Signed)
He is overdue for repeat. I have already referred him to [facility] and he had to cancel his initial appt. I gave him their number [phone]) so he could call them and reschedule colonoscopy.

## 2013-03-28 NOTE — Assessment & Plan Note (Signed)
Diet is ok at best. Due for repeat FLP today.

## 2013-03-28 NOTE — Assessment & Plan Note (Signed)
Compliant with 0.6mg  SQ dosing of victoza and 1000 mg bid metformin.  Noncompliant with monitoring. Diet is ok. Check HbA1c today.

## 2013-05-20 ENCOUNTER — Encounter: Payer: Self-pay | Admitting: Family Medicine

## 2013-05-20 ENCOUNTER — Ambulatory Visit (INDEPENDENT_AMBULATORY_CARE_PROVIDER_SITE_OTHER): Payer: BC Managed Care – PPO | Admitting: Family Medicine

## 2013-05-20 VITALS — BP 114/81 | HR 79 | Temp 97.9°F | Resp 14 | Wt 209.5 lb

## 2013-05-20 DIAGNOSIS — K529 Noninfective gastroenteritis and colitis, unspecified: Secondary | ICD-10-CM

## 2013-05-20 DIAGNOSIS — K5289 Other specified noninfective gastroenteritis and colitis: Secondary | ICD-10-CM

## 2013-05-20 MED ORDER — PROMETHAZINE HCL 25 MG RE SUPP: 25.0000 mg | Freq: Four times a day (QID) | RECTAL | Status: DC | PRN

## 2013-05-20 MED ORDER — PROMETHAZINE HCL 25 MG PO TABS: 25.0000 mg | ORAL_TABLET | Freq: Four times a day (QID) | ORAL | Status: DC | PRN

## 2013-05-20 NOTE — Progress Notes (Signed)
OFFICE NOTE  05/20/2013  CC:  Chief Complaint  Patient presents with  . Diarrhea    Pt c/o nausea w/vomiting & diarrhea, chills & sweats x4 days     HPI: Patient is a 63 y.o. Caucasian male who is here for n/v/d. Onset 3 d/a with frequent watery diarrhea, vomiting followed.  Still nauseated today but no vomiting in last 24h b/c he's drinking only coca cola.  Sips of water come back up. Still with watery BM q15-30 min.  No blood or pus in BM's.  Subjective fever on first night but none since.  No one else that ate similar foods or has been in contact with him is ill. No recent camping. He went to [place] last week and the week before-visited nursing home where his mother is currently. He hasn't taken any anti-diarrhea med. His wife gave him an oral dose of her nausea med and he threw it up. Not feeling any improved today.  ROS: no cough, ST, HA, or URI sx's;  +body aches and fatigue  Pertinent PMH:  Past Medical History  Diagnosis Date  . Diabetes mellitus   . Hyperlipidemia   . History of hemorrhoids   . Diverticulosis of colon   . Diverticulitis     hospitalized  . Adenomatous colon polyp 2008    Dr. Virginia Rochester  . Shoulder pain, right      Injection on 2 consec mo at [facility] (Dr. August Saucer) 2012  . History of ETT     LOW RISK/NEG ETT 02/2011 w/Dr. Deborah Chalk  . Nephrolithiasis     Saw urologist [facility] past ([facility])  . Renal cyst, left     hemorrhagic vs proteinacious (last f/u CT 2007 showed stability)--Dr. Virginia Rochester.   Past surgical, social, and family history reviewed and no changes noted since last office visit.  MEDS:  Outpatient Prescriptions Prior to Visit  Medication Sig Dispense Refill  . cetirizine (ZYRTEC) 10 MG tablet Take 10 mg by mouth daily.       . hydrochlorothiazide (HYDRODIURIL) 25 MG tablet Take 1 tablet (25 mg total) by mouth daily.  60 tablet  3  . ibuprofen (ADVIL,MOTRIN) 600 MG tablet 1 tab po bid prn pain--take with food  30 tablet  3  . Insulin Pen  Needle 32G X 6 MM MISC Use as directed with Victoza  100 each  1  . Liraglutide (VICTOZA) 18 MG/3ML SOLN injection Inject 0.2 mLs (1.2 mg total) into the skin daily before breakfast.  6 mL  1  . metFORMIN (GLUCOPHAGE) 1000 MG tablet 1 tab po bid  180 tablet  3  . pantoprazole (PROTONIX) 40 MG tablet Take 1 tablet (40 mg total) by mouth daily.  60 tablet  5  . rosuvastatin (CRESTOR) 20 MG tablet Take 1 tablet (20 mg total) by mouth daily.  90 tablet  0  . Glucose Blood (FREESTYLE LITE TEST VI)       . phentermine 37.5 MG capsule Take 37.5 mg by mouth daily.        . sildenafil (VIAGRA) 100 MG tablet 1/2-1 tab po 30 min prior to intercourse; use once daily maximum  5 tablet  5   No facility-administered medications prior to visit.    PE: Blood pressure 114/81, pulse 79, temperature 97.9 F (36.6 C), temperature source Oral, resp. rate 14, weight 209 lb 8 oz (95.029 kg), SpO2 96.00%. Gen: Alert, well appearing.  Patient is oriented to person, place, time, and situation. ENT: Eyes: no injection,  icteris, swelling, or exudate.  EOMI, PERRLA. Nose: no drainage or turbinate edema/swelling.  No injection or focal lesion.  Mouth: lips without lesion/swelling.  Oral mucosa pink and moist.  Dentition intact and without obvious caries or gingival swelling.  Oropharynx without erythema, exudate, or swelling.  Neck - No masses or thyromegaly or limitation in range of motion CV: RRR, no m/r/g.   LUNGS: CTA bilat, nonlabored resps, good aeration in all lung fields. ABD: soft, NT, ND, BS normal.  No hepatospenomegaly or mass.  No bruits. EXT: no clubbing, cyanosis, or edema.  Skin - no sores or suspicious lesions or rashes or color changes  IMPRESSION AND PLAN:  Gastroenteritis Likely viral. Mildly dehydrated. No stool studies or blood testing at this time. Phenergan rx'd, discussed.  OTC imodium discussed. Oral rehydration plan discussed. Signs/symptoms to call or return for were reviewed and pt  expressed understanding.   Recommended hold all of his current chronic oral meds (and even his Victoza) until feeling back to normal.   FOLLOW UP: prn

## 2013-05-20 NOTE — Assessment & Plan Note (Signed)
Likely viral. Mildly dehydrated. No stool studies or blood testing at this time. Phenergan rx'd, discussed.  OTC imodium discussed. Oral rehydration plan discussed. Signs/symptoms to call or return for were reviewed and pt expressed understanding.

## 2013-06-26 ENCOUNTER — Other Ambulatory Visit: Payer: Self-pay | Admitting: *Deleted

## 2013-06-26 ENCOUNTER — Other Ambulatory Visit: Payer: Self-pay | Admitting: Family Medicine

## 2013-06-26 MED ORDER — PANTOPRAZOLE SODIUM 40 MG PO TBEC: 40.0000 mg | DELAYED_RELEASE_TABLET | Freq: Every day | ORAL | Status: DC

## 2013-06-27 MED ORDER — PANTOPRAZOLE SODIUM 40 MG PO TBEC: 40.0000 mg | DELAYED_RELEASE_TABLET | Freq: Every day | ORAL | Status: DC

## 2013-10-11 ENCOUNTER — Other Ambulatory Visit: Payer: Self-pay | Admitting: Family Medicine

## 2013-10-11 MED ORDER — HYDROCHLOROTHIAZIDE 25 MG PO TABS: 25.0000 mg | ORAL_TABLET | Freq: Every day | ORAL | Status: DC

## 2013-10-14 ENCOUNTER — Other Ambulatory Visit: Payer: Self-pay | Admitting: Family Medicine

## 2013-10-14 MED ORDER — HYDROCHLOROTHIAZIDE 25 MG PO TABS: 25.0000 mg | ORAL_TABLET | Freq: Every day | ORAL | Status: DC

## 2013-12-19 DIAGNOSIS — K760 Fatty (change of) liver, not elsewhere classified: Secondary | ICD-10-CM

## 2013-12-19 HISTORY — DX: Fatty (change of) liver, not elsewhere classified: K76.0

## 2013-12-25 ENCOUNTER — Other Ambulatory Visit: Payer: Self-pay | Admitting: Family Medicine

## 2013-12-25 ENCOUNTER — Telehealth: Payer: Self-pay | Admitting: Family Medicine

## 2013-12-25 MED ORDER — OSELTAMIVIR PHOSPHATE 75 MG PO CAPS: ORAL_CAPSULE | ORAL | Status: DC

## 2013-12-25 NOTE — Telephone Encounter (Signed)
Spoke with pt today. He has been around family members with Flu-like illnesses lately.  He is currently asymptomatic. I will eRx daily tamiflu 75mg  x 10d for him. Also, he requested samples of victoza so I set aside sample pens for him to come pick up.

## 2013-12-25 NOTE — Telephone Encounter (Signed)
Patient states his whole family is sick with bronchitis & his mother is dying. He is afraid to be around her.  Patient is requesting CB from Dr. Anitra Lauth. Can he get a Rx for antibiotic from [facility]?

## 2013-12-25 NOTE — Telephone Encounter (Signed)
Please advise 

## 2014-01-01 ENCOUNTER — Ambulatory Visit (INDEPENDENT_AMBULATORY_CARE_PROVIDER_SITE_OTHER): Payer: BC Managed Care – PPO | Admitting: Family Medicine

## 2014-01-01 ENCOUNTER — Other Ambulatory Visit: Payer: Self-pay | Admitting: Family Medicine

## 2014-01-01 DIAGNOSIS — S76311A Strain of muscle, fascia and tendon of the posterior muscle group at thigh level, right thigh, initial encounter: Secondary | ICD-10-CM

## 2014-01-01 DIAGNOSIS — IMO0002 Reserved for concepts with insufficient information to code with codable children: Secondary | ICD-10-CM

## 2014-01-01 MED ORDER — HYDROCODONE-ACETAMINOPHEN 5-325 MG PO TABS: ORAL_TABLET | ORAL | Status: DC

## 2014-01-01 MED ORDER — DICLOFENAC SODIUM 75 MG PO TBEC: 75.0000 mg | DELAYED_RELEASE_TABLET | Freq: Two times a day (BID) | ORAL | Status: DC

## 2014-01-02 ENCOUNTER — Encounter: Payer: Self-pay | Admitting: Family Medicine

## 2014-01-02 DIAGNOSIS — S76311A Strain of muscle, fascia and tendon of the posterior muscle group at thigh level, right thigh, initial encounter: Secondary | ICD-10-CM | POA: Insufficient documentation

## 2014-01-02 NOTE — Assessment & Plan Note (Signed)
Thigh sleave fitted/dispensed in office today. Diclofenac 75mg  bid x 14d with food--try to minimize wt bearing on right leg for the next several days. Ice the region x 3d. Vicodin 5/325, 1-2 q6h prn severe pain, #30, no RF.

## 2014-01-02 NOTE — Progress Notes (Signed)
OFFICE NOTE  01/02/2014  CC: Right hamstring pain   HPI: Patient is a 63 y.o. Caucasian male who is here for pain in back of right upper leg, onset today about 8 hours ago when he slipped awkwardly after picking something up, felt a "tearing" pain in right hamstring area.  Has hurt constantly, made worse by walking and flexing right knee.  Pertinent PMH:  Past Medical History  Diagnosis Date  . Diabetes mellitus   . Hyperlipidemia   . History of hemorrhoids   . Diverticulosis of colon   . Diverticulitis     hospitalized  . Adenomatous colon polyp 2008    Dr. Lajoyce Corners  . Shoulder pain, right      Injection on 2 consec mo at [facility] (Dr. Marlou Sa) 2012  . History of ETT     LOW RISK/NEG ETT 02/2011 w/Dr. Doreatha Lew  . Nephrolithiasis     Saw urologist in [place] past (?[facility])  . Renal cyst, left     hemorrhagic vs proteinacious (last f/u CT 2007 showed stability)--Dr. Lajoyce Corners.    MEDS:  Outpatient Prescriptions Prior to Visit  Medication Sig Dispense Refill  . cetirizine (ZYRTEC) 10 MG tablet Take 10 mg by mouth daily.       . diclofenac (VOLTAREN) 75 MG EC tablet Take 1 tablet (75 mg total) by mouth 2 (two) times daily.  30 tablet  0  . Glucose Blood (FREESTYLE LITE TEST VI)       . hydrochlorothiazide (HYDRODIURIL) 25 MG tablet Take 1 tablet (25 mg total) by mouth daily.  90 tablet  1  . HYDROcodone-acetaminophen (NORCO/VICODIN) 5-325 MG per tablet 1-2 tabs po q6h prn pain  30 tablet  0  . ibuprofen (ADVIL,MOTRIN) 600 MG tablet 1 tab po bid prn pain--take with food  30 tablet  3  . Insulin Pen Needle 32G X 6 MM MISC Use as directed with Victoza  100 each  1  . Liraglutide (VICTOZA) 18 MG/3ML SOLN injection Inject 0.2 mLs (1.2 mg total) into the skin daily before breakfast.  6 mL  1  . metFORMIN (GLUCOPHAGE) 1000 MG tablet 1 tab po bid  180 tablet  3  . oseltamivir (TAMIFLU) 75 MG capsule 1 tab po qd x 10d  10 capsule  0  . pantoprazole (PROTONIX) 40 MG tablet Take 1 tablet (40 mg  total) by mouth daily.  60 tablet  5  . phentermine 37.5 MG capsule Take 37.5 mg by mouth daily.        . promethazine (PHENERGAN) 25 MG suppository Place 1 suppository (25 mg total) rectally every 6 (six) hours as needed for nausea.  6 each  0  . promethazine (PHENERGAN) 25 MG tablet Take 1 tablet (25 mg total) by mouth every 6 (six) hours as needed for nausea.  20 tablet  0  . rosuvastatin (CRESTOR) 20 MG tablet Take 1 tablet (20 mg total) by mouth daily.  90 tablet  0   No facility-administered medications prior to visit.    PE: There were no vitals taken for this visit. Gen: Alert, well appearing.  Patient is oriented to person, place, time, and situation. Right gluteal region nontender.  Right mid and lower hamstring region diffusely TTP.  Pain in right hamstring with resisted extension of right hip and with resisted flexion at the right knee.  IMPRESSION AND PLAN:  Right hamstring muscle strain Thigh sleave fitted/dispensed in office today. Diclofenac 75mg  bid x 14d with food--try to minimize wt bearing  on right leg for the next several days. Ice the region x 3d. Vicodin 5/325, 1-2 q6h prn severe pain, #30, no RF.   An After Visit Summary was printed and given to the patient.  FOLLOW UP: prn

## 2014-02-14 ENCOUNTER — Telehealth: Payer: Self-pay | Admitting: Family Medicine

## 2014-02-14 MED ORDER — HYDROCODONE-HOMATROPINE 5-1.5 MG/5ML PO SYRP: ORAL_SOLUTION | ORAL | Status: DC

## 2014-02-14 NOTE — Telephone Encounter (Signed)
Rx put at front desk for patient pick up instead.

## 2014-02-14 NOTE — Telephone Encounter (Signed)
Rx faxed

## 2014-02-14 NOTE — Telephone Encounter (Signed)
(  Pt well known to me. Pt called with c/o cough, requested rx cough med.  Says no fever or SOB. I recommended mucinex DM daytime, and I sent rx for hycodan susp for hs use.  Recommended he come in for o/v if not improving in 5-6d, if starting to get fever, or if he gets wheezing or SOB.)  Lattie Haw, pls fax hycodan rx to [facility] in [place].-thx

## 2014-02-26 ENCOUNTER — Other Ambulatory Visit: Payer: Self-pay | Admitting: Family Medicine

## 2014-03-28 ENCOUNTER — Other Ambulatory Visit: Payer: Self-pay | Admitting: Family Medicine

## 2014-06-16 ENCOUNTER — Encounter: Payer: Self-pay | Admitting: Family Medicine

## 2014-06-16 ENCOUNTER — Ambulatory Visit (INDEPENDENT_AMBULATORY_CARE_PROVIDER_SITE_OTHER): Payer: BC Managed Care – PPO | Admitting: Family Medicine

## 2014-06-16 VITALS — BP 139/89 | HR 79 | Temp 97.6°F | Resp 18 | Ht 70.0 in | Wt 213.0 lb

## 2014-06-16 DIAGNOSIS — H00019 Hordeolum externum unspecified eye, unspecified eyelid: Secondary | ICD-10-CM

## 2014-06-16 DIAGNOSIS — H109 Unspecified conjunctivitis: Secondary | ICD-10-CM

## 2014-06-16 DIAGNOSIS — H00013 Hordeolum externum right eye, unspecified eyelid: Secondary | ICD-10-CM

## 2014-06-16 MED ORDER — ERYTHROMYCIN 5 MG/GM OP OINT: TOPICAL_OINTMENT | OPHTHALMIC | Status: DC

## 2014-06-16 NOTE — Progress Notes (Signed)
Pre visit review using our clinic review tool, if applicable. No additional management support is needed unless otherwise documented below in the visit note. 

## 2014-06-16 NOTE — Progress Notes (Signed)
OFFICE NOTE  06/16/2014  CC:  Chief Complaint  Patient presents with  . Eye Problem   HPI: Patient is a 63 y.o. Caucasian male who is here for recent red and swollen right eye.  Onset last 4-5d. Some gummy exudate-mild, irritated feeling, makes him rub it a lot.  Swollen lately in inner corner of upper lid. Left eye was similar earlier in the week but wasn't swollen.  Left eye better now.   Pertinent PMH:  Past medical, surgical, social, and family history reviewed and no changes are noted since last office visit.  MEDS:  Outpatient Prescriptions Prior to Visit  Medication Sig Dispense Refill  . cetirizine (ZYRTEC) 10 MG tablet Take 10 mg by mouth daily.       . diclofenac (VOLTAREN) 75 MG EC tablet Take 1 tablet (75 mg total) by mouth 2 (two) times daily.  30 tablet  0  . Glucose Blood (FREESTYLE LITE TEST VI)       . hydrochlorothiazide (HYDRODIURIL) 25 MG tablet Take 1 tablet (25 mg total) by mouth daily.  90 tablet  1  . HYDROcodone-homatropine (HYCODAN) 5-1.5 MG/5ML syrup 1-2 tsp po qhs as needed for cough  120 mL  0  . ibuprofen (ADVIL,MOTRIN) 600 MG tablet 1 tab po bid prn pain--take with food  30 tablet  3  . Insulin Pen Needle 32G X 6 MM MISC Use as directed with Victoza  100 each  1  . Liraglutide (VICTOZA) 18 MG/3ML SOLN injection Inject 0.2 mLs (1.2 mg total) into the skin daily before breakfast.  6 mL  1  . metFORMIN (GLUCOPHAGE) 1000 MG tablet TAKE 1 TABLET BY MOUTH TWICE DAILY  60 tablet  3  . pantoprazole (PROTONIX) 40 MG tablet Take 1 tablet (40 mg total) by mouth daily.  60 tablet  5  . phentermine 37.5 MG capsule Take 37.5 mg by mouth daily.        . promethazine (PHENERGAN) 25 MG suppository Place 1 suppository (25 mg total) rectally every 6 (six) hours as needed for nausea.  6 each  0  . promethazine (PHENERGAN) 25 MG tablet Take 1 tablet (25 mg total) by mouth every 6 (six) hours as needed for nausea.  20 tablet  0  . rosuvastatin (CRESTOR) 20 MG tablet Take 1  tablet (20 mg total) by mouth daily.  90 tablet  0   No facility-administered medications prior to visit.    PE: Blood pressure 139/89, pulse 79, temperature 97.6 F (36.4 C), temperature source Temporal, resp. rate 18, height 5\' 10"  (1.778 m), weight 213 lb (96.616 kg), SpO2 95.00%. Gen: Alert, well appearing.  Patient is oriented to person, place, time, and situation. Left eye conjunctival injection, no foreign body, no exudate.  No signif bulbar conjunct injection.  No left eyelid swelling. Right eyelid with mild diffuse swelling of upper lid, medial canthus region with focal swelling on inner/under portion--near base of eye lash.  Minimal exudate, with diffuse bulbar and palpebral conjunc injection. EOMI, PERRLA.  IMPRESSION AND PLAN:  Right eye stye, conjunctivitis. Erythromycin ointment, warm compresses with J&J baby shampoo soaks at least daily.  FOLLOW UP: prn

## 2014-06-27 ENCOUNTER — Other Ambulatory Visit: Payer: Self-pay | Admitting: Family Medicine

## 2014-06-27 ENCOUNTER — Telehealth: Payer: Self-pay | Admitting: Family Medicine

## 2014-06-27 NOTE — Telephone Encounter (Signed)
Diabetic Bundling BP high, no LDL or A1C done in the last year. LM asking patient to schedule CPE.

## 2014-07-01 ENCOUNTER — Other Ambulatory Visit: Payer: Self-pay | Admitting: Family Medicine

## 2014-07-04 ENCOUNTER — Other Ambulatory Visit: Payer: Self-pay | Admitting: Family Medicine

## 2014-07-04 MED ORDER — PANTOPRAZOLE SODIUM 40 MG PO TBEC: 40.0000 mg | DELAYED_RELEASE_TABLET | Freq: Every day | ORAL | Status: DC

## 2014-08-07 ENCOUNTER — Other Ambulatory Visit: Payer: Self-pay | Admitting: Family Medicine

## 2014-08-07 MED ORDER — TYPHOID VACCINE PO CPDR: DELAYED_RELEASE_CAPSULE | ORAL | Status: DC

## 2014-08-07 MED ORDER — ATOVAQUONE-PROGUANIL HCL 250-100 MG PO TABS: ORAL_TABLET | ORAL | Status: DC

## 2014-08-11 ENCOUNTER — Ambulatory Visit (INDEPENDENT_AMBULATORY_CARE_PROVIDER_SITE_OTHER): Payer: BC Managed Care – PPO | Admitting: Family Medicine

## 2014-08-11 ENCOUNTER — Encounter: Payer: Self-pay | Admitting: Family Medicine

## 2014-08-11 VITALS — BP 132/90 | HR 92 | Temp 97.6°F | Resp 18 | Ht 70.0 in | Wt 213.0 lb

## 2014-08-11 DIAGNOSIS — R030 Elevated blood-pressure reading, without diagnosis of hypertension: Secondary | ICD-10-CM

## 2014-08-11 DIAGNOSIS — Z7184 Encounter for health counseling related to travel: Secondary | ICD-10-CM | POA: Insufficient documentation

## 2014-08-11 DIAGNOSIS — E785 Hyperlipidemia, unspecified: Secondary | ICD-10-CM | POA: Insufficient documentation

## 2014-08-11 DIAGNOSIS — Z7189 Other specified counseling: Secondary | ICD-10-CM

## 2014-08-11 DIAGNOSIS — IMO0001 Reserved for inherently not codable concepts without codable children: Secondary | ICD-10-CM

## 2014-08-11 DIAGNOSIS — D126 Benign neoplasm of colon, unspecified: Secondary | ICD-10-CM

## 2014-08-11 DIAGNOSIS — E1165 Type 2 diabetes mellitus with hyperglycemia: Principal | ICD-10-CM

## 2014-08-11 DIAGNOSIS — Z23 Encounter for immunization: Secondary | ICD-10-CM

## 2014-08-11 LAB — COMPREHENSIVE METABOLIC PANEL
ALBUMIN: 4.1 g/dL (ref 3.5–5.2)
ALT: 41 U/L (ref 0–53)
AST: 26 U/L (ref 0–37)
Alkaline Phosphatase: 52 U/L (ref 39–117)
BUN: 15 mg/dL (ref 6–23)
CALCIUM: 9.5 mg/dL (ref 8.4–10.5)
CHLORIDE: 97 meq/L (ref 96–112)
CO2: 29 meq/L (ref 19–32)
CREATININE: 1 mg/dL (ref 0.4–1.5)
GFR: 79.28 mL/min (ref >60.00)
GLUCOSE: 147 mg/dL — AB (ref 70–99)
POTASSIUM: 4.2 meq/L (ref 3.5–5.1)
Sodium: 135 mEq/L (ref 135–145)
Total Bilirubin: 0.8 mg/dL (ref 0.2–1.2)
Total Protein: 7.1 g/dL (ref 6.0–8.3)

## 2014-08-11 LAB — CBC WITH DIFFERENTIAL/PLATELET
BASOS PCT: 0.3 % (ref 0.0–3.0)
Basophils Absolute: 0 10*3/uL (ref 0.0–0.1)
EOS ABS: 0.1 10*3/uL (ref 0.0–0.7)
Eosinophils Relative: 1.8 % (ref 0.0–5.0)
HCT: 42.8 % (ref 39.0–52.0)
Hemoglobin: 14.4 g/dL (ref 13.0–17.0)
LYMPHS PCT: 21.6 % (ref 12.0–46.0)
Lymphs Abs: 1.2 10*3/uL (ref 0.7–4.0)
MCHC: 33.7 g/dL (ref 30.0–36.0)
MCV: 91 fl (ref 78.0–100.0)
MONO ABS: 0.3 10*3/uL (ref 0.1–1.0)
Monocytes Relative: 5.2 % (ref 3.0–12.0)
NEUTROS PCT: 71.1 % (ref 43.0–77.0)
Neutro Abs: 4.1 10*3/uL (ref 1.4–7.7)
PLATELETS: 221 10*3/uL (ref 150.0–400.0)
RBC: 4.7 Mil/uL (ref 4.22–5.81)
RDW: 13.1 % (ref 11.5–15.5)
WBC: 5.7 10*3/uL (ref 4.0–10.5)

## 2014-08-11 LAB — LIPID PANEL
CHOL/HDL RATIO: 7
CHOLESTEROL: 299 mg/dL — AB (ref 0–200)
HDL: 42.9 mg/dL (ref >39.00)
NonHDL: 256.1
TRIGLYCERIDES: 430 mg/dL — AB (ref 0.0–149.0)
VLDL: 86 mg/dL — ABNORMAL HIGH (ref 0.0–40.0)

## 2014-08-11 LAB — HEMOGLOBIN A1C: Hgb A1c MFr Bld: 8.6 % — ABNORMAL HIGH (ref 4.6–6.5)

## 2014-08-11 LAB — TSH: TSH: 1.18 u[IU]/mL (ref 0.35–4.50)

## 2014-08-11 LAB — MICROALBUMIN / CREATININE URINE RATIO
CREATININE, U: 123.5 mg/dL
MICROALB UR: 2.3 mg/dL — AB (ref 0.0–1.9)
MICROALB/CREAT RATIO: 1.9 mg/g (ref 0.0–30.0)

## 2014-08-11 MED ORDER — ATORVASTATIN CALCIUM 40 MG PO TABS: 40.0000 mg | ORAL_TABLET | Freq: Every day | ORAL | Status: DC

## 2014-08-11 NOTE — Assessment & Plan Note (Signed)
Will ask [facility] for records.  Pt states no diab retinopathy on most recent screen about 6 mo ago. Recheck HbA1c today, as well as urine microalb/cr. Feet exam normal today. Increase victoza to therapeutic dosing: 1.2 mg SQ qd.  Continue metformin 1000 mg bid.

## 2014-08-11 NOTE — Progress Notes (Signed)
Pre visit review using our clinic review tool, if applicable. No additional management support is needed unless otherwise documented below in the visit note. 

## 2014-08-11 NOTE — Assessment & Plan Note (Signed)
Oral typhoid vaccine rx'd already, as has the malaria prophylaxis med (see current med list). Hep A vaccine #1 today, needs booster in 6 mo. Flu vaccine IM today. Wrote letter at pt's request saying he was healthy enough for this trip and was UTD on vaccines and had no communicable diseases.

## 2014-08-11 NOTE — Progress Notes (Signed)
OFFICE NOTE  08/11/2014  CC:  Chief Complaint  Patient presents with  . Annual Exam   HPI: Patient is a 63 y.o. Caucasian male who is here for f/u DM 2, hyperlipidemia, overweight. He never followed up for chronic illness/med check after his last appt on 03/2013. He is past due for routine labs, foot exam.  Says most recent eye exam was about 6 mo ago at [facility]---need records.  Says he is feeling good.  He plans on going to [place] in about a month for 9 days, asks about what meds/vaccines are needed: I have already sent the malaria prophylaxis med and the oral typhoid vaccine to his pharmacy after he called earlier last week. He needs Hep A vaccine and flu vaccine.  Last gluc he checked was a few weeks ago and it was 125.  He rarely checks sugar. No bp checks at home.   He did not increase his victoza to 1.2 after last visit like we asked him to, has no reason why.  Takes metformin 1000 mg bid. Says his crestor was causing lower leg cramps so he stopped it, unknown amount of time ago.  No burning, tingling, or numbness in feet.  No hx of foot ulcer.  Pertinent PMH:  Past medical, surgical, social, and family history reviewed and no changes are noted since last office visit.  MEDS:  Outpatient Prescriptions Prior to Visit  Medication Sig Dispense Refill  . cetirizine (ZYRTEC) 10 MG tablet Take 10 mg by mouth daily.       . hydrochlorothiazide (HYDRODIURIL) 25 MG tablet TAKE 1 TABLET BY MOUTH DAILY  90 tablet  0  . Liraglutide (VICTOZA) 18 MG/3ML SOLN injection Inject 0.2 mLs (1.2 mg total) into the skin daily before breakfast.  6 mL  1  . metFORMIN (GLUCOPHAGE) 1000 MG tablet TAKE 1 TABLET BY MOUTH TWICE DAILY  60 tablet  3  . atovaquone-proguanil (MALARONE) 250-100 MG TABS 1 tab po qd starting 2 days prior to leaving for [place] and continue for 7d after returning from [place]  18 tablet  0  . diclofenac (VOLTAREN) 75 MG EC tablet Take 1 tablet (75 mg total) by mouth 2  (two) times daily.  30 tablet  0  . erythromycin ophthalmic ointment Apply 1/2 inch ribbon to each eye qid x 7d  3.5 g  1  . Glucose Blood (FREESTYLE LITE TEST VI)       . HYDROcodone-homatropine (HYCODAN) 5-1.5 MG/5ML syrup 1-2 tsp po qhs as needed for cough  120 mL  0  . ibuprofen (ADVIL,MOTRIN) 600 MG tablet 1 tab po bid prn pain--take with food  30 tablet  3  . Insulin Pen Needle 32G X 6 MM MISC Use as directed with Victoza  100 each  1  . pantoprazole (PROTONIX) 40 MG tablet Take 1 tablet (40 mg total) by mouth daily.  60 tablet  5  . phentermine 37.5 MG capsule Take 37.5 mg by mouth daily.        . promethazine (PHENERGAN) 25 MG suppository Place 1 suppository (25 mg total) rectally every 6 (six) hours as needed for nausea.  6 each  0  . promethazine (PHENERGAN) 25 MG tablet Take 1 tablet (25 mg total) by mouth every 6 (six) hours as needed for nausea.  20 tablet  0  . rosuvastatin (CRESTOR) 20 MG tablet Take 1 tablet (20 mg total) by mouth daily.  90 tablet  0  . typhoid (VIVOTIF) DR capsule  1 cap po qod x 4 doses.  Last dose should be finished 1 week prior to leaving for [place].  4 capsule  0   No facility-administered medications prior to visit.    PE: Blood pressure 132/90, pulse 92, temperature 97.6 F (36.4 C), temperature source Temporal, resp. rate 18, height 5\' 10"  (1.778 m), weight 213 lb (96.616 kg), SpO2 94.00%. Gen: Alert, well appearing.  Patient is oriented to person, place, time, and situation. AFFECT: pleasant, lucid thought and speech. Foot exam - bilateral normal; no swelling, tenderness or skin or vascular lesions. Color and temperature is normal. Sensation is intact. Peripheral pulses are palpable. Toenails are normal.  IMPRESSION AND PLAN:  Type II or unspecified type diabetes mellitus without mention of complication, uncontrolled Will ask [facility] for records.  Pt states no diab retinopathy on most recent screen about 6 mo ago. Recheck HbA1c today, as  well as urine microalb/cr. Feet exam normal today. Increase victoza to therapeutic dosing: 1.2 mg SQ qd.  Continue metformin 1000 mg bid.  Hyperlipidemia Intolerant of crestor 20mg  qd. Will start trial of atorvastatin 40mg  qd. Lipid panel today: he ate 1/2 saus biscuit 5 hours prior to this blood draw.  Travel advice encounter Oral typhoid vaccine rx'd already, as has the malaria prophylaxis med (see current med list). Hep A vaccine #1 today, needs booster in 6 mo. Flu vaccine IM today. Wrote letter at pt's request saying he was healthy enough for this trip and was UTD on vaccines and had no communicable diseases.  Adenomatous colon polyp Overdue for repeat colonoscopy. I have already referred him to [facility] and he had to cancel his initial appt. As per my note 03/28/13, I gave pt [facility] contact number so he could call and reschedule this but he has not done this. At next f/u visit in 4 mo I'll STRESS THE IMPORTANCE OF GETTING THIS DONE ASAP.   An After Visit Summary was printed and given to the patient.  FOLLOW UP: 10mo

## 2014-08-11 NOTE — Telephone Encounter (Signed)
Patient appt 08/11/14

## 2014-08-11 NOTE — Assessment & Plan Note (Addendum)
Intolerant of crestor 20mg  qd. Will start trial of atorvastatin 40mg  qd. Lipid panel today: he ate 1/2 saus biscuit 5 hours prior to this blood draw.

## 2014-08-11 NOTE — Assessment & Plan Note (Signed)
Overdue for repeat colonoscopy. I have already referred him to [facility] and he had to cancel his initial appt. As per my note 03/28/13, I gave pt [facility] contact number so he could call and reschedule this but he has not done this. At next f/u visit in 4 mo I'll STRESS THE IMPORTANCE OF GETTING THIS DONE ASAP.

## 2014-08-12 ENCOUNTER — Other Ambulatory Visit: Payer: Self-pay | Admitting: Family Medicine

## 2014-08-12 LAB — LDL CHOLESTEROL, DIRECT: Direct LDL: 178.6 mg/dL

## 2014-08-12 MED ORDER — ATORVASTATIN CALCIUM 40 MG PO TABS: 40.0000 mg | ORAL_TABLET | Freq: Every day | ORAL | Status: DC

## 2014-08-16 ENCOUNTER — Other Ambulatory Visit: Payer: Self-pay | Admitting: Family Medicine

## 2014-08-27 ENCOUNTER — Other Ambulatory Visit: Payer: Self-pay | Admitting: Family Medicine

## 2014-08-27 ENCOUNTER — Ambulatory Visit (INDEPENDENT_AMBULATORY_CARE_PROVIDER_SITE_OTHER): Payer: BC Managed Care – PPO | Admitting: Family Medicine

## 2014-08-27 ENCOUNTER — Ambulatory Visit (HOSPITAL_BASED_OUTPATIENT_CLINIC_OR_DEPARTMENT_OTHER)
Admission: RE | Admit: 2014-08-27 | Discharge: 2014-08-27 | Disposition: A | Discharge status: Home / Self Care | Payer: BC Managed Care – PPO | Source: Ambulatory Visit | Attending: Family Medicine | Admitting: Family Medicine

## 2014-08-27 ENCOUNTER — Encounter: Payer: Self-pay | Admitting: Family Medicine

## 2014-08-27 VITALS — BP 115/81 | HR 84 | Temp 98.4°F | Resp 18 | Ht 70.0 in | Wt 213.0 lb

## 2014-08-27 DIAGNOSIS — E1165 Type 2 diabetes mellitus with hyperglycemia: Principal | ICD-10-CM

## 2014-08-27 DIAGNOSIS — R3129 Other microscopic hematuria: Secondary | ICD-10-CM | POA: Insufficient documentation

## 2014-08-27 DIAGNOSIS — K7689 Other specified diseases of liver: Secondary | ICD-10-CM | POA: Diagnosis not present

## 2014-08-27 DIAGNOSIS — IMO0001 Reserved for inherently not codable concepts without codable children: Secondary | ICD-10-CM

## 2014-08-27 DIAGNOSIS — Q762 Congenital spondylolisthesis: Secondary | ICD-10-CM | POA: Insufficient documentation

## 2014-08-27 DIAGNOSIS — I7090 Unspecified atherosclerosis: Secondary | ICD-10-CM | POA: Diagnosis not present

## 2014-08-27 DIAGNOSIS — K5732 Diverticulitis of large intestine without perforation or abscess without bleeding: Secondary | ICD-10-CM | POA: Diagnosis not present

## 2014-08-27 DIAGNOSIS — R1030 Lower abdominal pain, unspecified: Secondary | ICD-10-CM

## 2014-08-27 DIAGNOSIS — R109 Unspecified abdominal pain: Secondary | ICD-10-CM

## 2014-08-27 LAB — POCT URINALYSIS DIPSTICK
BILIRUBIN UA: NEGATIVE
Glucose, UA: NEGATIVE
KETONES UA: NEGATIVE
Leukocytes, UA: NEGATIVE
Nitrite, UA: NEGATIVE
PH UA: 5
PROTEIN UA: NEGATIVE
SPEC GRAV UA: 1.02
Urobilinogen, UA: 0.2

## 2014-08-27 LAB — GLUCOSE, POCT (MANUAL RESULT ENTRY): POC Glucose: 131 mg/dl — AB (ref 70–99)

## 2014-08-27 MED ORDER — METRONIDAZOLE 500 MG PO TABS: 500.0000 mg | ORAL_TABLET | Freq: Three times a day (TID) | ORAL | Status: DC

## 2014-08-27 MED ORDER — CIPROFLOXACIN HCL 750 MG PO TABS: 750.0000 mg | ORAL_TABLET | Freq: Two times a day (BID) | ORAL | Status: DC

## 2014-08-27 MED ORDER — HYDROCODONE-ACETAMINOPHEN 5-325 MG PO TABS: 1.0000 | ORAL_TABLET | Freq: Four times a day (QID) | ORAL | Status: DC | PRN

## 2014-08-27 NOTE — Progress Notes (Addendum)
OFFICE NOTE  08/27/2014  CC:  Chief Complaint  Patient presents with  . Abdominal Pain   HPI: Patient is a 63 y.o. Caucasian male who is here for R flank pain on/off x 2 wks, radiating to groin, then it stopped and he now feels some pain in left groin, beneath scrotum area, also suprapubic area.  As per his usual, his history is difficult to obtain.  Urine stream not as strong.  No fevers. No gross hematuria.  Not feeling "good" last 3-4 days.  Vomited some yesterday, but none today. +Hx of kidney stones.  Denies past history of prostatitis.    Pertinent PMH:  Past medical, surgical, social, and family history reviewed and no changes are noted since last office visit.  MEDS: Not taking phentermine listed below Outpatient Prescriptions Prior to Visit  Medication Sig Dispense Refill  . atorvastatin (LIPITOR) 40 MG tablet Take 1 tablet (40 mg total) by mouth daily.  30 tablet  11  . atovaquone-proguanil (MALARONE) 250-100 MG TABS 1 tab po qd starting 2 days prior to leaving for [place] and continue for 7d after returning from [place]  18 tablet  0  . cetirizine (ZYRTEC) 10 MG tablet Take 10 mg by mouth daily.       . Glucose Blood (FREESTYLE LITE TEST VI)       . hydrochlorothiazide (HYDRODIURIL) 25 MG tablet TAKE 1 TABLET BY MOUTH DAILY  90 tablet  0  . Liraglutide (VICTOZA) 18 MG/3ML SOLN injection Inject 0.2 mLs (1.2 mg total) into the skin daily before breakfast.  6 mL  1  . metFORMIN (GLUCOPHAGE) 1000 MG tablet TAKE 1 TABLET BY MOUTH TWICE DAILY  60 tablet  3  . pantoprazole (PROTONIX) 40 MG tablet Take 1 tablet (40 mg total) by mouth daily.  60 tablet  5  . typhoid (VIVOTIF) DR capsule 1 cap po qod x 4 doses.  Last dose should be finished 1 week prior to leaving for [place].  4 capsule  0  . Insulin Pen Needle 32G X 6 MM MISC Use as directed with Victoza  100 each  1  . phentermine 37.5 MG capsule Take 37.5 mg by mouth daily.         No facility-administered medications prior to visit.     PE: Blood pressure 115/81, pulse 84, temperature 98.4 F (36.9 C), temperature source Temporal, resp. rate 18, height 5\' 10"  (1.778 m), weight 213 lb (96.616 kg), SpO2 94.00%. Gen: Alert, well appearing.  Patient is oriented to person, place, time, and situation. AFFECT: pleasant, lucid thought and speech. ABD: soft, NT/ND.  NO flank or CVA tenderness.  NO groin or suprapubic tenderness. DRE: normal tone, no mass, prostate is without nodule or enlargement or tenderness.  LAB: CC UA today showed trace intact blood. CBG today 131  IMPRESSION AND PLAN:  1) Recent flank pain--? Bilateral/alternating--now with intermittent suprapubic/bladder region pain. NO abnormality on exam.  Trace blood on urine dipstick. Will go ahead and check noncontrast CT for stones today. Vicodin 5/325, 1-2 q6h prn moderate pain, #30.  An After Visit Summary was printed and given to the patient.  FOLLOW UP: To be determined based on results of pending workup.   ADDENDUM 08/27/14: CT renal stone study today showed no stone but did show acute diverticulitis w/out abscess. Flagyl and cipro rx'd for pt.-PM

## 2014-08-27 NOTE — Progress Notes (Signed)
Pre visit review using our clinic review tool, if applicable. No additional management support is needed unless otherwise documented below in the visit note. 

## 2014-10-11 ENCOUNTER — Other Ambulatory Visit: Payer: Self-pay | Admitting: Family Medicine

## 2014-10-11 MED ORDER — LIRAGLUTIDE 18 MG/3ML ~~LOC~~ SOPN: PEN_INJECTOR | SUBCUTANEOUS | Status: DC

## 2014-11-02 ENCOUNTER — Other Ambulatory Visit: Payer: Self-pay | Admitting: Family Medicine

## 2014-12-02 ENCOUNTER — Ambulatory Visit (INDEPENDENT_AMBULATORY_CARE_PROVIDER_SITE_OTHER): Payer: BC Managed Care – PPO | Admitting: Family Medicine

## 2014-12-02 ENCOUNTER — Encounter: Payer: Self-pay | Admitting: Family Medicine

## 2014-12-02 VITALS — BP 134/93 | HR 73 | Temp 97.3°F | Ht 70.0 in | Wt 214.0 lb

## 2014-12-02 DIAGNOSIS — I10 Essential (primary) hypertension: Secondary | ICD-10-CM | POA: Insufficient documentation

## 2014-12-02 DIAGNOSIS — E119 Type 2 diabetes mellitus without complications: Secondary | ICD-10-CM | POA: Insufficient documentation

## 2014-12-02 DIAGNOSIS — E785 Hyperlipidemia, unspecified: Secondary | ICD-10-CM

## 2014-12-02 LAB — HEMOGLOBIN A1C: Hgb A1c MFr Bld: 8.5 % — ABNORMAL HIGH (ref 4.6–6.5)

## 2014-12-02 MED ORDER — ATORVASTATIN CALCIUM 40 MG PO TABS: 40.0000 mg | ORAL_TABLET | Freq: Every day | ORAL | Status: DC

## 2014-12-02 NOTE — Progress Notes (Signed)
OFFICE NOTE  12/02/2014  CC:  Chief Complaint  Patient presents with  . Follow-up   HPI: Patient is a 63 y.o. Caucasian male who is here for 4 mo f/u DM 2, HTN, hyperlipidemia. Checks glucose at home about 1 time per month, last time was "148".   No home bp checks. He feels well.   Pertinent PMH:  Past medical, surgical, social, and family history reviewed and no changes are noted since last office visit.  MEDS:  Outpatient Prescriptions Prior to Visit  Medication Sig Dispense Refill  . cetirizine (ZYRTEC) 10 MG tablet Take 10 mg by mouth daily.     . Glucose Blood (FREESTYLE LITE TEST VI)     . hydrochlorothiazide (HYDRODIURIL) 25 MG tablet TAKE 1 TABLET BY MOUTH DAILY 90 tablet 0  . Liraglutide (VICTOZA) 18 MG/3ML SOPN 1.8 mg SQ qd 15 mL 11  . metFORMIN (GLUCOPHAGE) 1000 MG tablet TAKE 1 TABLET BY MOUTH TWICE DAILY 60 tablet 3  . pantoprazole (PROTONIX) 40 MG tablet Take 1 tablet (40 mg total) by mouth daily. 60 tablet 5  . Insulin Pen Needle 32G X 6 MM MISC Use as directed with Victoza 100 each 1  . atorvastatin (LIPITOR) 40 MG tablet Take 1 tablet (40 mg total) by mouth daily. (Patient not taking: Reported on 12/02/2014) 30 tablet 11  . atovaquone-proguanil (MALARONE) 250-100 MG TABS 1 tab po qd starting 2 days prior to leaving for [place] and continue for 7d after returning from [place] (Patient not taking: Reported on 12/02/2014) 18 tablet 0  . ciprofloxacin (CIPRO) 750 MG tablet Take 1 tablet (750 mg total) by mouth 2 (two) times daily. (Patient not taking: Reported on 12/02/2014) 20 tablet 0  . HYDROcodone-acetaminophen (NORCO/VICODIN) 5-325 MG per tablet Take 1-2 tablets by mouth every 6 (six) hours as needed for moderate pain. (Patient not taking: Reported on 12/02/2014) 30 tablet 0  . metFORMIN (GLUCOPHAGE) 1000 MG tablet TAKE 1 TABLET BY MOUTH TWICE DAILY (Patient not taking: Reported on 12/02/2014) 60 tablet 2  . metroNIDAZOLE (FLAGYL) 500 MG tablet Take 1 tablet (500 mg  total) by mouth 3 (three) times daily. (Patient not taking: Reported on 12/02/2014) 30 tablet 0  . phentermine 37.5 MG capsule Take 37.5 mg by mouth daily.      . typhoid (VIVOTIF) DR capsule 1 cap po qod x 4 doses.  Last dose should be finished 1 week prior to leaving for [place]. (Patient not taking: Reported on 12/02/2014) 4 capsule 0   No facility-administered medications prior to visit.    PE: Blood pressure 134/93, pulse 73, temperature 97.3 F (36.3 C), temperature source Temporal, height 5\' 10"  (1.778 m), weight 214 lb (97.07 kg), SpO2 95 %. Gen: Alert, well appearing.  Patient is oriented to person, place, time, and situation. AFFECT: pleasant, lucid thought and speech. No further exam today.  LAB: none today RECENT: Lab Results  Component Value Date   HGBA1C 8.6* 08/11/2014   Lab Results  Component Value Date   WBC 5.7 08/11/2014   HGB 14.4 08/11/2014   HCT 42.8 08/11/2014   MCV 91.0 08/11/2014   PLT 221.0 08/11/2014     Chemistry      Component Value Date/Time   NA 135 08/11/2014 1346   K 4.2 08/11/2014 1346   CL 97 08/11/2014 1346   CO2 29 08/11/2014 1346   BUN 15 08/11/2014 1346   CREATININE 1.0 08/11/2014 1346      Component Value Date/Time   CALCIUM 9.5  08/11/2014 1346   ALKPHOS 52 08/11/2014 1346   AST 26 08/11/2014 1346   ALT 41 08/11/2014 1346   BILITOT 0.8 08/11/2014 1346     Lab Results  Component Value Date   TSH 1.18 08/11/2014   Lab Results  Component Value Date   CHOL 299* 08/11/2014   HDL 42.90 08/11/2014   LDLCALC 99 11/26/2012   LDLDIRECT 178.6 08/11/2014   TRIG 430.0* 08/11/2014   CHOLHDL 7 08/11/2014     IMPRESSION AND PLAN:  1) DM 2; hx of poor control. Cont current meds, recheck HbA1c today.  2) Hyperlipidemia; inadvertently sent his new statin to wrong pharmacy.  I sent it to correct pharmacy today and he'll take this (atorv 40mg  qhs) and we'll recheck FLP at next f/u.  3) HTN; The current medical regimen is effective;   continue present plan and medications.  An After Visit Summary was printed and given to the patient.  FOLLOW UP: 4 mo

## 2014-12-02 NOTE — Progress Notes (Signed)
Pre visit review using our clinic review tool, if applicable. No additional management support is needed unless otherwise documented below in the visit note. 

## 2014-12-02 NOTE — Addendum Note (Signed)
Addended by: Tammi Sou on: 12/02/2014 08:47 AM   Modules accepted: Orders

## 2014-12-03 ENCOUNTER — Telehealth: Payer: Self-pay | Admitting: Family Medicine

## 2014-12-03 NOTE — Telephone Encounter (Signed)
emmi mailed  °

## 2014-12-05 ENCOUNTER — Other Ambulatory Visit: Payer: Self-pay | Admitting: Family Medicine

## 2014-12-08 ENCOUNTER — Telehealth: Payer: Self-pay

## 2014-12-08 NOTE — Telephone Encounter (Signed)
Pls call in generic actos 30 mg, 1 tab po qd, #30, RF x 12. Tell pt to start off taking 1/2 tab a day for the first 30 days that he is on the med, then go up to the whole tab once a day. Doesn't matter what time of day he takes it and he can take it w/out regard to a meal/eating. Continue current dosing of victoza and metformin.-thx

## 2014-12-08 NOTE — Telephone Encounter (Signed)
Pt called back. I let him know his lab results. He is taking the [place] with his diabetes meds. Please advise on medication changes.

## 2014-12-09 NOTE — Telephone Encounter (Signed)
LMOM to CB. 

## 2014-12-10 MED ORDER — PIOGLITAZONE HCL 30 MG PO TABS: 30.0000 mg | ORAL_TABLET | Freq: Every day | ORAL | Status: DC

## 2014-12-10 NOTE — Telephone Encounter (Signed)
Patient aware of new medication and dosing instructions.

## 2014-12-17 ENCOUNTER — Ambulatory Visit (INDEPENDENT_AMBULATORY_CARE_PROVIDER_SITE_OTHER): Payer: BC Managed Care – PPO | Admitting: Family Medicine

## 2014-12-17 ENCOUNTER — Encounter: Payer: Self-pay | Admitting: Family Medicine

## 2014-12-17 VITALS — BP 128/81 | HR 91 | Temp 97.6°F | Resp 18 | Ht 70.0 in | Wt 213.0 lb

## 2014-12-17 DIAGNOSIS — J069 Acute upper respiratory infection, unspecified: Secondary | ICD-10-CM

## 2014-12-17 MED ORDER — HYDROCODONE-HOMATROPINE 5-1.5 MG/5ML PO SYRP: ORAL_SOLUTION | ORAL | Status: DC

## 2014-12-17 MED ORDER — BENZONATATE 200 MG PO CAPS: 200.0000 mg | ORAL_CAPSULE | Freq: Three times a day (TID) | ORAL | Status: DC | PRN

## 2014-12-17 MED ORDER — AZITHROMYCIN 250 MG PO TABS: ORAL_TABLET | ORAL | Status: DC

## 2014-12-17 NOTE — Progress Notes (Signed)
OFFICE NOTE  12/17/2014  CC:  Chief Complaint  Patient presents with  . Cough    since Christmas  . chest congestion   HPI: Patient is a 63 y.o. Caucasian male who is here for respiratory symptoms.   Onset about 5 days ago with ST, cough.  Lots of phlegm coming up.  Subjective fever 1st day but gone now. No HA.  Feels PND in throat.  No SOB, wheezing, or chest tightness.   Tried mucinex, some cough med from [place], some rockin' rye--nothing helping.  Pertinent PMH:  Past medical, surgical, social, and family history reviewed and no changes are noted since last office visit.  MEDS:  Outpatient Prescriptions Prior to Visit  Medication Sig Dispense Refill  . cetirizine (ZYRTEC) 10 MG tablet Take 10 mg by mouth daily.     . Glucose Blood (FREESTYLE LITE TEST VI)     . Liraglutide (VICTOZA) 18 MG/3ML SOPN 1.8 mg SQ qd 15 mL 11  . metFORMIN (GLUCOPHAGE) 1000 MG tablet TAKE 1 TABLET BY MOUTH TWICE DAILY 60 tablet 3  . pantoprazole (PROTONIX) 40 MG tablet Take 1 tablet (40 mg total) by mouth daily. 60 tablet 5  . pioglitazone (ACTOS) 30 MG tablet Take 1 tablet (30 mg total) by mouth daily. 30 tablet 12  . atorvastatin (LIPITOR) 40 MG tablet Take 1 tablet (40 mg total) by mouth daily. 90 tablet 1  . hydrochlorothiazide (HYDRODIURIL) 25 MG tablet TAKE 1 TABLET BY MOUTH DAILY 90 tablet 0  . metFORMIN (GLUCOPHAGE) 1000 MG tablet TAKE 1 TABLET BY MOUTH TWICE DAILY 60 tablet 2   No facility-administered medications prior to visit.    PE: Blood pressure 128/81, pulse 91, temperature 97.6 F (36.4 C), temperature source Temporal, resp. rate 18, height 5\' 10"  (1.778 m), weight 213 lb (96.616 kg), SpO2 97 %. VS: noted--normal. Gen: alert, NAD, NONTOXIC APPEARING. HEENT: eyes without injection, drainage, or swelling.  Ears: EACs clear, TMs with normal light reflex and landmarks.  Nose: Clear rhinorrhea, with some dried, crusty exudate adherent to mildly injected mucosa.  No purulent d/c.  No  paranasal sinus TTP.  No facial swelling.  Throat and mouth without focal lesion.  No pharyngial swelling, erythema, or exudate.   Neck: supple, no LAD.   LUNGS: CTA bilat, nonlabored resps.   CV: RRR, no m/r/g. EXT: no c/c/e SKIN: no rash  IMPRESSION AND PLAN:  Viral URI. Symptomatic care discussed: Tessalon perles rx'd at pt's request: 200mg  tid prn.  I also rx'd hycodan syrup 1-2 tsp qhs prn cough, #157ml. May use OTC nasal saline spray or irrigation solution bid.  Azithromycin rx to hold: pt has instructions to fill in 5d if not improved any or if getting worse in next 5d. Signs/symptoms to call or return for were reviewed and pt expressed understanding.  An After Visit Summary was printed and given to the patient.  FOLLOW UP: prn

## 2014-12-17 NOTE — Progress Notes (Signed)
Pre visit review using our clinic review tool, if applicable. No additional management support is needed unless otherwise documented below in the visit note. 

## 2015-01-01 ENCOUNTER — Telehealth: Payer: Self-pay | Admitting: Family Medicine

## 2015-01-01 MED ORDER — EXENATIDE 5 MCG/0.02ML ~~LOC~~ SOPN: 5.0000 ug | PEN_INJECTOR | Freq: Two times a day (BID) | SUBCUTANEOUS | Status: DC

## 2015-01-01 NOTE — Telephone Encounter (Signed)
Insurance no longer covers his victoza. Have to switch to similar injection med called Byetta, but it is a TWICE a day injection. Tell him it needs to be given with a meal and the injections need to be at least 6 hours apart. For the first month he has to be on the "starter" dose.  Tell him to call our office when he begins the last week of this starter dose and I'll send in rx for the "maintenance" dose. Have him call if any probs with the med.-thx

## 2015-01-01 NOTE — Telephone Encounter (Signed)
Pt states that the letter he got said they will approve two pens per month.  He said he has plenty of victoza at this time.  Apparently we have the Rx giving him 4 pens per month instead of two.  Please advise.

## 2015-01-03 NOTE — Telephone Encounter (Signed)
By my calculations he would need 5 victoza pens per month if he is taking 1.8mg  injection of this med daily. If he only gets 2 pens per month then he is not going to be able to take a high enough dose of victoza to help him, so I recommend he switch to Byetta injections.

## 2015-01-05 NOTE — Telephone Encounter (Signed)
OK. Byetta is called in already so he can just start this when he runs out of victoza.

## 2015-01-05 NOTE — Telephone Encounter (Signed)
Patient stated that he would be fine with changing to byetta. Patient stated that he has 5-6 pens of victoza now and that he is using 1.6 mg daily.

## 2015-01-07 NOTE — Telephone Encounter (Signed)
Patient notified

## 2015-02-11 ENCOUNTER — Other Ambulatory Visit: Payer: Self-pay | Admitting: Family Medicine

## 2015-04-17 ENCOUNTER — Other Ambulatory Visit: Payer: Self-pay | Admitting: Family Medicine

## 2015-06-16 ENCOUNTER — Other Ambulatory Visit: Payer: Self-pay | Admitting: Family Medicine

## 2015-06-16 NOTE — Telephone Encounter (Signed)
RF request for hctz LOV: 12/02/14 Next ov: 06/29/15 Last written: 07/01/14 #90 w/ 0RF.  Pt needs to keep apt on 06/29/15.

## 2015-06-16 NOTE — Telephone Encounter (Signed)
RF request for Metformin.  LOV: 12/02/14 Next ov: 06/29/15 Last written: 04/20/15 #60 w/ 2RF

## 2015-06-29 ENCOUNTER — Ambulatory Visit (INDEPENDENT_AMBULATORY_CARE_PROVIDER_SITE_OTHER): Payer: 59 | Admitting: Family Medicine

## 2015-06-29 ENCOUNTER — Other Ambulatory Visit: Payer: Self-pay | Admitting: Family Medicine

## 2015-06-29 ENCOUNTER — Encounter: Payer: Self-pay | Admitting: Family Medicine

## 2015-06-29 VITALS — BP 140/99 | HR 73 | Temp 98.3°F | Resp 16 | Ht 70.0 in | Wt 214.0 lb

## 2015-06-29 DIAGNOSIS — E785 Hyperlipidemia, unspecified: Secondary | ICD-10-CM

## 2015-06-29 DIAGNOSIS — Z86010 Personal history of colonic polyps: Secondary | ICD-10-CM | POA: Diagnosis not present

## 2015-06-29 DIAGNOSIS — E119 Type 2 diabetes mellitus without complications: Secondary | ICD-10-CM

## 2015-06-29 DIAGNOSIS — K219 Gastro-esophageal reflux disease without esophagitis: Secondary | ICD-10-CM

## 2015-06-29 LAB — HEMOGLOBIN A1C: HEMOGLOBIN A1C: 7.3 % — AB (ref 4.6–6.5)

## 2015-06-29 LAB — LIPID PANEL
Cholesterol: 265 mg/dL — ABNORMAL HIGH (ref 0–200)
HDL: 38.8 mg/dL — ABNORMAL LOW (ref >39.00)
NonHDL: 226.2
TRIGLYCERIDES: 320 mg/dL — AB (ref 0.0–149.0)
Total CHOL/HDL Ratio: 7
VLDL: 64 mg/dL — AB (ref 0.0–40.0)

## 2015-06-29 LAB — COMPREHENSIVE METABOLIC PANEL
ALT: 22 U/L (ref 0–53)
AST: 17 U/L (ref 0–37)
Albumin: 4.2 g/dL (ref 3.5–5.2)
Alkaline Phosphatase: 41 U/L (ref 39–117)
BUN: 22 mg/dL (ref 6–23)
CALCIUM: 9.2 mg/dL (ref 8.4–10.5)
CO2: 28 mEq/L (ref 19–32)
Chloride: 102 mEq/L (ref 96–112)
Creatinine, Ser: 0.99 mg/dL (ref 0.40–1.50)
GFR: 80.9 mL/min (ref >60.00)
GLUCOSE: 164 mg/dL — AB (ref 70–99)
POTASSIUM: 4.1 meq/L (ref 3.5–5.1)
Sodium: 137 mEq/L (ref 135–145)
Total Bilirubin: 0.5 mg/dL (ref 0.2–1.2)
Total Protein: 6.8 g/dL (ref 6.0–8.3)

## 2015-06-29 LAB — LDL CHOLESTEROL, DIRECT: LDL DIRECT: 135 mg/dL

## 2015-06-29 MED ORDER — LIRAGLUTIDE 18 MG/3ML ~~LOC~~ SOPN: PEN_INJECTOR | SUBCUTANEOUS | Status: DC

## 2015-06-29 MED ORDER — HYDROCHLOROTHIAZIDE 25 MG PO TABS: 25.0000 mg | ORAL_TABLET | Freq: Every day | ORAL | Status: DC

## 2015-06-29 MED ORDER — ATORVASTATIN CALCIUM 40 MG PO TABS: 40.0000 mg | ORAL_TABLET | Freq: Every day | ORAL | Status: DC

## 2015-06-29 MED ORDER — PANTOPRAZOLE SODIUM 40 MG PO TBEC: 40.0000 mg | DELAYED_RELEASE_TABLET | Freq: Every day | ORAL | Status: DC

## 2015-06-29 MED ORDER — METFORMIN HCL 1000 MG PO TABS: 1000.0000 mg | ORAL_TABLET | Freq: Two times a day (BID) | ORAL | Status: DC

## 2015-06-29 NOTE — Progress Notes (Signed)
OFFICE VISIT  06/29/2015   CC:  Chief Complaint  Patient presents with  . Follow-up    Pt is fasting.   HPI:    Patient is a 64 y.o. Caucasian male who presents for 7 mo f/u DM 2, hyperlipidemia, GERD. Feeling good.   Working hard, no exercise.  Diet: "try not to eat much bread". No home gluc or bp monitoring. Taking metformin and hctz and victoza 0.6 mg sQ qd. He is a mass of confusion in trying to figure out what he is ACTUALLY taking and why.  Says he is taking pantoprazole most days and it helps for his GERD.  Says he got eye exam in the last couple of months.  Past Medical History  Diagnosis Date  . Diabetes mellitus   . Hyperlipidemia     Crestor caused leg cramps  . History of hemorrhoids   . Diverticulosis of colon     hospitalized for diverticulitis in remote past; also had episode 08/27/14  . Adenomatous colon polyp 04/2006    Dr. Lajoyce Corners  . Shoulder pain, right      Injection on 2 consec mo at [facility] (Dr. Marlou Sa) 2012  . History of ETT     LOW RISK/NEG ETT 02/2011 w/Dr. Doreatha Lew  . Nephrolithiasis     Saw urologist in [place] past ([facility])  . Renal cyst, left     hemorrhagic vs proteinacious (last f/u CT 2007 showed stability)--Dr. Lajoyce Corners.  . Fatty liver 2015    Noted on noncontrast CT done for eval for kidney stones  . Hypertension     Past Surgical History  Procedure Laterality Date  . Hemorrhoid surgery    . Chalazion excision  summer 2015  . Colonoscopy w/ polypectomy  04/2006    Adenomatous polyps x 2; recall 5 yrs (Dr. Lajoyce Corners)  . Esophagogastroduodenoscopy  2008    Esophagitis/gastritis/GERD.  H pylori and Barrett's NEG.    Outpatient Prescriptions Prior to Visit  Medication Sig Dispense Refill  . cetirizine (ZYRTEC) 10 MG tablet Take 10 mg by mouth daily.     . Glucose Blood (FREESTYLE LITE TEST VI)     . pantoprazole (PROTONIX) 40 MG tablet Take 1 tablet (40 mg total) by mouth daily. 60 tablet 5  . hydrochlorothiazide (HYDRODIURIL) 25 MG tablet  TAKE 1 TABLET BY MOUTH DAILY 90 tablet 0  . metFORMIN (GLUCOPHAGE) 1000 MG tablet TAKE 1 TABLET BY MOUTH TWICE DAILY 60 tablet 2  . atorvastatin (LIPITOR) 40 MG tablet Take 1 tablet (40 mg total) by mouth daily. 90 tablet 1  . pioglitazone (ACTOS) 30 MG tablet Take 1 tablet (30 mg total) by mouth daily. 30 tablet 12  . azithromycin (ZITHROMAX) 250 MG tablet 2 tabs po qd x 1d, then 1 tab po qd x 4d (Patient not taking: Reported on 06/29/2015) 6 tablet 0  . benzonatate (TESSALON) 200 MG capsule Take 1 capsule (200 mg total) by mouth 3 (three) times daily as needed for cough. (Patient not taking: Reported on 06/29/2015) 30 capsule 0  . BYETTA 5 MCG PEN 5 MCG/0.02ML SOPN injection INJECT 0.02 MLS INTO THE SKIN TWICE DAILY WITH A MEAL 1.2 mL 0  . hydrochlorothiazide (HYDRODIURIL) 25 MG tablet TAKE 1 TABLET BY MOUTH DAILY (Patient not taking: Reported on 06/29/2015) 90 tablet 0  . HYDROcodone-homatropine (HYCODAN) 5-1.5 MG/5ML syrup 1-2 tsp po qhs prn cough (Patient not taking: Reported on 06/29/2015) 120 mL 0  . metFORMIN (GLUCOPHAGE) 1000 MG tablet TAKE 1 TABLET BY MOUTH  TWICE DAILY (Patient not taking: Reported on 06/29/2015) 60 tablet 1   No facility-administered medications prior to visit.    No Known Allergies  ROS As per HPI  PE: Blood pressure 140/99, pulse 73, temperature 98.3 F (36.8 C), temperature source Oral, resp. rate 16, height 5\' 10"  (1.778 m), weight 214 lb (97.07 kg), SpO2 97 %. Gen: Alert, well appearing.  Patient is oriented to person, place, time, and situation. CV: RRR, no m/r/g.   LUNGS: CTA bilat, nonlabored resps, good aeration in all lung fields. EXT: no clubbing, cyanosis, or edema.    LABS:  Lab Results  Component Value Date   HGBA1C 8.5* 12/02/2014     Chemistry      Component Value Date/Time   NA 135 08/11/2014 1346   K 4.2 08/11/2014 1346   CL 97 08/11/2014 1346   CO2 29 08/11/2014 1346   BUN 15 08/11/2014 1346   CREATININE 1.0 08/11/2014 1346       Component Value Date/Time   CALCIUM 9.5 08/11/2014 1346   ALKPHOS 52 08/11/2014 1346   AST 26 08/11/2014 1346   ALT 41 08/11/2014 1346   BILITOT 0.8 08/11/2014 1346     Lab Results  Component Value Date   WBC 5.7 08/11/2014   HGB 14.4 08/11/2014   HCT 42.8 08/11/2014   MCV 91.0 08/11/2014   PLT 221.0 08/11/2014   Lab Results  Component Value Date   CHOL 299* 08/11/2014   HDL 42.90 08/11/2014   LDLCALC 99 11/26/2012   LDLDIRECT 178.6 08/11/2014   TRIG 430.0* 08/11/2014   CHOLHDL 7 08/11/2014   Lab Results  Component Value Date   TSH 1.18 08/11/2014     IMPRESSION AND PLAN:  1) DM 2, hx of poor control last a1c.  No home monitoring. ? Compliant with meds? Check A1c today.  2) HTN; The current medical regimen is effective;  continue present plan and medications. BMET today.  His bp was up today and he rushed out before we could recheck it today. 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  3) GERD: The current medical regimen is effective;  continue present plan and medications.  4) Hx of adenomatous colon polyps: recommended he call his GI (Dr. Lajoyce Corners) for appt for repeat colonoscopy--he is several years overdue.  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  An After Visit Summary was printed and given to the patient.   FOLLOW UP: Return in about 3 months (around 09/29/2015).

## 2015-06-29 NOTE — Progress Notes (Signed)
Pre visit review using our clinic review tool, if applicable. No additional management support is needed unless otherwise documented below in the visit note. 

## 2015-08-21 ENCOUNTER — Other Ambulatory Visit: Payer: Self-pay | Admitting: Family Medicine

## 2015-09-06 ENCOUNTER — Other Ambulatory Visit: Payer: Self-pay | Admitting: Family Medicine

## 2015-09-30 ENCOUNTER — Encounter: Payer: Self-pay | Admitting: Family Medicine

## 2015-09-30 ENCOUNTER — Ambulatory Visit (INDEPENDENT_AMBULATORY_CARE_PROVIDER_SITE_OTHER): Payer: 59 | Admitting: Family Medicine

## 2015-09-30 VITALS — BP 118/84 | HR 75 | Temp 98.5°F | Resp 16 | Ht 70.0 in | Wt 211.0 lb

## 2015-09-30 DIAGNOSIS — E119 Type 2 diabetes mellitus without complications: Secondary | ICD-10-CM | POA: Diagnosis not present

## 2015-09-30 DIAGNOSIS — Z23 Encounter for immunization: Secondary | ICD-10-CM | POA: Diagnosis not present

## 2015-09-30 LAB — LIPID PANEL
Cholesterol: 189 mg/dL (ref 0–200)
HDL: 39 mg/dL — ABNORMAL LOW (ref >39.00)
NonHDL: 149.9
TRIGLYCERIDES: 240 mg/dL — AB (ref 0.0–149.0)
Total CHOL/HDL Ratio: 5
VLDL: 48 mg/dL — ABNORMAL HIGH (ref 0.0–40.0)

## 2015-09-30 LAB — MICROALBUMIN / CREATININE URINE RATIO
Creatinine,U: 188.3 mg/dL
MICROALB/CREAT RATIO: 1 mg/g (ref 0.0–30.0)
Microalb, Ur: 1.9 mg/dL (ref 0.0–1.9)

## 2015-09-30 LAB — LDL CHOLESTEROL, DIRECT: LDL DIRECT: 107 mg/dL

## 2015-09-30 LAB — HEMOGLOBIN A1C: Hgb A1c MFr Bld: 8.1 % — ABNORMAL HIGH (ref 4.6–6.5)

## 2015-09-30 NOTE — Progress Notes (Signed)
OFFICE VISIT  09/30/2015   CC:  Chief Complaint  Patient presents with  . Follow-up    DM and HTN. Pt is fasting.    HPI:    Patient is a 64 y.o. Caucasian male who presents for 3 mo f/u DM2, HTN, HLD. Compliant with meds.  No home monitoring of glucoses or bp. Says he feels great.  Feet: no burning, tingling, or numbness.  No hx of foot ulcer.  Past Medical History  Diagnosis Date  . Diabetes mellitus   . Hyperlipidemia     Crestor caused leg cramps  . History of hemorrhoids   . Diverticulosis of colon     hospitalized for diverticulitis in remote past; also had episode 08/27/14  . Adenomatous colon polyp 04/2006    Dr. Lajoyce Corners  . Shoulder pain, right      Injection on 2 consec mo at [facility] (Dr. Marlou Sa) 2012  . History of ETT     LOW RISK/NEG ETT 02/2011 w/Dr. Doreatha Lew  . Nephrolithiasis     Saw urologist in [place] past (?[facility])  . Renal cyst, left     hemorrhagic vs proteinacious (last f/u CT 2007 showed stability)--Dr. Lajoyce Corners.  . Fatty liver 2015    Noted on noncontrast CT done for eval for kidney stones  . Hypertension     Past Surgical History  Procedure Laterality Date  . Hemorrhoid surgery    . Chalazion excision  summer 2015  . Colonoscopy w/ polypectomy  04/2006    Adenomatous polyps x 2; recall 5 yrs (Dr. Lajoyce Corners)  . Esophagogastroduodenoscopy  2008    Esophagitis/gastritis/GERD.  H pylori and Barrett's NEG.    Outpatient Prescriptions Prior to Visit  Medication Sig Dispense Refill  . atorvastatin (LIPITOR) 40 MG tablet Take 1 tablet (40 mg total) by mouth daily. 30 tablet 6  . cetirizine (ZYRTEC) 10 MG tablet Take 10 mg by mouth daily.     . Glucose Blood (FREESTYLE LITE TEST VI)     . hydrochlorothiazide (HYDRODIURIL) 25 MG tablet Take 1 tablet (25 mg total) by mouth daily. 90 tablet 3  . Liraglutide (VICTOZA) 18 MG/3ML SOPN 0.6mg  SQ q AM 15 mL 6  . metFORMIN (GLUCOPHAGE) 1000 MG tablet Take 1 tablet (1,000 mg total) by mouth 2 (two) times daily. 180  tablet 3  . pantoprazole (PROTONIX) 40 MG tablet Take 1 tablet (40 mg total) by mouth daily. 90 tablet 3  . pioglitazone (ACTOS) 30 MG tablet Take 1 tablet (30 mg total) by mouth daily. 30 tablet 12  . hydrochlorothiazide (HYDRODIURIL) 25 MG tablet TAKE 1 TABLET BY MOUTH DAILY (Patient not taking: Reported on 09/30/2015) 90 tablet 0   No facility-administered medications prior to visit.    No Known Allergies  ROS As per HPI  PE: Blood pressure 118/84, pulse 75, temperature 98.5 F (36.9 C), temperature source Oral, resp. rate 16, height 5\' 10"  (1.778 m), weight 211 lb (95.709 kg), SpO2 95 %.  Gen: Alert, well appearing.  Patient is oriented to person, place, time, and situation. AFFECT: pleasant, lucid thought and speech. Foot exam - bilateral normal; no swelling, tenderness or skin or vascular lesions. Color and temperature is normal. Sensation is intact. Peripheral pulses are palpable. Toenails are normal.  LABS:  Lab Results  Component Value Date   TSH 1.18 08/11/2014   Lab Results  Component Value Date   WBC 5.7 08/11/2014   HGB 14.4 08/11/2014   HCT 42.8 08/11/2014   MCV 91.0 08/11/2014  PLT 221.0 08/11/2014   Lab Results  Component Value Date   CREATININE 0.99 06/29/2015   BUN 22 06/29/2015   NA 137 06/29/2015   K 4.1 06/29/2015   CL 102 06/29/2015   CO2 28 06/29/2015   Lab Results  Component Value Date   ALT 22 06/29/2015   AST 17 06/29/2015   ALKPHOS 41 06/29/2015   BILITOT 0.5 06/29/2015   Lab Results  Component Value Date   CHOL 265* 06/29/2015   Lab Results  Component Value Date   HDL 38.80* 06/29/2015   Lab Results  Component Value Date   LDLCALC 99 11/26/2012   Lab Results  Component Value Date   TRIG 320.0* 06/29/2015   Lab Results  Component Value Date   CHOLHDL 7 06/29/2015   Lab Results  Component Value Date   PSA 0.29 02/08/2011   Lab Results  Component Value Date   HGBA1C 7.3* 06/29/2015    IMPRESSION AND PLAN:  1) DM  2, compliant with all meds except actos, which he had not been taking at last visit either. Check A1c and urine microalb/cr today. Feet exam normal.  2) HTN: The current medical regimen is effective;  continue present plan and medications. Lytes/cr good 3 mo ago.  3) Hyperlipidemia: we'll see what his FLP looks like now that he has been taking his med for 3 mo again.  4) Hx of adenomatous colon polyps: reminded pt again to contact his GI MD, Dr. Lajoyce Corners, to arrange f/u colonoscopy--gave pt contact information for Dr. Lajoyce Corners.  Flu vaccine given today.  FOLLOW UP: Return in about 4 months (around 01/31/2016) for annual CPE (fasting).

## 2015-09-30 NOTE — Progress Notes (Signed)
Pre visit review using our clinic review tool, if applicable. No additional management support is needed unless otherwise documented below in the visit note. 

## 2015-12-20 DIAGNOSIS — N182 Chronic kidney disease, stage 2 (mild): Secondary | ICD-10-CM

## 2015-12-20 HISTORY — DX: Chronic kidney disease, stage 2 (mild): N18.2

## 2016-01-17 ENCOUNTER — Other Ambulatory Visit: Payer: Self-pay | Admitting: Family Medicine

## 2016-01-28 ENCOUNTER — Encounter: Payer: 59 | Admitting: Family Medicine

## 2016-02-09 ENCOUNTER — Encounter (HOSPITAL_BASED_OUTPATIENT_CLINIC_OR_DEPARTMENT_OTHER): Payer: Self-pay

## 2016-02-09 ENCOUNTER — Emergency Department (HOSPITAL_BASED_OUTPATIENT_CLINIC_OR_DEPARTMENT_OTHER)
Admission: EM | Admit: 2016-02-09 | Discharge: 2016-02-09 | Disposition: A | Discharge status: Home / Self Care | Payer: BLUE CROSS/BLUE SHIELD | Attending: Emergency Medicine | Admitting: Emergency Medicine

## 2016-02-09 ENCOUNTER — Emergency Department (HOSPITAL_BASED_OUTPATIENT_CLINIC_OR_DEPARTMENT_OTHER): Payer: BLUE CROSS/BLUE SHIELD

## 2016-02-09 DIAGNOSIS — Z8739 Personal history of other diseases of the musculoskeletal system and connective tissue: Secondary | ICD-10-CM | POA: Diagnosis not present

## 2016-02-09 DIAGNOSIS — E785 Hyperlipidemia, unspecified: Secondary | ICD-10-CM | POA: Insufficient documentation

## 2016-02-09 DIAGNOSIS — Z7984 Long term (current) use of oral hypoglycemic drugs: Secondary | ICD-10-CM | POA: Diagnosis not present

## 2016-02-09 DIAGNOSIS — N200 Calculus of kidney: Secondary | ICD-10-CM | POA: Diagnosis not present

## 2016-02-09 DIAGNOSIS — Z86010 Personal history of colonic polyps: Secondary | ICD-10-CM | POA: Diagnosis not present

## 2016-02-09 DIAGNOSIS — I10 Essential (primary) hypertension: Secondary | ICD-10-CM | POA: Insufficient documentation

## 2016-02-09 DIAGNOSIS — Q6100 Congenital renal cyst, unspecified: Secondary | ICD-10-CM | POA: Diagnosis not present

## 2016-02-09 DIAGNOSIS — Z8719 Personal history of other diseases of the digestive system: Secondary | ICD-10-CM | POA: Insufficient documentation

## 2016-02-09 DIAGNOSIS — Z79899 Other long term (current) drug therapy: Secondary | ICD-10-CM | POA: Diagnosis not present

## 2016-02-09 DIAGNOSIS — E119 Type 2 diabetes mellitus without complications: Secondary | ICD-10-CM | POA: Insufficient documentation

## 2016-02-09 DIAGNOSIS — R1032 Left lower quadrant pain: Secondary | ICD-10-CM | POA: Diagnosis present

## 2016-02-09 LAB — BASIC METABOLIC PANEL
Anion gap: 9 (ref 5–15)
BUN: 21 mg/dL — AB (ref 6–20)
CHLORIDE: 99 mmol/L — AB (ref 101–111)
CO2: 29 mmol/L (ref 22–32)
Calcium: 8.9 mg/dL (ref 8.9–10.3)
Creatinine, Ser: 1.28 mg/dL — ABNORMAL HIGH (ref 0.61–1.24)
GFR calc Af Amer: >60 mL/min (ref >60)
GFR calc non Af Amer: 58 mL/min — ABNORMAL LOW (ref >60)
GLUCOSE: 237 mg/dL — AB (ref 65–99)
POTASSIUM: 3.9 mmol/L (ref 3.5–5.1)
Sodium: 137 mmol/L (ref 135–145)

## 2016-02-09 LAB — URINE MICROSCOPIC-ADD ON: WBC UA: NONE SEEN WBC/hpf (ref 0–5)

## 2016-02-09 LAB — CBC WITH DIFFERENTIAL/PLATELET
Basophils Absolute: 0 10*3/uL (ref 0.0–0.1)
Basophils Relative: 0 %
Eosinophils Absolute: 0.4 10*3/uL (ref 0.0–0.7)
Eosinophils Relative: 6 %
HCT: 42.2 % (ref 39.0–52.0)
HEMOGLOBIN: 14.2 g/dL (ref 13.0–17.0)
LYMPHS ABS: 1.8 10*3/uL (ref 0.7–4.0)
LYMPHS PCT: 28 %
MCH: 30.2 pg (ref 26.0–34.0)
MCHC: 33.6 g/dL (ref 30.0–36.0)
MCV: 89.8 fL (ref 78.0–100.0)
Monocytes Absolute: 0.5 10*3/uL (ref 0.1–1.0)
Monocytes Relative: 8 %
NEUTROS PCT: 58 %
Neutro Abs: 3.6 10*3/uL (ref 1.7–7.7)
Platelets: 180 10*3/uL (ref 150–400)
RBC: 4.7 MIL/uL (ref 4.22–5.81)
RDW: 12.3 % (ref 11.5–15.5)
WBC: 6.3 10*3/uL (ref 4.0–10.5)

## 2016-02-09 LAB — URINALYSIS, ROUTINE W REFLEX MICROSCOPIC
Bilirubin Urine: NEGATIVE
GLUCOSE, UA: 500 mg/dL — AB
Ketones, ur: 15 mg/dL — AB
Leukocytes, UA: NEGATIVE
Nitrite: NEGATIVE
Protein, ur: NEGATIVE mg/dL
pH: 5.5 (ref 5.0–8.0)

## 2016-02-09 LAB — CBG MONITORING, ED: GLUCOSE-CAPILLARY: 234 mg/dL — AB (ref 65–99)

## 2016-02-09 MED ORDER — IOHEXOL 300 MG/ML  SOLN
25.0000 mL | Freq: Once | INTRAMUSCULAR | Status: AC | PRN
  Administered 2016-02-09: 25 mL via ORAL

## 2016-02-09 MED ORDER — TAMSULOSIN HCL 0.4 MG PO CAPS: 0.4000 mg | ORAL_CAPSULE | Freq: Every day | ORAL | Status: DC

## 2016-02-09 MED ORDER — KETOROLAC TROMETHAMINE 30 MG/ML IJ SOLN
30.0000 mg | Freq: Once | INTRAMUSCULAR | Status: AC
  Administered 2016-02-09: 30 mg via INTRAVENOUS
  Filled 2016-02-09: qty 1

## 2016-02-09 MED ORDER — HYDROMORPHONE HCL 1 MG/ML IJ SOLN
1.0000 mg | Freq: Once | INTRAMUSCULAR | Status: AC
  Administered 2016-02-09: 1 mg via INTRAVENOUS
  Filled 2016-02-09: qty 1

## 2016-02-09 MED ORDER — ONDANSETRON HCL 4 MG/2ML IJ SOLN
4.0000 mg | Freq: Once | INTRAMUSCULAR | Status: AC
Start: 2016-02-09 — End: 2016-02-09
  Administered 2016-02-09: 4 mg via INTRAVENOUS
  Filled 2016-02-09: qty 2

## 2016-02-09 MED ORDER — OXYCODONE-ACETAMINOPHEN 5-325 MG PO TABS: 1.0000 | ORAL_TABLET | ORAL | Status: DC | PRN

## 2016-02-09 MED ORDER — IOHEXOL 300 MG/ML  SOLN
100.0000 mL | Freq: Once | INTRAMUSCULAR | Status: AC | PRN
  Administered 2016-02-09: 100 mL via INTRAVENOUS

## 2016-02-09 MED ORDER — SODIUM CHLORIDE 0.9 % IV SOLN
INTRAVENOUS | Status: DC
  Administered 2016-02-09: 07:00:00 via INTRAVENOUS

## 2016-02-09 MED FILL — TAMSULOSIN HCL 0.4 MG CAP: 0.4 | 10 days supply | Qty: 10 | Fill #0

## 2016-02-09 MED FILL — OXYCODONE/APAP 5-325: 5-325 | 2 days supply | Qty: 20 | Fill #0

## 2016-02-09 NOTE — ED Provider Notes (Signed)
Results for orders placed or performed during the hospital encounter of 02/09/16  CBC with Differential/Platelet  Result Value Ref Range   WBC 6.3 4.0 - 10.5 K/uL   RBC 4.70 4.22 - 5.81 MIL/uL   Hemoglobin 14.2 13.0 - 17.0 g/dL   HCT 42.2 39.0 - 52.0 %   MCV 89.8 78.0 - 100.0 fL   MCH 30.2 26.0 - 34.0 pg   MCHC 33.6 30.0 - 36.0 g/dL   RDW 12.3 11.5 - 15.5 %   Platelets 180 150 - 400 K/uL   Neutrophils Relative % 58 %   Neutro Abs 3.6 1.7 - 7.7 K/uL   Lymphocytes Relative 28 %   Lymphs Abs 1.8 0.7 - 4.0 K/uL   Monocytes Relative 8 %   Monocytes Absolute 0.5 0.1 - 1.0 K/uL   Eosinophils Relative 6 %   Eosinophils Absolute 0.4 0.0 - 0.7 K/uL   Basophils Relative 0 %   Basophils Absolute 0.0 0.0 - 0.1 K/uL  Basic metabolic panel  Result Value Ref Range   Sodium 137 135 - 145 mmol/L   Potassium 3.9 3.5 - 5.1 mmol/L   Chloride 99 (L) 101 - 111 mmol/L   CO2 29 22 - 32 mmol/L   Glucose, Bld 237 (H) 65 - 99 mg/dL   BUN 21 (H) 6 - 20 mg/dL   Creatinine, Ser 1.28 (H) 0.61 - 1.24 mg/dL   Calcium 8.9 8.9 - 10.3 mg/dL   GFR calc non Af Amer 58 (L) >60 mL/min   GFR calc Af Amer >60 >60 mL/min   Anion gap 9 5 - 15  Urinalysis, Routine w reflex microscopic (not at [facility])  Result Value Ref Range   Color, Urine YELLOW YELLOW   APPearance CLEAR CLEAR   Specific Gravity, Urine >1.046 (H) 1.005 - 1.030   pH 5.5 5.0 - 8.0   Glucose, UA 500 (A) NEGATIVE mg/dL   Hgb urine dipstick LARGE (A) NEGATIVE   Bilirubin Urine NEGATIVE NEGATIVE   Ketones, ur 15 (A) NEGATIVE mg/dL   Protein, ur NEGATIVE NEGATIVE mg/dL   Nitrite NEGATIVE NEGATIVE   Leukocytes, UA NEGATIVE NEGATIVE  Urine microscopic-add on  Result Value Ref Range   Squamous Epithelial / LPF 0-5 (A) NONE SEEN   WBC, UA NONE SEEN 0 - 5 WBC/hpf   RBC / HPF 6-30 0 - 5 RBC/hpf   Bacteria, UA RARE (A) NONE SEEN   Crystals URIC ACID CRYSTALS (A) NEGATIVE  CBG monitoring, ED  Result Value Ref Range   Glucose-Capillary 234 (H) 65 - 99  mg/dL   Ct Abdomen Pelvis W Contrast  02/09/2016  CLINICAL DATA:  Left lower quadrant pain since this morning at 4 a.m. EXAM: CT ABDOMEN AND PELVIS WITH CONTRAST TECHNIQUE: Multidetector CT imaging of the abdomen and pelvis was performed using the standard protocol following bolus administration of intravenous contrast. CONTRAST:  68mL OMNIPAQUE IOHEXOL 300 MG/ML SOLN, 171mL OMNIPAQUE IOHEXOL 300 MG/ML SOLN COMPARISON:  08/27/2014 FINDINGS: Lung bases are clear.  No effusions.  Heart is normal size. Mild diffuse fatty infiltration of the liver. Gallbladder, spleen, pancreas, adrenals and right kidney are unremarkable. There is extensive perinephric stranding on the left. Small mid left ureteral stone measuring 2-3 mm. Slight fullness of the left renal collecting system and proximal ureter. No additional ureteral stones. Urinary bladder is decompressed and grossly unremarkable. Descending colonic and sigmoid diverticulosis. No active diverticulitis. Stomach and small bowel are unremarkable. Calcifications throughout the aorta and iliac vessels. No aneurysm. No free  fluid, free air or adenopathy. No acute bony abnormality or focal bone lesion. Bilateral L5 pars defects noted with slight anterolisthesis. IMPRESSION: 2-3 mm mid left ureteral stone with slight fullness of the collecting system and proximal ureter, but extensive perinephric stranding. Mild fatty liver. Electronically Signed   By: Rolm Baptise M.D.   On: 02/09/2016 07:36    Care was taken over from Dr. Florina Ou. Patient had onset of left dominant pain during the night. He was awaiting CT scan. CT scan shows evidence of a left mid ureteral stone. Patient's pain is controlled in the ED. He has no evidence of a urinary tract infection. His creatinine is mildly elevated but I suspect it's due to his underlying medical conditions rather than the kidney stone given his pain just started within the last 12 hours. I did have a long discussion with the patient  and his wife that it's important to follow-up with a urologist to ensure passage of the stone. I did notify them that the creatinine was elevated as compared to his last lab values and this needs to be followed up by his PCP. He was given a prescription for Percocet and Flomax. Return precautions were given.  Malvin Johns, MD 02/09/16 [phone]

## 2016-02-09 NOTE — Discharge Instructions (Signed)
Kidney Stones °Kidney stones (urolithiasis) are deposits that form inside your kidneys. The intense pain is caused by the stone moving through the urinary tract. When the stone moves, the ureter goes into spasm around the stone. The stone is usually passed in the urine.  °CAUSES  °· A disorder that makes certain neck glands produce too much parathyroid hormone (primary hyperparathyroidism). °· A buildup of uric acid crystals, similar to gout in your joints. °· Narrowing (stricture) of the ureter. °· A kidney obstruction present at birth (congenital obstruction). °· Previous surgery on the kidney or ureters. °· Numerous kidney infections. °SYMPTOMS  °· Feeling sick to your stomach (nauseous). °· Throwing up (vomiting). °· Blood in the urine (hematuria). °· Pain that usually spreads (radiates) to the groin. °· Frequency or urgency of urination. °DIAGNOSIS  °· Taking a history and physical exam. °· Blood or urine tests. °· CT scan. °· Occasionally, an examination of the inside of the urinary bladder (cystoscopy) is performed. °TREATMENT  °· Observation. °· Increasing your fluid intake. °· Extracorporeal shock wave lithotripsy--This is a noninvasive procedure that uses shock waves to break up kidney stones. °· Surgery may be needed if you have severe pain or persistent obstruction. There are various surgical procedures. Most of the procedures are performed with the use of small instruments. Only small incisions are needed to accommodate these instruments, so recovery time is minimized. °The size, location, and chemical composition are all important variables that will determine the proper choice of action for you. Talk to your health care provider to better understand your situation so that you will minimize the risk of injury to yourself and your kidney.  °HOME CARE INSTRUCTIONS  °· Drink enough water and fluids to keep your urine clear or pale yellow. This will help you to pass the stone or stone fragments. °· Strain  all urine through the provided strainer. Keep all particulate matter and stones for your health care provider to see. The stone causing the pain may be as small as a grain of salt. It is very important to use the strainer each and every time you pass your urine. The collection of your stone will allow your health care provider to analyze it and verify that a stone has actually passed. The stone analysis will often identify what you can do to reduce the incidence of recurrences. °· Only take over-the-counter or prescription medicines for pain, discomfort, or fever as directed by your health care provider. °· Keep all follow-up visits as told by your health care provider. This is important. °· Get follow-up X-rays if required. The absence of pain does not always mean that the stone has passed. It may have only stopped moving. If the urine remains completely obstructed, it can cause loss of kidney function or even complete destruction of the kidney. It is your responsibility to make sure X-rays and follow-ups are completed. Ultrasounds of the kidney can show blockages and the status of the kidney. Ultrasounds are not associated with any radiation and can be performed easily in a matter of minutes. °· Make changes to your daily diet as told by your health care provider. You may be told to: °¨ Limit the amount of salt that you eat. °¨ Eat 5 or more servings of fruits and vegetables each day. °¨ Limit the amount of meat, poultry, fish, and eggs that you eat. °· Collect a 24-hour urine sample as told by your health care provider. You may need to collect another urine sample every 6-12   months. °SEEK MEDICAL CARE IF: °· You experience pain that is progressive and unresponsive to any pain medicine you have been prescribed. °SEEK IMMEDIATE MEDICAL CARE IF:  °· Pain cannot be controlled with the prescribed medicine. °· You have a fever or shaking chills. °· The severity or intensity of pain increases over 18 hours and is not  relieved by pain medicine. °· You develop a new onset of abdominal pain. °· You feel faint or pass out. °· You are unable to urinate. °  °This information is not intended to replace advice given to you by your health care provider. Make sure you discuss any questions you have with your health care provider. °  °Document Released: 12/05/2005 Document Revised: 08/26/2015 Document Reviewed: 05/08/2013 °Elsevier Interactive Patient Education ©2016 [employer] ° °

## 2016-02-09 NOTE — ED Notes (Signed)
Pt c/o LLQ pain since 4a, no vomiting; states feels like he needs to has a BM, had 2 but relief; states had 2 hydrocodone an hour ago with no relief

## 2016-02-09 NOTE — ED Provider Notes (Signed)
CSN: [id number]     Arrival date & time 02/09/16  [id number] History   First MD Initiated Contact with Patient 02/09/16 0631     Chief Complaint  Patient presents with  . Abdominal Pain     (Consider location/radiation/quality/duration/timing/severity/associated sxs/prior Treatment) HPI  This is a 65 year old male with a history of diverticulitis and nephrolithiasis. He is here with left lower quadrant abdominal pain that awakened him from sleep about 4 AM. He states the pain is dull and he feels like he has to have a bowel movement. He rates it as a 10 out of 10. He has had 2 bowel movements without relief. His wife gave him 2 hydrocodone tablets without relief. Pain is worse with movement or palpation. There is no associated fever, chills, nausea, vomiting, diarrhea, constipation or urinary symptoms.  Past Medical History  Diagnosis Date  . Diabetes mellitus   . Hyperlipidemia     Crestor caused leg cramps  . History of hemorrhoids   . Diverticulosis of colon     hospitalized for diverticulitis in remote past; also had episode 08/27/14  . Adenomatous colon polyp 04/2006    Dr. Lajoyce Corners  . Shoulder pain, right      Injection on 2 consec mo at [facility] (Dr. Marlou Sa) 2012  . History of ETT     LOW RISK/NEG ETT 02/2011 w/Dr. Doreatha Lew  . Nephrolithiasis     Saw urologist in [place] past ([facility])  . Renal cyst, left     hemorrhagic vs proteinacious (last f/u CT 2007 showed stability)--Dr. Lajoyce Corners.  . Fatty liver 2015    Noted on noncontrast CT done for eval for kidney stones  . Hypertension    Past Surgical History  Procedure Laterality Date  . Hemorrhoid surgery    . Chalazion excision  summer 2015  . Colonoscopy w/ polypectomy  04/2006    Adenomatous polyps x 2; recall 5 yrs (Dr. Lajoyce Corners)  . Esophagogastroduodenoscopy  2008    Esophagitis/gastritis/GERD.  H pylori and Barrett's NEG.   Family History  Problem Relation Age of Onset  . Diabetes Mother   . Hypertension Mother   . Coronary  artery disease Mother   . Diabetes Father   . Hypertension Father   . Coronary artery disease Father   . Lymphoma Father     non-hodgkin  . Obesity Daughter   . Cancer Paternal Grandmother     unknown   Social History  Substance Use Topics  . Smoking status: Never Smoker   . Smokeless tobacco: Never Used  . Alcohol Use: No    Review of Systems  All other systems reviewed and are negative.   Allergies  Review of patient's allergies indicates no known allergies.  Home Medications   Prior to Admission medications   Medication Sig Start Date End Date Taking? Authorizing Provider  atorvastatin (LIPITOR) 40 MG tablet Take 1 tablet (40 mg total) by mouth daily. 06/29/15   Tammi Sou, MD  cetirizine (ZYRTEC) 10 MG tablet Take 10 mg by mouth daily.     Historical Provider, MD  Glucose Blood (FREESTYLE LITE TEST VI)  06/22/12   Historical Provider, MD  hydrochlorothiazide (HYDRODIURIL) 25 MG tablet Take 1 tablet (25 mg total) by mouth daily. 06/29/15   Tammi Sou, MD  Liraglutide (VICTOZA) 18 MG/3ML SOPN 0.6mg  SQ q AM 06/29/15   Tammi Sou, MD  metFORMIN (GLUCOPHAGE) 1000 MG tablet Take 1 tablet (1,000 mg total) by mouth 2 (two) times daily. 06/29/15  Tammi Sou, MD  metFORMIN (GLUCOPHAGE) 1000 MG tablet TAKE 1 TABLET BY MOUTH TWICE DAILY 01/18/16   Tammi Sou, MD  oxyCODONE-acetaminophen (PERCOCET) 5-325 MG tablet Take 1-2 tablets by mouth every 4 (four) hours as needed. 02/09/16   Malvin Johns, MD  pantoprazole (PROTONIX) 40 MG tablet Take 1 tablet (40 mg total) by mouth daily. 06/29/15 06/28/16  Tammi Sou, MD  pioglitazone (ACTOS) 30 MG tablet Take 1 tablet (30 mg total) by mouth daily. 12/10/14   Tammi Sou, MD  tamsulosin (FLOMAX) 0.4 MG CAPS capsule Take 1 capsule (0.4 mg total) by mouth daily. 02/09/16   Malvin Johns, MD   BP 122/71 mmHg  Pulse 81  Temp(Src) 97.9 F (36.6 C) (Oral)  Resp 16  Wt 211 lb (95.709 kg)  SpO2 94%   Physical  Exam  General: Well-developed, well-nourished male in no acute distress; appearance consistent with age of record HENT: normocephalic; atraumatic Eyes: pupils equal, round and reactive to light; extraocular muscles intact Neck: supple Heart: regular rate and rhythm Lungs: clear to auscultation bilaterally Abdomen: soft; nondistended; left lower quadrant tenderness; no masses or hepatosplenomegaly; bowel sounds present GU: No CVA tenderness Extremities: No deformity; full range of motion; pulses normal Neurologic: Awake, alert and oriented; motor function intact in all extremities and symmetric; no facial droop Skin: Warm and dry Psychiatric: Flat affect    ED Course  Procedures (including critical care time)   MDM  Nursing notes and vitals signs, including pulse oximetry, reviewed.  Summary of this visit's results, reviewed by myself:  Labs:  Results for orders placed or performed during the hospital encounter of 02/09/16 (from the past 24 hour(s))  CBC with Differential/Platelet     Status: None   Collection Time: 02/09/16  6:29 AM  Result Value Ref Range   WBC 6.3 4.0 - 10.5 K/uL   RBC 4.70 4.22 - 5.81 MIL/uL   Hemoglobin 14.2 13.0 - 17.0 g/dL   HCT 42.2 39.0 - 52.0 %   MCV 89.8 78.0 - 100.0 fL   MCH 30.2 26.0 - 34.0 pg   MCHC 33.6 30.0 - 36.0 g/dL   RDW 12.3 11.5 - 15.5 %   Platelets 180 150 - 400 K/uL   Neutrophils Relative % 58 %   Neutro Abs 3.6 1.7 - 7.7 K/uL   Lymphocytes Relative 28 %   Lymphs Abs 1.8 0.7 - 4.0 K/uL   Monocytes Relative 8 %   Monocytes Absolute 0.5 0.1 - 1.0 K/uL   Eosinophils Relative 6 %   Eosinophils Absolute 0.4 0.0 - 0.7 K/uL   Basophils Relative 0 %   Basophils Absolute 0.0 0.0 - 0.1 K/uL  Basic metabolic panel     Status: Abnormal   Collection Time: 02/09/16  6:29 AM  Result Value Ref Range   Sodium 137 135 - 145 mmol/L   Potassium 3.9 3.5 - 5.1 mmol/L   Chloride 99 (L) 101 - 111 mmol/L   CO2 29 22 - 32 mmol/L   Glucose, Bld 237  (H) 65 - 99 mg/dL   BUN 21 (H) 6 - 20 mg/dL   Creatinine, Ser 1.28 (H) 0.61 - 1.24 mg/dL   Calcium 8.9 8.9 - 10.3 mg/dL   GFR calc non Af Amer 58 (L) >60 mL/min   GFR calc Af Amer >60 >60 mL/min   Anion gap 9 5 - 15  CBG monitoring, ED     Status: Abnormal   Collection Time: 02/09/16  9:12 AM  Result Value Ref Range   Glucose-Capillary 234 (H) 65 - 99 mg/dL  Urinalysis, Routine w reflex microscopic (not at [facility])     Status: Abnormal   Collection Time: 02/09/16  9:52 AM  Result Value Ref Range   Color, Urine YELLOW YELLOW   APPearance CLEAR CLEAR   Specific Gravity, Urine >1.046 (H) 1.005 - 1.030   pH 5.5 5.0 - 8.0   Glucose, UA 500 (A) NEGATIVE mg/dL   Hgb urine dipstick LARGE (A) NEGATIVE   Bilirubin Urine NEGATIVE NEGATIVE   Ketones, ur 15 (A) NEGATIVE mg/dL   Protein, ur NEGATIVE NEGATIVE mg/dL   Nitrite NEGATIVE NEGATIVE   Leukocytes, UA NEGATIVE NEGATIVE  Urine microscopic-add on     Status: Abnormal   Collection Time: 02/09/16  9:52 AM  Result Value Ref Range   Squamous Epithelial / LPF 0-5 (A) NONE SEEN   WBC, UA NONE SEEN 0 - 5 WBC/hpf   RBC / HPF 6-30 0 - 5 RBC/hpf   Bacteria, UA RARE (A) NONE SEEN   Crystals URIC ACID CRYSTALS (A) NEGATIVE    Imaging Studies: Ct Abdomen Pelvis W Contrast  02/09/2016  CLINICAL DATA:  Left lower quadrant pain since this morning at 4 a.m. EXAM: CT ABDOMEN AND PELVIS WITH CONTRAST TECHNIQUE: Multidetector CT imaging of the abdomen and pelvis was performed using the standard protocol following bolus administration of intravenous contrast. CONTRAST:  6mL OMNIPAQUE IOHEXOL 300 MG/ML SOLN, 162mL OMNIPAQUE IOHEXOL 300 MG/ML SOLN COMPARISON:  08/27/2014 FINDINGS: Lung bases are clear.  No effusions.  Heart is normal size. Mild diffuse fatty infiltration of the liver. Gallbladder, spleen, pancreas, adrenals and right kidney are unremarkable. There is extensive perinephric stranding on the left. Small mid left ureteral stone measuring 2-3 mm.  Slight fullness of the left renal collecting system and proximal ureter. No additional ureteral stones. Urinary bladder is decompressed and grossly unremarkable. Descending colonic and sigmoid diverticulosis. No active diverticulitis. Stomach and small bowel are unremarkable. Calcifications throughout the aorta and iliac vessels. No aneurysm. No free fluid, free air or adenopathy. No acute bony abnormality or focal bone lesion. Bilateral L5 pars defects noted with slight anterolisthesis. IMPRESSION: 2-3 mm mid left ureteral stone with slight fullness of the collecting system and proximal ureter, but extensive perinephric stranding. Mild fatty liver. Electronically Signed   By: Rolm Baptise M.D.   On: 02/09/2016 07:36    6:56 AM Awaiting CT of the abdomen and pelvis. Dr. Tamera Punt to follow up on results and make disposition.   Shanon Rosser, MD 02/09/16 [phone]

## 2016-03-29 ENCOUNTER — Other Ambulatory Visit: Payer: Self-pay | Admitting: *Deleted

## 2016-03-29 ENCOUNTER — Telehealth: Payer: Self-pay | Admitting: Family Medicine

## 2016-03-29 MED ORDER — METFORMIN HCL 1000 MG PO TABS: 1000.0000 mg | ORAL_TABLET | Freq: Two times a day (BID) | ORAL | Status: DC

## 2016-03-29 NOTE — Telephone Encounter (Signed)
RF request for metformin LOV: 09/30/15 Next ov: None Last written: 01/18/16 #60 w/ 0RF

## 2016-03-29 NOTE — Telephone Encounter (Signed)
Pt advised and voiced understanding.  Apt made for 04/05/16 at 9:45am. Pt stated that he will try to keep this apt but due to his work schedule he may have to cancel.

## 2016-03-29 NOTE — Telephone Encounter (Signed)
Pls call pt and ask him to arrange f/u appt (30 min) : reason for visit is d/u DM 2 and discuss recent labs that were done at his [facility] loss clinic.  Needs to be FASTING. --Thx!

## 2016-04-05 ENCOUNTER — Ambulatory Visit: Payer: BLUE CROSS/BLUE SHIELD | Admitting: Family Medicine

## 2016-04-08 ENCOUNTER — Ambulatory Visit: Payer: BLUE CROSS/BLUE SHIELD | Admitting: Family Medicine

## 2016-04-11 ENCOUNTER — Other Ambulatory Visit: Payer: Self-pay | Admitting: Family Medicine

## 2016-04-11 ENCOUNTER — Ambulatory Visit (INDEPENDENT_AMBULATORY_CARE_PROVIDER_SITE_OTHER): Payer: BLUE CROSS/BLUE SHIELD | Admitting: Family Medicine

## 2016-04-11 ENCOUNTER — Encounter: Payer: Self-pay | Admitting: Family Medicine

## 2016-04-11 VITALS — BP 111/80 | HR 72 | Temp 98.6°F | Resp 16 | Ht 70.0 in | Wt 211.0 lb

## 2016-04-11 DIAGNOSIS — E782 Mixed hyperlipidemia: Secondary | ICD-10-CM | POA: Diagnosis not present

## 2016-04-11 DIAGNOSIS — I10 Essential (primary) hypertension: Secondary | ICD-10-CM

## 2016-04-11 DIAGNOSIS — E119 Type 2 diabetes mellitus without complications: Secondary | ICD-10-CM | POA: Diagnosis not present

## 2016-04-11 LAB — COMPREHENSIVE METABOLIC PANEL
ALT: 51 U/L (ref 0–53)
AST: 33 U/L (ref 0–37)
Albumin: 4.5 g/dL (ref 3.5–5.2)
Alkaline Phosphatase: 74 U/L (ref 39–117)
BILIRUBIN TOTAL: 0.7 mg/dL (ref 0.2–1.2)
BUN: 18 mg/dL (ref 6–23)
CO2: 28 meq/L (ref 19–32)
CREATININE: 1.13 mg/dL (ref 0.40–1.50)
Calcium: 9.3 mg/dL (ref 8.4–10.5)
Chloride: 99 mEq/L (ref 96–112)
GFR: 69.28 mL/min (ref >60.00)
GLUCOSE: 179 mg/dL — AB (ref 70–99)
Potassium: 4.3 mEq/L (ref 3.5–5.1)
Sodium: 136 mEq/L (ref 135–145)
TOTAL PROTEIN: 7 g/dL (ref 6.0–8.3)

## 2016-04-11 LAB — LIPID PANEL
CHOLESTEROL: 283 mg/dL — AB (ref 0–200)
HDL: 37.6 mg/dL — ABNORMAL LOW (ref >39.00)
NonHDL: 245.19
Total CHOL/HDL Ratio: 8
Triglycerides: 321 mg/dL — ABNORMAL HIGH (ref 0.0–149.0)
VLDL: 64.2 mg/dL — AB (ref 0.0–40.0)

## 2016-04-11 LAB — LDL CHOLESTEROL, DIRECT: LDL DIRECT: 163 mg/dL

## 2016-04-11 LAB — HEMOGLOBIN A1C: HEMOGLOBIN A1C: 8.7 % — AB (ref 4.6–6.5)

## 2016-04-11 NOTE — Progress Notes (Signed)
OFFICE VISIT  04/11/2016   CC:  Chief Complaint  Shaun Knight presents with  . Follow-up    DM. Pt is fasting.      HPI:    Shaun Knight is a 65 y.o. Caucasian male who presents for 6 mo f/u DM 2, HLD, HTN. I recently received a copy of the labs that his weight loss clinic does on him, dated 03/24/16. These showed glucose 250, Cr 1.15, GGT 606, T chol 315, trigs 577, and otherwise normal chemistries,CBC, and thyroid studies.  He says these labs were done NONFASTING. He is not checking his glucoses.  No blurry vision.   He did not increase his victoza to 1.2 after last visit like we instructed.  Has been out of metformin for 1 week. He takes his statin w/out complaints.  He says he feels great.  ROS: no polyuria, polydipsia, or polyphagia.  No HAs, no dizziness, no CP, no SOB.  No abd pain.  Past Medical History  Diagnosis Date  . Diabetes mellitus   . Hyperlipidemia     Crestor caused leg cramps  . History of hemorrhoids   . Diverticulosis of colon     hospitalized for diverticulitis in remote past; also had episode 08/27/14  . Adenomatous colon polyp 04/2006    Dr. Lajoyce Corners  . Shoulder pain, right      Injection on 2 consec mo at [place] (Dr. Marlou Sa) 2012  . History of ETT     LOW RISK/NEG ETT 02/2011 w/Dr. Doreatha Lew  . Nephrolithiasis     Saw urologist in [place] past (?[facility]). Left mid ureteral stone 01/2016--ED visit.  . Renal cyst, left     hemorrhagic vs proteinacious (last f/u CT 2007 showed stability)--Dr. Lajoyce Corners.  . Fatty liver 2015    Noted on noncontrast CT done for eval for kidney stones  . Hypertension     Past Surgical History  Procedure Laterality Date  . Hemorrhoid surgery    . Chalazion excision  summer 2015  . Colonoscopy w/ polypectomy  04/2006    Adenomatous polyps x 2; recall 5 yrs (Dr. Lajoyce Corners)  . Esophagogastroduodenoscopy  2008    Esophagitis/gastritis/GERD.  H pylori and Barrett's NEG.    Outpatient Prescriptions Prior to Visit  Medication Sig Dispense  Refill  . atorvastatin (LIPITOR) 40 MG tablet Take 1 tablet (40 mg total) by mouth daily. 30 tablet 6  . cetirizine (ZYRTEC) 10 MG tablet Take 10 mg by mouth daily.     . Glucose Blood (FREESTYLE LITE TEST VI)     . hydrochlorothiazide (HYDRODIURIL) 25 MG tablet Take 1 tablet (25 mg total) by mouth daily. 90 tablet 3  . Liraglutide (VICTOZA) 18 MG/3ML SOPN 0.6mg  SQ q AM 15 mL 6  . metFORMIN (GLUCOPHAGE) 1000 MG tablet Take 1 tablet (1,000 mg total) by mouth 2 (two) times daily. 60 tablet 6  . pantoprazole (PROTONIX) 40 MG tablet Take 1 tablet (40 mg total) by mouth daily. 90 tablet 3  . oxyCODONE-acetaminophen (PERCOCET) 5-325 MG tablet Take 1-2 tablets by mouth every 4 (four) hours as needed. (Shaun Knight not taking: Reported on 04/11/2016) 20 tablet 0  . pioglitazone (ACTOS) 30 MG tablet Take 1 tablet (30 mg total) by mouth daily. (Shaun Knight not taking: Reported on 04/11/2016) 30 tablet 12  . tamsulosin (FLOMAX) 0.4 MG CAPS capsule Take 1 capsule (0.4 mg total) by mouth daily. (Shaun Knight not taking: Reported on 04/11/2016) 10 capsule 0   No facility-administered medications prior to visit.    No Known  Allergies  ROS As per HPI  PE: Blood pressure 111/80, pulse 72, temperature 98.6 F (37 C), temperature source Oral, resp. rate 16, height 5\' 10"  (1.778 m), weight 211 lb (95.709 kg), SpO2 96 %. Gen: Alert, well appearing.  Shaun Knight is oriented to person, place, time, and situation. AFFECT: pleasant, lucid thought and speech. No further exam today.  LABS:  None today (see HPI info)  IMPRESSION AND PLAN:  1) DM 2: noncompliant with diet and monitoring.  Only partially compliant with meds. Check HbA1c, RF metformin.  Likely needs additional med but will wait for HbA1c to return.  2) HTN: The current medical regimen is effective;  continue present plan and medications.  3) Hyperlipidemia: last labs at wt loss clinic were done in NON-FASTING state. Repeat FLP and CMET today.  An After Visit  Summary was printed and given to the Shaun Knight.  FOLLOW UP: Return in about 3 months (around 07/11/2016) for annual CPE (fasting).  Signed:  Crissie Sickles, MD           04/11/2016

## 2016-04-11 NOTE — Progress Notes (Signed)
Pre visit review using our clinic review tool, if applicable. No additional management support is needed unless otherwise documented below in the visit note. 

## 2016-04-12 ENCOUNTER — Other Ambulatory Visit: Payer: Self-pay | Admitting: Family Medicine

## 2016-04-12 MED ORDER — CANAGLIFLOZIN 100 MG PO TABS
100.0000 mg | ORAL_TABLET | Freq: Every day | ORAL | Status: DC
Start: 2016-04-12 — End: 2016-10-27

## 2016-04-12 MED ORDER — ATORVASTATIN CALCIUM 40 MG PO TABS: 40.0000 mg | ORAL_TABLET | Freq: Every day | ORAL | Status: DC

## 2016-04-12 MED ORDER — LIRAGLUTIDE 18 MG/3ML ~~LOC~~ SOPN: PEN_INJECTOR | SUBCUTANEOUS | Status: DC

## 2016-04-12 NOTE — Progress Notes (Signed)
OK, will send in rx's.

## 2016-04-25 ENCOUNTER — Encounter: Payer: Self-pay | Admitting: Family Medicine

## 2016-06-30 ENCOUNTER — Other Ambulatory Visit: Payer: Self-pay | Admitting: Family Medicine

## 2016-06-30 NOTE — Telephone Encounter (Signed)
RF request for pantoprazole LOV: 04/11/16 Next ov: 07/11/16 Last written:06/29/15 #90 w/ 3Rf

## 2016-07-04 ENCOUNTER — Other Ambulatory Visit: Payer: Self-pay | Admitting: Family Medicine

## 2016-07-04 ENCOUNTER — Telehealth: Payer: Self-pay | Admitting: Family Medicine

## 2016-07-04 MED ORDER — PEN NEEDLES 32G X 4 MM MISC: 1.0000 | Status: DC

## 2016-07-04 NOTE — Telephone Encounter (Signed)
Rx sent.  Pt advised and voiced understanding.   

## 2016-07-04 NOTE — Telephone Encounter (Signed)
Patient needs needles for his Victoza sent to [facility]

## 2016-07-04 NOTE — Telephone Encounter (Signed)
RF request for hctz LOV: 04/11/16 Next ov: 07/11/16 Last written: 06/29/15 #90 w/ 3Rf  RF request for metformin Last written: 03/29/16 #60 w/ 6RF

## 2016-07-11 ENCOUNTER — Encounter: Payer: Self-pay | Admitting: Family Medicine

## 2016-07-11 ENCOUNTER — Ambulatory Visit (INDEPENDENT_AMBULATORY_CARE_PROVIDER_SITE_OTHER): Payer: Medicare Other | Admitting: Family Medicine

## 2016-07-11 VITALS — BP 119/88 | HR 71 | Temp 98.3°F | Resp 16 | Ht 67.5 in | Wt 203.5 lb

## 2016-07-11 DIAGNOSIS — E118 Type 2 diabetes mellitus with unspecified complications: Secondary | ICD-10-CM

## 2016-07-11 DIAGNOSIS — Z1211 Encounter for screening for malignant neoplasm of colon: Secondary | ICD-10-CM | POA: Diagnosis not present

## 2016-07-11 DIAGNOSIS — Z125 Encounter for screening for malignant neoplasm of prostate: Secondary | ICD-10-CM

## 2016-07-11 DIAGNOSIS — R5383 Other fatigue: Secondary | ICD-10-CM

## 2016-07-11 DIAGNOSIS — Z0000 Encounter for general adult medical examination without abnormal findings: Secondary | ICD-10-CM | POA: Diagnosis not present

## 2016-07-11 DIAGNOSIS — R7989 Other specified abnormal findings of blood chemistry: Secondary | ICD-10-CM

## 2016-07-11 LAB — CBC WITH DIFFERENTIAL/PLATELET
Basophils Absolute: 0 10*3/uL (ref 0.0–0.1)
Basophils Relative: 0.4 % (ref 0.0–3.0)
EOS ABS: 0.2 10*3/uL (ref 0.0–0.7)
EOS PCT: 3 % (ref 0.0–5.0)
HCT: 48 % (ref 39.0–52.0)
HEMOGLOBIN: 16 g/dL (ref 13.0–17.0)
Lymphocytes Relative: 19.8 % (ref 12.0–46.0)
Lymphs Abs: 1.3 10*3/uL (ref 0.7–4.0)
MCHC: 33.3 g/dL (ref 30.0–36.0)
MCV: 91.3 fl (ref 78.0–100.0)
MONO ABS: 0.4 10*3/uL (ref 0.1–1.0)
Monocytes Relative: 6 % (ref 3.0–12.0)
Neutro Abs: 4.7 10*3/uL (ref 1.4–7.7)
Neutrophils Relative %: 70.8 % (ref 43.0–77.0)
Platelets: 196 10*3/uL (ref 150.0–400.0)
RBC: 5.26 Mil/uL (ref 4.22–5.81)
RDW: 13.6 % (ref 11.5–15.5)
WBC: 6.7 10*3/uL (ref 4.0–10.5)

## 2016-07-11 LAB — COMPREHENSIVE METABOLIC PANEL
ALK PHOS: 58 U/L (ref 39–117)
ALT: 27 U/L (ref 0–53)
AST: 20 U/L (ref 0–37)
Albumin: 4.7 g/dL (ref 3.5–5.2)
BUN: 17 mg/dL (ref 6–23)
CHLORIDE: 96 meq/L (ref 96–112)
CO2: 34 mEq/L — ABNORMAL HIGH (ref 19–32)
Calcium: 9.8 mg/dL (ref 8.4–10.5)
Creatinine, Ser: 1.12 mg/dL (ref 0.40–1.50)
GFR: 69.94 mL/min (ref >60.00)
GLUCOSE: 138 mg/dL — AB (ref 70–99)
POTASSIUM: 3.8 meq/L (ref 3.5–5.1)
SODIUM: 139 meq/L (ref 135–145)
TOTAL PROTEIN: 7.3 g/dL (ref 6.0–8.3)
Total Bilirubin: 0.7 mg/dL (ref 0.2–1.2)

## 2016-07-11 LAB — LIPID PANEL
Cholesterol: 204 mg/dL — ABNORMAL HIGH (ref 0–200)
HDL: 43.6 mg/dL (ref >39.00)
NONHDL: 160
TRIGLYCERIDES: 351 mg/dL — AB (ref 0.0–149.0)
Total CHOL/HDL Ratio: 5
VLDL: 70.2 mg/dL — AB (ref 0.0–40.0)

## 2016-07-11 LAB — LDL CHOLESTEROL, DIRECT: Direct LDL: 105 mg/dL

## 2016-07-11 LAB — PSA: PSA: 0.51 ng/mL (ref 0.10–4.00)

## 2016-07-11 LAB — TSH: TSH: 1.11 u[IU]/mL (ref 0.35–4.50)

## 2016-07-11 LAB — HEMOGLOBIN A1C: HEMOGLOBIN A1C: 7.9 % — AB (ref 4.6–6.5)

## 2016-07-11 NOTE — Progress Notes (Signed)
Office Note 07/11/2016  CC:  Chief Complaint  Patient presents with  . Annual Exam    Pt is fasting.     HPI:  Shaun Knight is a 65 y.o. White male who is here for annual health maintenance exam. Started invokana a few months ago.  Also taking a full 1.2 mg of victoza daily. Feels tired at lunchtime daily and it lasts the rest of the day--started after starting invokana. No nausea.   Past Medical History:  Diagnosis Date  . Adenomatous colon polyp 04/2006   Dr. Lajoyce Corners  . Chronic renal insufficiency, stage II (mild) 2017   CrCl 60s  . Diabetes mellitus   . Diverticulosis of colon    hospitalized for diverticulitis in remote past; also had episode 08/27/14  . Fatty liver 2015   Noted on noncontrast CT done for eval for kidney stones  . History of ETT    LOW RISK/NEG ETT 02/2011 w/Dr. Doreatha Lew  . History of hemorrhoids   . Hyperlipidemia    Crestor caused leg cramps  . Hypertension   . Nephrolithiasis    Saw urologist in [place] past (?[facility]). Left mid ureteral stone 01/2016--ED visit.  . Renal cyst, left    hemorrhagic vs proteinacious (last f/u CT 2007 showed stability)--Dr. Lajoyce Corners.  . Shoulder pain, right     Injection on 2 consec mo at [place] (Dr. Marlou Sa) 2012    Past Surgical History:  Procedure Laterality Date  . CHALAZION EXCISION  summer 2015  . COLONOSCOPY W/ POLYPECTOMY  04/2006   Adenomatous polyps x 2; recall 5 yrs (Dr. Lajoyce Corners)  . ESOPHAGOGASTRODUODENOSCOPY  2008   Esophagitis/gastritis/GERD.  H pylori and Barrett's NEG.  Marland Kitchen HEMORRHOID SURGERY      Family History  Problem Relation Age of Onset  . Diabetes Mother   . Hypertension Mother   . Coronary artery disease Mother   . Diabetes Father   . Hypertension Father   . Coronary artery disease Father   . Lymphoma Father     non-hodgkin  . Obesity Daughter   . Cancer Paternal Grandmother     unknown    Social History   Social History  . Marital status: Married    Spouse name: N/A  . Number of  children: N/A  . Years of education: N/A   Occupational History  . Not on file.   Social History Main Topics  . Smoking status: Never Smoker  . Smokeless tobacco: Never Used  . Alcohol use No  . Drug use: No  . Sexual activity: Yes    Partners: Female   Other Topics Concern  . Not on file   Social History Narrative  . No narrative on file    Outpatient Medications Prior to Visit  Medication Sig Dispense Refill  . atorvastatin (LIPITOR) 40 MG tablet Take 1 tablet (40 mg total) by mouth daily. 30 tablet 6  . canagliflozin (INVOKANA) 100 MG TABS tablet Take 1 tablet (100 mg total) by mouth daily before breakfast. 30 tablet 6  . cetirizine (ZYRTEC) 10 MG tablet Take 10 mg by mouth daily.     . Glucose Blood (FREESTYLE LITE TEST VI)     . hydrochlorothiazide (HYDRODIURIL) 25 MG tablet TAKE 1 TABLET(25 MG) BY MOUTH DAILY 90 tablet 3  . Insulin Pen Needle (PEN NEEDLES) 32G X 4 MM MISC 1 each by Does not apply route as directed. 100 each 6  . Liraglutide (VICTOZA) 18 MG/3ML SOPN 1.2 mg SQ q AM 15 mL  6  . metFORMIN (GLUCOPHAGE) 1000 MG tablet Take 1 tablet (1,000 mg total) by mouth 2 (two) times daily. 60 tablet 6  . metFORMIN (GLUCOPHAGE) 1000 MG tablet TAKE 1 TABLET(1000 MG) BY MOUTH TWICE DAILY 180 tablet 1  . pantoprazole (PROTONIX) 40 MG tablet TAKE 1 TABLET(40 MG) BY MOUTH DAILY 90 tablet 3   No facility-administered medications prior to visit.     No Known Allergies  ROS Review of Systems  Constitutional: Positive for fatigue. Negative for appetite change, chills and fever.  HENT: Negative for congestion, dental problem, ear pain and sore throat.   Eyes: Negative for discharge, redness and visual disturbance.  Respiratory: Negative for cough, chest tightness, shortness of breath and wheezing.   Cardiovascular: Negative for chest pain, palpitations and leg swelling.  Gastrointestinal: Negative for abdominal pain, blood in stool, diarrhea, nausea and vomiting.   Genitourinary: Negative for difficulty urinating, dysuria, flank pain, frequency, hematuria and urgency.  Musculoskeletal: Negative for arthralgias, back pain, joint swelling, myalgias and neck stiffness.  Skin: Negative for pallor and rash.  Neurological: Negative for dizziness, speech difficulty, weakness and headaches.  Hematological: Negative for adenopathy. Does not bruise/bleed easily.  Psychiatric/Behavioral: Negative for confusion and sleep disturbance. The patient is not nervous/anxious.     PE; Blood pressure 119/88, pulse 71, temperature 98.3 F (36.8 C), temperature source Oral, resp. rate 16, height 5' 7.5" (1.715 m), weight 203 lb 8 oz (92.3 kg), SpO2 96 %. Gen: Alert, well appearing.  Patient is oriented to person, place, time, and situation. AFFECT: pleasant, lucid thought and speech. ENT: Ears: EACs clear, normal epithelium.  TMs with good light reflex and landmarks bilaterally.  Eyes: no injection, icteris, swelling, or exudate.  EOMI, PERRLA. Nose: no drainage or turbinate edema/swelling.  No injection or focal lesion.  Mouth: lips without lesion/swelling.  Oral mucosa pink and moist.  Dentition intact and without obvious caries or gingival swelling.  Oropharynx without erythema, exudate, or swelling.  Neck: supple/nontender.  No LAD, mass, or TM.  Carotid pulses 2+ bilaterally, without bruits. CV: RRR, no m/r/g.   LUNGS: CTA bilat, nonlabored resps, good aeration in all lung fields. ABD: soft, NT, ND, BS normal.  No hepatospenomegaly or mass.  No bruits. EXT: no clubbing, cyanosis, or edema.  Musculoskeletal: no joint swelling, erythema, warmth, or tenderness.  ROM of all joints intact. Skin - no sores or suspicious lesions or rashes or color changes Rectal exam: negative without mass, lesions or tenderness, PROSTATE EXAM: smooth and symmetric without nodules or tenderness.    Pertinent labs:  Lab Results  Component Value Date   TSH 1.18 08/11/2014   Lab Results   Component Value Date   WBC 6.3 02/09/2016   HGB 14.2 02/09/2016   HCT 42.2 02/09/2016   MCV 89.8 02/09/2016   PLT 180 02/09/2016   Lab Results  Component Value Date   CREATININE 1.13 04/11/2016   BUN 18 04/11/2016   NA 136 04/11/2016   K 4.3 04/11/2016   CL 99 04/11/2016   CO2 28 04/11/2016   Lab Results  Component Value Date   ALT 51 04/11/2016   AST 33 04/11/2016   ALKPHOS 74 04/11/2016   BILITOT 0.7 04/11/2016   Lab Results  Component Value Date   CHOL 283 (H) 04/11/2016   Lab Results  Component Value Date   HDL 37.60 (L) 04/11/2016   Lab Results  Component Value Date   LDLCALC 99 11/26/2012   Lab Results  Component Value Date  TRIG 321.0 (H) 04/11/2016   Lab Results  Component Value Date   CHOLHDL 8 04/11/2016   Lab Results  Component Value Date   PSA 0.29 02/08/2011    Lab Results  Component Value Date   HGBA1C 8.7 (H) 04/11/2016     ASSESSMENT AND PLAN:   Health maintenance exam:  Reviewed age and gender appropriate health maintenance issues (prudent diet, regular exercise, health risks of tobacco and excessive alcohol, use of seatbelts, fire alarms in home, use of sunscreen).  Also reviewed age and gender appropriate health screening as well as vaccine recommendations. Fasting HP labs drawn today, including HbA1c (DM 2). Prostate ca screening: DRE normal today, PSA drawn. Colon ca screening: pt is 5 yrs overdue for repeat colonoscopy (polyps on 2007 TCS by Dr. Lajoyce Corners at [place]). Pt was given [facility]'s contact # so he could arrange repeat colonoscopy.  Regarding the fatigue he attributes to invokana, I told him to stop this med for 1 wk and if he feels back to normal then restart the invokana and see if fatigue returns.  An After Visit Summary was printed and given to the patient.  FOLLOW UP:  Return in about 4 months (around 11/11/2016) for routine chronic illness f/u.  Signed:  Crissie Sickles, MD           07/11/2016

## 2016-07-11 NOTE — Progress Notes (Signed)
Pre visit review using our clinic review tool, if applicable. No additional management support is needed unless otherwise documented below in the visit note. 

## 2016-07-11 NOTE — Patient Instructions (Signed)
Call [facility] and schedule colonoscopy: [phone].

## 2016-10-24 ENCOUNTER — Encounter: Payer: Self-pay | Admitting: Family Medicine

## 2016-10-24 ENCOUNTER — Ambulatory Visit (INDEPENDENT_AMBULATORY_CARE_PROVIDER_SITE_OTHER): Payer: Medicare Other | Admitting: Family Medicine

## 2016-10-24 VITALS — BP 115/84 | HR 66 | Temp 97.8°F | Resp 16 | Wt 204.1 lb

## 2016-10-24 DIAGNOSIS — E119 Type 2 diabetes mellitus without complications: Secondary | ICD-10-CM

## 2016-10-24 DIAGNOSIS — I10 Essential (primary) hypertension: Secondary | ICD-10-CM

## 2016-10-24 DIAGNOSIS — E782 Mixed hyperlipidemia: Secondary | ICD-10-CM

## 2016-10-24 DIAGNOSIS — N182 Chronic kidney disease, stage 2 (mild): Secondary | ICD-10-CM

## 2016-10-24 DIAGNOSIS — Z23 Encounter for immunization: Secondary | ICD-10-CM

## 2016-10-24 LAB — BASIC METABOLIC PANEL
BUN: 16 mg/dL (ref 6–23)
CHLORIDE: 100 meq/L (ref 96–112)
CO2: 33 mEq/L — ABNORMAL HIGH (ref 19–32)
CREATININE: 1.03 mg/dL (ref 0.40–1.50)
Calcium: 9.6 mg/dL (ref 8.4–10.5)
GFR: 76.97 mL/min (ref >60.00)
Glucose, Bld: 149 mg/dL — ABNORMAL HIGH (ref 70–99)
Potassium: 4.4 mEq/L (ref 3.5–5.1)
Sodium: 140 mEq/L (ref 135–145)

## 2016-10-24 LAB — HEMOGLOBIN A1C: HEMOGLOBIN A1C: 7.9 % — AB (ref 4.6–6.5)

## 2016-10-24 NOTE — Addendum Note (Signed)
Addended by: Gordy Councilman on: 10/24/2016 10:33 AM   Modules accepted: Orders

## 2016-10-24 NOTE — Progress Notes (Signed)
OFFICE VISIT  10/24/2016   CC:  Chief Complaint  Patient presents with  . Follow-up    diabetes   HPI:    Patient is a 65 y.o. Caucasian male who presents for 4 mo f/u DM 2, HTN, HLD, CRI stage II (GFR in 60s). No home bp or glucose monitoring. Takes med daily but cut his victoza down to 0.6mg  dosing qd--no clear reason why.  He has hit the donut hole.  Says he feels good. Has been out of town 2 weeks and eating out three times a day. He does not have a formal exercise regimen.  ROS: no HA's, no dizziness, no vision problems, no CP, no SOB, no tingling/burning/numbness in feet.  Past Medical History:  Diagnosis Date  . Adenomatous colon polyp 04/2006   Dr. Lajoyce Corners  . Chronic renal insufficiency, stage II (mild) 2017   CrCl 60s  . Diabetes mellitus   . Diverticulosis of colon    hospitalized for diverticulitis in remote past; also had episode 08/27/14  . Fatty liver 2015   Noted on noncontrast CT done for eval for kidney stones  . History of ETT    LOW RISK/NEG ETT 02/2011 w/Dr. Doreatha Lew  . History of hemorrhoids   . Hyperlipidemia    Crestor caused leg cramps  . Hypertension   . Nephrolithiasis    Saw urologist in [place] past ([facility]). Left mid ureteral stone 01/2016--ED visit.  . Renal cyst, left    hemorrhagic vs proteinacious (last f/u CT 2007 showed stability)--Dr. Lajoyce Corners.  . Shoulder pain, right     Injection on 2 consec mo at [place] (Dr. Marlou Sa) 2012    Past Surgical History:  Procedure Laterality Date  . CHALAZION EXCISION  summer 2015  . COLONOSCOPY W/ POLYPECTOMY  04/2006   Adenomatous polyps x 2; recall 5 yrs (Dr. Lajoyce Corners)  . ESOPHAGOGASTRODUODENOSCOPY  2008   Esophagitis/gastritis/GERD.  H pylori and Barrett's NEG.  Marland Kitchen HEMORRHOID SURGERY      Outpatient Medications Prior to Visit  Medication Sig Dispense Refill  . atorvastatin (LIPITOR) 40 MG tablet Take 1 tablet (40 mg total) by mouth daily. 30 tablet 6  . canagliflozin (INVOKANA) 100 MG TABS tablet Take 1  tablet (100 mg total) by mouth daily before breakfast. 30 tablet 6  . cetirizine (ZYRTEC) 10 MG tablet Take 10 mg by mouth daily.     . Glucose Blood (FREESTYLE LITE TEST VI)     . hydrochlorothiazide (HYDRODIURIL) 25 MG tablet TAKE 1 TABLET(25 MG) BY MOUTH DAILY 90 tablet 3  . Insulin Pen Needle (PEN NEEDLES) 32G X 4 MM MISC 1 each by Does not apply route as directed. 100 each 6  . Liraglutide (VICTOZA) 18 MG/3ML SOPN 1.2 mg SQ q AM 15 mL 6  . metFORMIN (GLUCOPHAGE) 1000 MG tablet TAKE 1 TABLET(1000 MG) BY MOUTH TWICE DAILY 180 tablet 1  . pantoprazole (PROTONIX) 40 MG tablet TAKE 1 TABLET(40 MG) BY MOUTH DAILY 90 tablet 3  . metFORMIN (GLUCOPHAGE) 1000 MG tablet Take 1 tablet (1,000 mg total) by mouth 2 (two) times daily. (Patient not taking: Reported on 10/24/2016) 60 tablet 6   No facility-administered medications prior to visit.     No Known Allergies  ROS As per HPI  PE: Blood pressure 115/84, pulse 66, temperature 97.8 F (36.6 C), temperature source Temporal, resp. rate 16, weight 204 lb 1.9 oz (92.6 kg), SpO2 98 %. Gen: Alert, well appearing.  Patient is oriented to person, place, time, and situation.  AFFECT: pleasant, lucid thought and speech. No further exam today.  LABS:  Lab Results  Component Value Date   TSH 1.11 07/11/2016   Lab Results  Component Value Date   WBC 6.7 07/11/2016   HGB 16.0 07/11/2016   HCT 48.0 07/11/2016   MCV 91.3 07/11/2016   PLT 196.0 07/11/2016   Lab Results  Component Value Date   CREATININE 1.12 07/11/2016   BUN 17 07/11/2016   NA 139 07/11/2016   K 3.8 07/11/2016   CL 96 07/11/2016   CO2 34 (H) 07/11/2016   Lab Results  Component Value Date   ALT 27 07/11/2016   AST 20 07/11/2016   ALKPHOS 58 07/11/2016   BILITOT 0.7 07/11/2016   Lab Results  Component Value Date   CHOL 204 (H) 07/11/2016   Lab Results  Component Value Date   HDL 43.60 07/11/2016   Lab Results  Component Value Date   LDLCALC 99 11/26/2012   Lab  Results  Component Value Date   TRIG 351.0 (H) 07/11/2016   Lab Results  Component Value Date   CHOLHDL 5 07/11/2016   Lab Results  Component Value Date   PSA 0.51 07/11/2016   PSA 0.29 02/08/2011   Lab Results  Component Value Date   HGBA1C 7.9 (H) 07/11/2016    IMPRESSION AND PLAN:  1) DM 2, control not at goal. Noncompliant with diet and home monitoring. Recheck HbA1c today.  May have to get him back up to 1.2 mg victoza dosing.  2) HTN: The current medical regimen is effective;  continue present plan and medications. Check lytes/cr today.  3) CRI stage II (GFR 60s): recheck lytes/cr today.  4) Hyperlipidemia: tolerating statin.  He is not interested in increase in statin dose at this time but we'll consider this in future.  Flu vaccine today.  FOLLOW UP: Return in about 4 months (around 02/21/2017) for routine chronic illness f/u.  Signed:  Crissie Sickles, MD           10/24/2016

## 2016-10-27 ENCOUNTER — Other Ambulatory Visit: Payer: Self-pay | Admitting: Family Medicine

## 2016-12-02 ENCOUNTER — Other Ambulatory Visit: Payer: Self-pay | Admitting: Family Medicine

## 2016-12-02 NOTE — Telephone Encounter (Signed)
RF request for atorvastatin LOV: 10/24/16 Next ov: 02/21/17 Last written: 04/12/16 #30 w/ 6RF

## 2016-12-30 ENCOUNTER — Other Ambulatory Visit: Payer: Self-pay | Admitting: Family Medicine

## 2017-01-31 ENCOUNTER — Encounter: Payer: Self-pay | Admitting: Family Medicine

## 2017-01-31 ENCOUNTER — Ambulatory Visit (INDEPENDENT_AMBULATORY_CARE_PROVIDER_SITE_OTHER): Payer: Medicare Other | Admitting: Family Medicine

## 2017-01-31 VITALS — BP 139/86 | HR 77 | Temp 98.2°F | Resp 20 | Wt 212.5 lb

## 2017-01-31 DIAGNOSIS — B9789 Other viral agents as the cause of diseases classified elsewhere: Secondary | ICD-10-CM | POA: Diagnosis not present

## 2017-01-31 DIAGNOSIS — J329 Chronic sinusitis, unspecified: Secondary | ICD-10-CM | POA: Diagnosis not present

## 2017-01-31 MED ORDER — AMOXICILLIN-POT CLAVULANATE 875-125 MG PO TABS: 1.0000 | ORAL_TABLET | Freq: Two times a day (BID) | ORAL | 0 refills | Status: DC

## 2017-01-31 MED ORDER — METHYLPREDNISOLONE ACETATE 80 MG/ML IJ SUSP
80.0000 mg | Freq: Once | INTRAMUSCULAR | Status: AC
  Administered 2017-01-31: 80 mg via INTRAMUSCULAR

## 2017-01-31 MED ORDER — BENZONATATE 200 MG PO CAPS: 200.0000 mg | ORAL_CAPSULE | Freq: Two times a day (BID) | ORAL | 0 refills | Status: DC | PRN

## 2017-01-31 NOTE — Patient Instructions (Signed)
Rest, hydrate.  Steroid injection today.  Continue mucinex DM. Tessalon perles called in for cough. OTC therapies.  If not improving in 1-2 days, then start antibiotic.    Sinusitis, Adult Sinusitis is soreness and inflammation of your sinuses. Sinuses are hollow spaces in the bones around your face. They are located:  Around your eyes.  In the middle of your forehead.  Behind your nose.  In your cheekbones. Your sinuses and nasal passages are lined with a stringy fluid (mucus). Mucus normally drains out of your sinuses. When your nasal tissues get inflamed or swollen, the mucus can get trapped or blocked so air cannot flow through your sinuses. This lets bacteria, viruses, and funguses grow, and that leads to infection. Follow these instructions at home: Medicines  Take, use, or apply over-the-counter and prescription medicines only as told by your doctor. These may include nasal sprays.  If you were prescribed an antibiotic medicine, take it as told by your doctor. Do not stop taking the antibiotic even if you start to feel better. Hydrate and Humidify  Drink enough water to keep your pee (urine) clear or pale yellow.  Use a cool mist humidifier to keep the humidity level in your home above 50%.  Breathe in steam for 10-15 minutes, 3-4 times a day or as told by your doctor. You can do this in the bathroom while a hot shower is running.  Try not to spend time in cool or dry air. Rest  Rest as much as possible.  Sleep with your head raised (elevated).  Make sure to get enough sleep each night. General instructions  Put a warm, moist washcloth on your face 3-4 times a day or as told by your doctor. This will help with discomfort.  Wash your hands often with soap and water. If there is no soap and water, use hand sanitizer.  Do not smoke. Avoid being around people who are smoking (secondhand smoke).  Keep all follow-up visits as told by your doctor. This is  important. Contact a doctor if:  You have a fever.  Your symptoms get worse.  Your symptoms do not get better within 10 days. Get help right away if:  You have a very bad headache.  You cannot stop throwing up (vomiting).  You have pain or swelling around your face or eyes.  You have trouble seeing.  You feel confused.  Your neck is stiff.  You have trouble breathing. This information is not intended to replace advice given to you by your health care provider. Make sure you discuss any questions you have with your health care provider. Document Released: 05/23/2008 Document Revised: 07/31/2016 Document Reviewed: 09/30/2015 Elsevier Interactive Patient Education  2017 [employer].

## 2017-01-31 NOTE — Progress Notes (Signed)
Shaun Knight , February 17, 1951, 66 y.o., male MRN: [id number] Patient Care Team    Relationship Specialty Notifications Start End  Tammi Sou, MD PCP - General Family Medicine  05/27/11     CC: cough Subjective: Pt presents for an acute OV with complaints of dry cough 4 days duration.  Associated symptoms include cough, congestion rhinorrhea, headache.  He had a virus like URI abut 2 weeks ago in which he experienced some swelling of his uvula.  Pt denies fever, chills, nausea, vomit, diarrhea. . Pt has tried mucinex DM to ease their symptoms.  No sick contacts  No Known Allergies Social History  Substance Use Topics  . Smoking status: Never Smoker  . Smokeless tobacco: Never Used  . Alcohol use No   Past Medical History:  Diagnosis Date  . Adenomatous colon polyp 04/2006   Dr. Lajoyce Corners  . Chronic renal insufficiency, stage II (mild) 2017   CrCl 60s  . Diabetes mellitus   . Diverticulosis of colon    hospitalized for diverticulitis in remote past; also had episode 08/27/14  . Fatty liver 2015   Noted on noncontrast CT done for eval for kidney stones  . History of ETT    LOW RISK/NEG ETT 02/2011 w/Dr. Doreatha Lew  . History of hemorrhoids   . Hyperlipidemia    Crestor caused leg cramps  . Hypertension   . Nephrolithiasis    Saw urologist in [place] past ([facility]). Left mid ureteral stone 01/2016--ED visit.  . Renal cyst, left    hemorrhagic vs proteinacious (last f/u CT 2007 showed stability)--Dr. Lajoyce Corners.  . Shoulder pain, right     Injection on 2 consec mo at [place] (Dr. Marlou Sa) 2012   Past Surgical History:  Procedure Laterality Date  . CHALAZION EXCISION  summer 2015  . COLONOSCOPY W/ POLYPECTOMY  04/2006   Adenomatous polyps x 2; recall 5 yrs (Dr. Lajoyce Corners)  . ESOPHAGOGASTRODUODENOSCOPY  2008   Esophagitis/gastritis/GERD.  H pylori and Barrett's NEG.  Marland Kitchen HEMORRHOID SURGERY     Family History  Problem Relation Age of Onset  . Diabetes Mother   . Hypertension Mother   .  Coronary artery disease Mother   . Diabetes Father   . Hypertension Father   . Coronary artery disease Father   . Lymphoma Father     non-hodgkin  . Obesity Daughter   . Cancer Paternal Grandmother     unknown   Allergies as of 01/31/2017   No Known Allergies     Medication List       Accurate as of 01/31/17  2:11 PM. Always use your most recent med list.          atorvastatin 40 MG tablet Commonly known as:  LIPITOR TAKE 1 TABLET BY MOUTH EVERY DAY   cetirizine 10 MG tablet Commonly known as:  ZYRTEC Take 10 mg by mouth daily.   FREESTYLE LITE TEST VI   hydrochlorothiazide 25 MG tablet Commonly known as:  HYDRODIURIL TAKE 1 TABLET(25 MG) BY MOUTH DAILY   INVOKANA 100 MG Tabs tablet Generic drug:  canagliflozin TAKE 1 TABLET BY MOUTH EVERY DAY BEFORE BREAKFAST   liraglutide 18 MG/3ML Sopn Commonly known as:  VICTOZA 1.2 mg SQ q AM   metFORMIN 1000 MG tablet Commonly known as:  GLUCOPHAGE TAKE 1 TABLET BY MOUTH TWICE DAILY   pantoprazole 40 MG tablet Commonly known as:  PROTONIX TAKE 1 TABLET(40 MG) BY MOUTH DAILY   Pen Needles 32G X 4 MM Misc  1 each by Does not apply route as directed.       No results found for this or any previous visit (from the past 24 hour(s)). No results found.   ROS: Negative, with the exception of above mentioned in HPI   Objective:  BP 139/86 (BP Location: Right Arm, Patient Position: Sitting, Cuff Size: Large)   Pulse 77   Temp 98.2 F (36.8 C)   Resp 20   Wt 212 lb 8 oz (96.4 kg)   SpO2 98%   BMI 32.79 kg/m  Body mass index is 32.79 kg/m. Gen: Afebrile. No acute distress. Nontoxic in appearance, well developed, well nourished.  HENT: AT. . Bilateral TM visualized wnl. MMM, no oral lesions. Bilateral nares with erythema, drainage. Throat without erythema or exudates. Cough present. No hoarseness or sinus pressure.  Eyes:Pupils Equal Round Reactive to light, Extraocular movements intact,  Conjunctiva without  redness, discharge or icterus. Neck/lymp/endocrine: Supple,no lymphadenopathy CV: RRR  Chest: CTAB, no wheeze or crackles. Good air movement, normal resp effort.  Abd: Soft. NTND. BS presnet.  Skin: no rashes, purpura or petechiae.  Neuro: Normal gait. PERLA. EOMi. Alert. Oriented x3   Assessment/Plan: Shaun Knight is a 66 y.o. male present for acute OV for  Viral sinusitis/cough - rest, hydrate. Continue mucinex DM.  - Tessalon perles and IM depo medrol provided today.  - If no improvement or worsening in 24-48 hr, he is to fill Augmentin script provided today and start. - F/U PRN     electronically signed by:  Howard Pouch, DO  [place]

## 2017-02-21 ENCOUNTER — Encounter: Payer: Self-pay | Admitting: Family Medicine

## 2017-02-21 ENCOUNTER — Ambulatory Visit (INDEPENDENT_AMBULATORY_CARE_PROVIDER_SITE_OTHER): Payer: Medicare Other | Admitting: Family Medicine

## 2017-02-21 VITALS — BP 122/84 | HR 72 | Temp 98.3°F | Resp 16 | Ht 67.5 in | Wt 202.5 lb

## 2017-02-21 DIAGNOSIS — E7800 Pure hypercholesterolemia, unspecified: Secondary | ICD-10-CM | POA: Diagnosis not present

## 2017-02-21 DIAGNOSIS — Z1211 Encounter for screening for malignant neoplasm of colon: Secondary | ICD-10-CM

## 2017-02-21 DIAGNOSIS — N182 Chronic kidney disease, stage 2 (mild): Secondary | ICD-10-CM

## 2017-02-21 DIAGNOSIS — I10 Essential (primary) hypertension: Secondary | ICD-10-CM

## 2017-02-21 DIAGNOSIS — Z86010 Personal history of colonic polyps: Secondary | ICD-10-CM

## 2017-02-21 DIAGNOSIS — E118 Type 2 diabetes mellitus with unspecified complications: Secondary | ICD-10-CM | POA: Diagnosis not present

## 2017-02-21 LAB — BASIC METABOLIC PANEL
BUN: 23 mg/dL (ref 6–23)
CO2: 32 mEq/L (ref 19–32)
Calcium: 9.5 mg/dL (ref 8.4–10.5)
Chloride: 98 mEq/L (ref 96–112)
Creatinine, Ser: 1.09 mg/dL (ref 0.40–1.50)
GFR: 72.03 mL/min (ref >60.00)
GLUCOSE: 231 mg/dL — AB (ref 70–99)
POTASSIUM: 4 meq/L (ref 3.5–5.1)
SODIUM: 138 meq/L (ref 135–145)

## 2017-02-21 LAB — LIPID PANEL
Cholesterol: 243 mg/dL — ABNORMAL HIGH (ref 0–200)
HDL: 39.2 mg/dL (ref >39.00)
NonHDL: 203.77
Total CHOL/HDL Ratio: 6
Triglycerides: 356 mg/dL — ABNORMAL HIGH (ref 0.0–149.0)
VLDL: 71.2 mg/dL — AB (ref 0.0–40.0)

## 2017-02-21 LAB — MICROALBUMIN / CREATININE URINE RATIO
Creatinine,U: 63.2 mg/dL
Microalb Creat Ratio: 4.4 mg/g (ref 0.0–30.0)
Microalb, Ur: 2.8 mg/dL — ABNORMAL HIGH (ref 0.0–1.9)

## 2017-02-21 LAB — HEMOGLOBIN A1C: HEMOGLOBIN A1C: 10 % — AB (ref 4.6–6.5)

## 2017-02-21 LAB — LDL CHOLESTEROL, DIRECT: Direct LDL: 130 mg/dL

## 2017-02-21 MED ORDER — PANTOPRAZOLE SODIUM 40 MG PO TBEC: DELAYED_RELEASE_TABLET | ORAL | 3 refills | Status: DC

## 2017-02-21 MED ORDER — HYDROCHLOROTHIAZIDE 25 MG PO TABS: ORAL_TABLET | ORAL | 3 refills | Status: DC

## 2017-02-21 MED ORDER — ATORVASTATIN CALCIUM 40 MG PO TABS: 40.0000 mg | ORAL_TABLET | Freq: Every day | ORAL | 3 refills | Status: DC

## 2017-02-21 MED ORDER — LIRAGLUTIDE 18 MG/3ML ~~LOC~~ SOPN: PEN_INJECTOR | SUBCUTANEOUS | 3 refills | Status: DC

## 2017-02-21 MED ORDER — CANAGLIFLOZIN 100 MG PO TABS: ORAL_TABLET | ORAL | 3 refills | Status: DC

## 2017-02-21 NOTE — Progress Notes (Signed)
Pre visit review using our clinic review tool, if applicable. No additional management support is needed unless otherwise documented below in the visit note. 

## 2017-02-21 NOTE — Progress Notes (Signed)
OFFICE VISIT  02/21/2017   CC:  Chief Complaint  Patient presents with  . Follow-up    [facility], pt is fasitng.    HPI:    Patient is a 66 y.o. Caucasian male who presents for 4 mo f/u DM 2, HTN, HLD, CRI stage II (GFR 60s). He has continued victoza 0.6mg  qd instead of the 1.2 mg dose that we instructed him to take after his last a1c.    DM 2: not checking glucoses.  He does says he urinates a lot.  No blurry vision. Feet: no burning, tingling, or numbness. Compliant with invokana and metformin.  HTN: no home bp monitoring.  Compliant with HCTZ.  HLD takes atorva 40mg  qd, w/out side effect.  Recent resp illness still resolving. No fevers, SOB, or wheezing.  ROS: no CP, no dizziness, no HAs, no DOE/SOB, no myalgias, no palpitations.  Past Medical History:  Diagnosis Date  . Adenomatous colon polyp 04/2006   Dr. Lajoyce Corners  . Chronic renal insufficiency, stage II (mild) 2017   CrCl 60s  . Diabetes mellitus   . Diverticulosis of colon    hospitalized for diverticulitis in remote past; also had episode 08/27/14  . Fatty liver 2015   Noted on noncontrast CT done for eval for kidney stones  . History of ETT    LOW RISK/NEG ETT 02/2011 w/Dr. Doreatha Lew  . History of hemorrhoids   . Hyperlipidemia    Crestor caused leg cramps  . Hypertension   . Nephrolithiasis    Saw urologist in [place] past ([facility]). Left mid ureteral stone 01/2016--ED visit.  . Renal cyst, left    hemorrhagic vs proteinacious (last f/u CT 2007 showed stability)--Dr. Lajoyce Corners.  . Shoulder pain, right     Injection on 2 consec mo at [place] (Dr. Marlou Sa) 2012    Past Surgical History:  Procedure Laterality Date  . CHALAZION EXCISION  summer 2015  . COLONOSCOPY W/ POLYPECTOMY  04/2006   Adenomatous polyps x 2; recall 5 yrs (Dr. Lajoyce Corners)  . ESOPHAGOGASTRODUODENOSCOPY  2008   Esophagitis/gastritis/GERD.  H pylori and Barrett's NEG.  Marland Kitchen HEMORRHOID SURGERY      Outpatient Medications Prior to Visit  Medication Sig Dispense  Refill  . atorvastatin (LIPITOR) 40 MG tablet TAKE 1 TABLET BY MOUTH EVERY DAY 30 tablet 11  . cetirizine (ZYRTEC) 10 MG tablet Take 10 mg by mouth daily.     . Glucose Blood (FREESTYLE LITE TEST VI)     . hydrochlorothiazide (HYDRODIURIL) 25 MG tablet TAKE 1 TABLET(25 MG) BY MOUTH DAILY 90 tablet 3  . Insulin Pen Needle (PEN NEEDLES) 32G X 4 MM MISC 1 each by Does not apply route as directed. 100 each 6  . INVOKANA 100 MG TABS tablet TAKE 1 TABLET BY MOUTH EVERY DAY BEFORE BREAKFAST 30 tablet 3  . Liraglutide (VICTOZA) 18 MG/3ML SOPN 1.2 mg SQ q AM 15 mL 6  . metFORMIN (GLUCOPHAGE) 1000 MG tablet TAKE 1 TABLET BY MOUTH TWICE DAILY 180 tablet 1  . pantoprazole (PROTONIX) 40 MG tablet TAKE 1 TABLET(40 MG) BY MOUTH DAILY 90 tablet 3  . amoxicillin-clavulanate (AUGMENTIN) 875-125 MG tablet Take 1 tablet by mouth 2 (two) times daily. (Patient not taking: Reported on 02/21/2017) 20 tablet 0  . benzonatate (TESSALON) 200 MG capsule Take 1 capsule (200 mg total) by mouth 2 (two) times daily as needed for cough. (Patient not taking: Reported on 02/21/2017) 20 capsule 0   No facility-administered medications prior to visit.  No Known Allergies  ROS As per HPI  PE: Blood pressure 122/84, pulse 72, temperature 98.3 F (36.8 C), temperature source Oral, resp. rate 16, height 5' 7.5" (1.715 m), weight 202 lb 8 oz (91.9 kg), SpO2 97 %. Gen: Alert, well appearing.  Patient is oriented to person, place, time, and situation. AFFECT: pleasant, lucid thought and speech. Foot exam - both sides normal; no swelling, tenderness or skin or vascular lesions. Color and temperature is normal. Sensation is intact. Peripheral pulses are palpable. Toenails are normal.   LABS:  Lab Results  Component Value Date   TSH 1.11 07/11/2016   Lab Results  Component Value Date   WBC 6.7 07/11/2016   HGB 16.0 07/11/2016   HCT 48.0 07/11/2016   MCV 91.3 07/11/2016   PLT 196.0 07/11/2016   Lab Results  Component  Value Date   CREATININE 1.03 10/24/2016   BUN 16 10/24/2016   NA 140 10/24/2016   K 4.4 10/24/2016   CL 100 10/24/2016   CO2 33 (H) 10/24/2016   Lab Results  Component Value Date   ALT 27 07/11/2016   AST 20 07/11/2016   ALKPHOS 58 07/11/2016   BILITOT 0.7 07/11/2016   Lab Results  Component Value Date   CHOL 204 (H) 07/11/2016   Lab Results  Component Value Date   HDL 43.60 07/11/2016   Lab Results  Component Value Date   LDLCALC 99 11/26/2012   Lab Results  Component Value Date   TRIG 351.0 (H) 07/11/2016   Lab Results  Component Value Date   CHOLHDL 5 07/11/2016   Lab Results  Component Value Date   PSA 0.51 07/11/2016   PSA 0.29 02/08/2011   Lab Results  Component Value Date   HGBA1C 7.9 (H) 10/24/2016    IMPRESSION AND PLAN:  1) DM 2, noncompliant with home monitoring. Feet exam normal today. HbA1c today.  2) HTN: The current medical regimen is effective;  continue present plan and medications. Lytes/Cr today.  3) HLD: compliant with statin/tolerating this.  Recheck FLP today.  AST/ALT normal 06/2016.  4) CRI, with GFR in 60s: lytes/cr today.  Avoid NSAIDs and dehydration.  5) Colon ca screening: he is overdue for repeat colonoscopy (see [place] section above): referred pt to [facility] today.  An After Visit Summary was printed and given to the patient.  FOLLOW UP: Return in about 3 months (around 05/24/2017) for routine chronic illness f/u.  Signed:  Crissie Sickles, MD           02/21/2017

## 2017-02-21 NOTE — Addendum Note (Signed)
Addended by: Tammi Sou on: 02/21/2017 09:40 AM   Modules accepted: Orders

## 2017-03-01 ENCOUNTER — Other Ambulatory Visit: Payer: Self-pay | Admitting: Family Medicine

## 2017-03-01 ENCOUNTER — Encounter: Payer: Self-pay | Admitting: Family Medicine

## 2017-03-01 MED ORDER — ATORVASTATIN CALCIUM 80 MG PO TABS: 80.0000 mg | ORAL_TABLET | Freq: Every day | ORAL | 3 refills | Status: DC

## 2017-03-07 ENCOUNTER — Encounter: Payer: Self-pay | Admitting: Gastroenterology

## 2017-03-20 ENCOUNTER — Telehealth: Payer: Self-pay | Admitting: Family Medicine

## 2017-03-20 NOTE — Telephone Encounter (Signed)
I finally got in contact with pt regarding recent lab results and my med recommendations. He stated he has increased his victoza to 1.2 mg daily and feels much better.  He is not willing to consider change to insulin at this time. He did not comment on whether or not he would increase his atorvastatin to 80mg  qd.

## 2017-03-23 ENCOUNTER — Ambulatory Visit (INDEPENDENT_AMBULATORY_CARE_PROVIDER_SITE_OTHER): Payer: Medicare Other | Admitting: Family Medicine

## 2017-03-23 ENCOUNTER — Encounter: Payer: Self-pay | Admitting: Family Medicine

## 2017-03-23 VITALS — BP 139/89 | HR 66 | Temp 98.0°F | Resp 20 | Wt 203.2 lb

## 2017-03-23 DIAGNOSIS — H938X1 Other specified disorders of right ear: Secondary | ICD-10-CM | POA: Diagnosis not present

## 2017-03-23 NOTE — Progress Notes (Signed)
Shaun Knight , [birth year], 66 y.o., male MRN: [id number] Patient Care Team    Relationship Specialty Notifications Start End  Tammi Sou, MD PCP - General Family Medicine  05/27/11     CC: ear fullness Subjective: Pt presents for an  OV with complaints of ear fullness of 1 week duration.  Associated symptoms include "feels like fluid" in his ear. He denies pain, fever, drainage,  decreased hearing or tinnitus. He does use Q-tips daily. He has not been swimming. He does have seasonal allergies and takes a daily antihistamine.  Depression screen PHQ 2/9 10/24/2016  Decreased Interest 0  Down, Depressed, Hopeless 0  PHQ - 2 Score 0    No Known Allergies Social History  Substance Use Topics  . Smoking status: Never Smoker  . Smokeless tobacco: Never Used  . Alcohol use No   Past Medical History:  Diagnosis Date  . Adenomatous colon polyp 04/2006   Dr. Lajoyce Corners  . Chronic renal insufficiency, stage II (mild) 2017   CrCl 60s  . Diabetes mellitus   . Diverticulosis of colon    hospitalized for diverticulitis in remote past; also had episode 08/27/14  . Fatty liver 2015   Noted on noncontrast CT done for eval for kidney stones  . History of ETT    LOW RISK/NEG ETT 02/2011 w/Dr. Doreatha Lew  . History of hemorrhoids   . Hyperlipidemia    Crestor caused leg cramps  . Hypertension   . Nephrolithiasis    Saw urologist in [place] past ([facility]). Left mid ureteral stone 01/2016--ED visit.  . Renal cyst, left    hemorrhagic vs proteinacious (last f/u CT 2007 showed stability)--Dr. Lajoyce Corners.  . Shoulder pain, right     Injection on 2 consec mo at [place] (Dr. Marlou Sa) 2012   Past Surgical History:  Procedure Laterality Date  . CHALAZION EXCISION  summer 2015  . COLONOSCOPY W/ POLYPECTOMY  04/2006   Adenomatous polyps x 2; recall 5 yrs (Dr. Lajoyce Corners)  . ESOPHAGOGASTRODUODENOSCOPY  2008   Esophagitis/gastritis/GERD.  H pylori and Barrett's NEG.  Marland Kitchen HEMORRHOID SURGERY     Family History    Problem Relation Age of Onset  . Diabetes Mother   . Hypertension Mother   . Coronary artery disease Mother   . Diabetes Father   . Hypertension Father   . Coronary artery disease Father   . Lymphoma Father     non-hodgkin  . Obesity Daughter   . Cancer Paternal Grandmother     unknown   Allergies as of 03/23/2017   No Known Allergies     Medication List       Accurate as of 03/23/17  8:27 AM. Always use your most recent med list.          atorvastatin 80 MG tablet Commonly known as:  LIPITOR Take 1 tablet (80 mg total) by mouth daily.   canagliflozin 100 MG Tabs tablet Commonly known as:  INVOKANA TAKE 1 TABLET BY MOUTH EVERY DAY BEFORE BREAKFAST   cetirizine 10 MG tablet Commonly known as:  ZYRTEC Take 10 mg by mouth daily.   FREESTYLE LITE TEST VI   hydrochlorothiazide 25 MG tablet Commonly known as:  HYDRODIURIL TAKE 1 TABLET(25 MG) BY MOUTH DAILY   liraglutide 18 MG/3ML Sopn Commonly known as:  VICTOZA 1.2 mg SQ q AM   metFORMIN 1000 MG tablet Commonly known as:  GLUCOPHAGE TAKE 1 TABLET BY MOUTH TWICE DAILY   pantoprazole 40 MG tablet Commonly known  as:  PROTONIX TAKE 1 TABLET(40 MG) BY MOUTH DAILY   Pen Needles 32G X 4 MM Misc 1 each by Does not apply route as directed.       No results found for this or any previous visit (from the past 24 hour(s)). No results found.   ROS: Negative, with the exception of above mentioned in HPI   Objective:  BP 139/89 (BP Location: Right Arm, Patient Position: Sitting, Cuff Size: Large)   Pulse 66   Temp 98 F (36.7 C)   Resp 20   Wt 203 lb 4 oz (92.2 kg)   SpO2 97%   BMI 31.36 kg/m  Body mass index is 31.36 kg/m. Gen: Afebrile. No acute distress. Nontoxic in appearance, well developed, well nourished.  HENT: AT. [place]. Bilateral TM visualized without erythema, bulging or perforation. Minimal fullness right TM with small air bubble. Minimal/normal cerumen visualized. EAM normal, no pain with movement  of external ear.  MMM, no oral lesions. Bilateral nares with erythema. Throat without erythema or exudates. No cough. No hoarseness. Eyes:Pupils Equal Round Reactive to light, Extraocular movements intact,  Conjunctiva without redness, discharge or icterus. Neck/lymp/endocrine: Supple,no lymphadenopathy  Assessment/Plan: Shaun Knight is a 67 y.o. male present for  OV for  Sensation of fullness in right ear - Pt exam more consistent with mild allergy symptoms. Performed ear lavage without instrumentation in office to clear possible debris located near tympanic membrane. This did not improve his symptoms. He he feels there is fluid at the distal outer portion superior/anterior to tragus he can hear it on pushing over this area.  Exam to this portion is normal and consistent bilaterally today. - Discussed trying Flonase to his allergy regimen and instructed him on the [facility]. - Avoid Q-tip use if possible. - If worsening or not resolved, follow-up with PCP.   Reviewed expectations re: course of current medical issues.  Discussed self-management of symptoms.  Outlined signs and symptoms indicating need for more acute intervention.  Patient verbalized understanding and all questions were answered.  Patient received an After-Visit Summary.   electronically signed by:  Howard Pouch, DO  [place]

## 2017-03-23 NOTE — Patient Instructions (Addendum)
Use Galbreath maneuver a few times a day.  Start Flonase once a day for a few weeks.   This maybe more allergy related, no abnormality visualized on exam.   Try to avoid Q-tips.

## 2017-03-27 ENCOUNTER — Other Ambulatory Visit: Payer: Self-pay | Admitting: *Deleted

## 2017-03-27 MED ORDER — OSELTAMIVIR PHOSPHATE 75 MG PO CAPS: 75.0000 mg | ORAL_CAPSULE | Freq: Every day | ORAL | 0 refills | Status: DC

## 2017-04-13 ENCOUNTER — Ambulatory Visit (AMBULATORY_SURGERY_CENTER): Payer: Self-pay

## 2017-04-13 VITALS — Ht 67.5 in | Wt 203.2 lb

## 2017-04-13 DIAGNOSIS — Z86010 Personal history of colonic polyps: Secondary | ICD-10-CM

## 2017-04-13 MED ORDER — NA SULFATE-K SULFATE-MG SULF 17.5-3.13-1.6 GM/177ML PO SOLN: ORAL | 0 refills | Status: DC

## 2017-04-13 NOTE — Progress Notes (Signed)
Per pt, no allergies to soy or egg products.Pt not taking any weight loss meds or using  O2 at home.   Pt refused Emmi video. 

## 2017-04-25 ENCOUNTER — Encounter: Payer: Self-pay | Admitting: Gastroenterology

## 2017-04-25 ENCOUNTER — Ambulatory Visit (AMBULATORY_SURGERY_CENTER): Payer: Medicare Other | Admitting: Gastroenterology

## 2017-04-25 VITALS — BP 123/79 | HR 62 | Temp 96.4°F | Resp 10 | Ht 67.0 in | Wt 203.0 lb

## 2017-04-25 DIAGNOSIS — D124 Benign neoplasm of descending colon: Secondary | ICD-10-CM

## 2017-04-25 DIAGNOSIS — K5730 Diverticulosis of large intestine without perforation or abscess without bleeding: Secondary | ICD-10-CM | POA: Diagnosis not present

## 2017-04-25 DIAGNOSIS — Z86010 Personal history of colonic polyps: Secondary | ICD-10-CM

## 2017-04-25 MED ORDER — SODIUM CHLORIDE 0.9 % IV SOLN: 500.0000 mL | INTRAVENOUS | Status: DC

## 2017-04-25 NOTE — Progress Notes (Signed)
Called to room to assist during endoscopic procedure.  Patient ID and intended procedure confirmed with present staff. Received instructions for my participation in the procedure from the performing physician.  

## 2017-04-25 NOTE — Progress Notes (Signed)
Spontaneous respirations throughout. VSS. Resting comfortably. To PACU on room air. Report to  Penny RN.  

## 2017-04-25 NOTE — Op Note (Signed)
[place] Patient Name: Shaun Knight Procedure Date: 04/25/2017 9:22 AM MRN: [id number] Endoscopist: Milus Banister , MD Age: 66 Referring MD:  Date of Birth: 08/16/1951 Gender: Male Account #: [id number] Procedure:                Colonoscopy Indications:              High risk colon cancer surveillance: Personal                            history of colonic polyps: colonoscopy Dr. Lajoyce Corners 2007                            found at least 2 subCM adenomas Medicines:                Monitored Anesthesia Care Procedure:                Pre-Anesthesia Assessment:                           - Prior to the procedure, a History and Physical                            was performed, and patient medications and                            allergies were reviewed. The patient's tolerance of                            previous anesthesia was also reviewed. The risks                            and benefits of the procedure and the sedation                            options and risks were discussed with the patient.                            All questions were answered, and informed consent                            was obtained. Prior Anticoagulants: The patient has                            taken no previous anticoagulant or antiplatelet                            agents. ASA Grade Assessment: II - A patient with                            mild systemic disease. After reviewing the risks                            and benefits, the patient was deemed in  satisfactory condition to undergo the procedure.                           After obtaining informed consent, the colonoscope                            was passed under direct vision. Throughout the                            procedure, the patient's blood pressure, pulse, and                            oxygen saturations were monitored continuously. The                            Colonoscope was introduced through  the anus and                            advanced to the the cecum, identified by                            appendiceal orifice and ileocecal valve. The                            colonoscopy was performed without difficulty. The                            patient tolerated the procedure well. The quality                            of the bowel preparation was adequate to identify                            polyps. The ileocecal valve, appendiceal orifice,                            and rectum were photographed. Scope In: 9:33:29 AM Scope Out: 9:42:58 AM Scope Withdrawal Time: 0 hours 7 minutes 57 seconds  Total Procedure Duration: 0 hours 9 minutes 29 seconds  Findings:                 A 4 mm polyp was found in the descending colon. The                            polyp was sessile. The polyp was removed with a                            cold snare. Resection and retrieval were complete.                           Many small and large-mouthed diverticula were found                            in the left colon.  The exam was otherwise without abnormality on                            direct and retroflexion views. Complications:            No immediate complications. Estimated blood loss:                            None. Estimated Blood Loss:     Estimated blood loss: none. Impression:               - One 4 mm polyp in the descending colon, removed                            with a cold snare. Resected and retrieved.                           - Diverticulosis in the left colon.                           - The examination was otherwise normal on direct                            and retroflexion views. Recommendation:           - Patient has a contact number available for                            emergencies. The signs and symptoms of potential                            delayed complications were discussed with the                            patient. Return to  normal activities tomorrow.                            Written discharge instructions were provided to the                            patient.                           - Resume previous diet.                           - Continue present medications.                           You will receive a letter within 2-3 weeks with the                            pathology results and my final recommendations.                           If the polyp(s) is proven to be 'pre-cancerous' on  pathology, you will need repeat colonoscopy in 5                            years. If the polyp(s) is NOT 'precancerous' on                            pathology then you should repeat colon cancer                            screening in 10 years with colonoscopy without need                            for colon cancer screening by any method prior to                            then (including stool testing). Milus Banister, MD 04/25/2017 9:46:05 AM This report has been signed electronically.

## 2017-04-25 NOTE — Patient Instructions (Signed)
YOU HAD AN ENDOSCOPIC PROCEDURE TODAY AT [facility]:   Refer to the procedure report that was given to you for any specific questions about what was found during the examination.  If the procedure report does not answer your questions, please call your gastroenterologist to clarify.  If you requested that your care partner not be given the details of your procedure findings, then the procedure report has been included in a sealed envelope for you to review at your convenience later.  YOU SHOULD EXPECT: Some feelings of bloating in the abdomen. Passage of more gas than usual.  Walking can help get rid of the air that was put into your GI tract during the procedure and reduce the bloating. If you had a lower endoscopy (such as a colonoscopy or flexible sigmoidoscopy) you may notice spotting of blood in your stool or on the toilet paper. If you underwent a bowel prep for your procedure, you may not have a normal bowel movement for a few days.  Please Note:  You might notice some irritation and congestion in your nose or some drainage.  This is from the oxygen used during your procedure.  There is no need for concern and it should clear up in a day or so.  SYMPTOMS TO REPORT IMMEDIATELY:   Following lower endoscopy (colonoscopy or flexible sigmoidoscopy):  Excessive amounts of blood in the stool  Significant tenderness or worsening of abdominal pains  Swelling of the abdomen that is new, acute  Fever of 100F or higher   For urgent or emergent issues, a gastroenterologist can be reached at any hour by calling [phone].   DIET:  We do recommend a small meal at first, but then you may proceed to your regular diet.  Drink plenty of fluids but you should avoid alcoholic beverages for 24 hours.  ACTIVITY:  You should plan to take it easy for the rest of today and you should NOT DRIVE or use heavy machinery until tomorrow (because of the sedation medicines used during the test).     FOLLOW UP: Our staff will call the number listed on your records the next business day following your procedure to check on you and address any questions or concerns that you may have regarding the information given to you following your procedure. If we do not reach you, we will leave a message.  However, if you are feeling well and you are not experiencing any problems, there is no need to return our call.  We will assume that you have returned to your regular daily activities without incident.  If any biopsies were taken you will be contacted by phone or by letter within the next 1-3 weeks.  Please call us at [phone] if you have not heard about the biopsies in 3 weeks.    SIGNATURES/CONFIDENTIALITY: You and/or your care partner have signed paperwork which will be entered into your electronic medical record.  These signatures attest to the fact that that the information above on your After Visit Summary has been reviewed and is understood.  Full responsibility of the confidentiality of this discharge information lies with you and/or your care-partner.    Information on polyps and diverticulosis given to you today

## 2017-04-26 ENCOUNTER — Telehealth: Payer: Self-pay | Admitting: *Deleted

## 2017-04-26 NOTE — Telephone Encounter (Signed)
  Follow up Call-  Call back number 04/25/2017  Post procedure Call Back phone  # [phone]  Permission to leave phone message No  Some recent data might be hidden     Patient questions:  Do you have a fever, pain , or abdominal swelling? No. Pain Score  0 *  Have you tolerated food without any problems? Yes.    Have you been able to return to your normal activities? Yes.    Do you have any questions about your discharge instructions: Diet   No. Medications  No. Follow up visit  No.  Do you have questions or concerns about your Care? No.  Actions: * If pain score is 4 or above: No action needed, pain <4.

## 2017-05-01 ENCOUNTER — Encounter: Payer: Self-pay | Admitting: Gastroenterology

## 2017-05-03 ENCOUNTER — Telehealth: Payer: Self-pay | Admitting: Family Medicine

## 2017-05-03 MED ORDER — INSULIN GLARGINE 100 UNIT/ML SOLOSTAR PEN: 10.0000 [IU] | PEN_INJECTOR | Freq: Every day | SUBCUTANEOUS | 3 refills | Status: DC

## 2017-05-03 MED ORDER — INSULIN DETEMIR 100 UNIT/ML FLEXPEN: 10.0000 [IU] | PEN_INJECTOR | Freq: Every day | SUBCUTANEOUS | 3 refills | Status: DC

## 2017-05-03 MED ORDER — BLOOD GLUCOSE METER KIT: PACK | 0 refills | Status: DC

## 2017-05-03 NOTE — Telephone Encounter (Signed)
Pt called and said he is in the donut hole and cannot/will not continue to buy invokana and victoza. He'll continue metformin and he is agreeable to adding lantus or levemir qhs.  We'll send in glucometer with strips and have him check glucose fasting daily. He'll call or return in 1 mo to go over glucoses.  Heather: will you arrange for him to get a glucometer and strips at his pharmacy--you know I am horrible at doing that.--thx!

## 2017-05-03 NOTE — Telephone Encounter (Signed)
Rx printed, signed and faxed to [facility].

## 2017-05-03 NOTE — Addendum Note (Signed)
Addended by: Onalee Hua on: 05/03/2017 11:50 AM   Modules accepted: Orders

## 2017-05-03 NOTE — Telephone Encounter (Signed)
Received fax from [facility] stating that pts insurance will not cover Lantus. Pts insurance prefers Levemir, Therapist, music, or [place] flex. SW Dr. Anitra Lauth he stated that send in Rx for Levemir same dosing. Rx sent.

## 2017-05-05 ENCOUNTER — Encounter: Payer: Self-pay | Admitting: Family Medicine

## 2017-05-11 ENCOUNTER — Encounter: Payer: Self-pay | Admitting: Family Medicine

## 2017-05-11 ENCOUNTER — Ambulatory Visit (INDEPENDENT_AMBULATORY_CARE_PROVIDER_SITE_OTHER): Payer: Medicare Other | Admitting: Family Medicine

## 2017-05-11 VITALS — BP 121/77 | HR 78 | Temp 98.6°F | Resp 16 | Wt 203.0 lb

## 2017-05-11 DIAGNOSIS — Z79899 Other long term (current) drug therapy: Secondary | ICD-10-CM

## 2017-05-11 DIAGNOSIS — W57XXXA Bitten or stung by nonvenomous insect and other nonvenomous arthropods, initial encounter: Secondary | ICD-10-CM | POA: Diagnosis not present

## 2017-05-11 MED ORDER — DOXYCYCLINE HYCLATE 100 MG PO CAPS: ORAL_CAPSULE | ORAL | 0 refills | Status: DC

## 2017-05-11 NOTE — Patient Instructions (Signed)
Apply over the counter hydrocortisone ointment to bites two times per day as needed.

## 2017-05-11 NOTE — Progress Notes (Signed)
OFFICE VISIT  05/11/2017   CC:  Chief Complaint  Patient presents with  . Tick Removal    tick bite #10   HPI:    Patient is a 66 y.o. Caucasian male who presents for tick bite. Two weeks ago he got about 10 ticks on him after walking in the woods.  He got them all off w/in 12-24 hours--"seed ticks". Then, yesterday he noted a tick stuck on skin in R armpit.  He is not sure how long it has been there. Says he feels well. ROS: no HA, no neck stiffness, no fevers, no rash, no myalgias/arthralgias, no fevers.  Also, pt complains that the levemir I recently rx'd in place of his victoza and invokana (due to pt being in donut hole) was way too expensive so he did not get it.  His pharmacist gave him coupons so he could get victoza pen for this month and one for next month.  Past Medical History:  Diagnosis Date  . Adenomatous colon polyp 04/2006; 2018   2007 Dr. Lajoyce Corners; 2018 Dr. Stefano Gaul 04/2022.  Marland Kitchen Chronic renal insufficiency, stage II (mild) 2017   CrCl 60s  . Diabetes mellitus   . Diverticulosis of colon    hospitalized for diverticulitis in remote past; also had episode 08/27/14  . Fatty liver 2015   Noted on noncontrast CT done for eval for kidney stones  . History of ETT    LOW RISK/NEG ETT 02/2011 w/Dr. Doreatha Lew  . History of hemorrhoids   . Hyperlipidemia    Crestor caused leg cramps  . Hypertension   . Nephrolithiasis    Saw urologist in [place] past ([facility]). Left mid ureteral stone 01/2016--ED visit.  . Renal cyst, left    hemorrhagic vs proteinacious (last f/u CT 2007 showed stability)--Dr. Lajoyce Corners.  . Shoulder pain, right     Injection on 2 consec mo at [facility] (Dr. Marlou Sa) 2012    Past Surgical History:  Procedure Laterality Date  . CHALAZION EXCISION  summer 2015  . COLONOSCOPY W/ POLYPECTOMY  04/2006; 04/2017   2007: Adenomatous polyps x 2; recall 5 yrs (Dr. Lajoyce Corners).  Repeat 04/2017 (Dr. Philomena Doheny adenoma x1 + sigmoid diverticulosis).  Recall 5 yrs.  .  ESOPHAGOGASTRODUODENOSCOPY  2008   Esophagitis/gastritis/GERD.  H pylori and Barrett's NEG.  Marland Kitchen HEMORRHOID SURGERY    . NOSE SURGERY     deviated septum    Outpatient Medications Prior to Visit  Medication Sig Dispense Refill  . atorvastatin (LIPITOR) 80 MG tablet Take 1 tablet (80 mg total) by mouth daily. 90 tablet 3  . blood glucose meter kit and supplies Use to check fasting blood sugars once daily 1 each 0  . cetirizine (ZYRTEC) 10 MG tablet Take 10 mg by mouth daily.     . Glucose Blood (FREESTYLE LITE TEST VI)     . hydrochlorothiazide (HYDRODIURIL) 25 MG tablet TAKE 1 TABLET(25 MG) BY MOUTH DAILY 90 tablet 3  . Insulin Pen Needle (PEN NEEDLES) 32G X 4 MM MISC 1 each by Does not apply route as directed. 100 each 6  . metFORMIN (GLUCOPHAGE) 1000 MG tablet TAKE 1 TABLET BY MOUTH TWICE DAILY 180 tablet 1  . pantoprazole (PROTONIX) 40 MG tablet TAKE 1 TABLET(40 MG) BY MOUTH DAILY 90 tablet 3  . Insulin Detemir (LEVEMIR FLEXTOUCH) 100 UNIT/ML Pen Inject 10 Units into the skin daily at 10 pm. 5 pen 3   Facility-Administered Medications Prior to Visit  Medication Dose Route Frequency Provider Last Rate  Last Dose  . 0.9 %  sodium chloride infusion  500 mL Intravenous Continuous Milus Banister, MD        No Known Allergies  ROS As per HPI  PE: Blood pressure 121/77, pulse 78, temperature 98.6 F (37 C), temperature source Oral, resp. rate 16, weight 203 lb (92.1 kg), SpO2 98 %. Gen: Alert, well appearing.  Patient is oriented to person, place, time, and situation. AFFECT: pleasant, lucid thought and speech. SKIN: no rash.  He has some scattered small pink papules c/w insect bites, the one in R axilla being largest.  No tenderness, no fluctuance to suggest abscess, no erythema to suggest cellulitis.  LABS:    Chemistry      Component Value Date/Time   NA 138 02/21/2017 0823   K 4.0 02/21/2017 0823   CL 98 02/21/2017 0823   CO2 32 02/21/2017 0823   BUN 23 02/21/2017 0823    CREATININE 1.09 02/21/2017 0823      Component Value Date/Time   CALCIUM 9.5 02/21/2017 0823   ALKPHOS 58 07/11/2016 0903   AST 20 07/11/2016 0903   ALT 27 07/11/2016 0903   BILITOT 0.7 07/11/2016 0903     GFR 02/21/17 = 72  IMPRESSION AND PLAN:  1) Tick bites: the ones 2 wks ago are no problem--were not on him long enough to warrant concern for passing on tick borne illness, plus he has no symptoms of this. However, the one he got out of R axilla yesterday was on him an unclear/undetermined amount of time, so I'll assume it was there 48-72h and give him a prophylactic dose of doxycycline to prevent lyme dz.  Doxycycline 229m x 1 dose rx'd today. Recommended he put otc hydrocortisone ointment on the tick bites as needed.  2) Polypharmacy/DM 2:  pt in donut hole and cannot afford his meds except his metformin (and for the next 1-2 months he can get victoza). When he is almost out of victoza I'll rx NPH for him--he'll call and let me know.  An After Visit Summary was printed and given to the patient.  FOLLOW UP: Return if symptoms worsen or fail to improve.  Signed:  PCrissie Sickles MD           05/11/2017

## 2017-05-24 ENCOUNTER — Encounter: Payer: Self-pay | Admitting: Family Medicine

## 2017-05-24 ENCOUNTER — Ambulatory Visit (INDEPENDENT_AMBULATORY_CARE_PROVIDER_SITE_OTHER): Payer: Medicare Other | Admitting: Family Medicine

## 2017-05-24 VITALS — BP 135/83 | HR 67 | Temp 98.2°F | Resp 16 | Ht 67.5 in | Wt 201.0 lb

## 2017-05-24 DIAGNOSIS — I10 Essential (primary) hypertension: Secondary | ICD-10-CM

## 2017-05-24 DIAGNOSIS — E118 Type 2 diabetes mellitus with unspecified complications: Secondary | ICD-10-CM | POA: Diagnosis not present

## 2017-05-24 DIAGNOSIS — E7800 Pure hypercholesterolemia, unspecified: Secondary | ICD-10-CM

## 2017-05-24 LAB — LIPID PANEL
CHOL/HDL RATIO: 5
CHOLESTEROL: 183 mg/dL (ref 0–200)
HDL: 36.5 mg/dL — AB (ref >39.00)
NonHDL: 146.16
Triglycerides: 257 mg/dL — ABNORMAL HIGH (ref 0.0–149.0)
VLDL: 51.4 mg/dL — AB (ref 0.0–40.0)

## 2017-05-24 LAB — LDL CHOLESTEROL, DIRECT: Direct LDL: 90 mg/dL

## 2017-05-24 LAB — HEMOGLOBIN A1C: HEMOGLOBIN A1C: 8.7 % — AB (ref 4.6–6.5)

## 2017-05-24 MED ORDER — CANAGLIFLOZIN 100 MG PO TABS: 100.0000 mg | ORAL_TABLET | Freq: Every day | ORAL | 6 refills | Status: DC

## 2017-05-24 NOTE — Progress Notes (Signed)
OFFICE VISIT  05/24/2017   CC:  Chief Complaint  Patient presents with  . Follow-up    RCI, pt is fasting.    HPI:    Patient is a 66 y.o. Caucasian male who presents for 3 mo f/u DM 2, HTN, HLD. Last visit he declined to start insulin due to cost.  He stayed on victoza, is taking 1.2 mg SQ qd and metformin 1000 mg bid, also still taking invokana. Also, his cholesterol was still too high so increased his atorvastatin to 91m qd.  DM: checking glucose once per week--120 last week. 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 HTN: no home bp checks, compliant with hctz. HLD: tolerating atorva 876mqd.   Review of Systems  Constitutional: Negative for fatigue and fever.  HENT: Negative for congestion and sore throat.   Eyes: Negative for visual disturbance.  Respiratory: Negative for cough.   Cardiovascular: Negative for chest pain.  Gastrointestinal: Negative for abdominal pain and nausea.  Genitourinary: Negative for dysuria.  Musculoskeletal: Negative for back pain and joint swelling.  Skin: Negative for rash.  Neurological: Negative for weakness and headaches.  Hematological: Negative for adenopathy.     Past Medical History:  Diagnosis Date  . Adenomatous colon polyp 04/2006; 2018   2007 Dr. OrLajoyce Corners2018 Dr. JaStefano Gaul/2023.  . Marland Kitchenhronic renal insufficiency, stage II (mild) 2017   CrCl 60s  . Diabetes mellitus   . Diverticulosis of colon    hospitalized for diverticulitis in remote past; also had episode 08/27/14  . Fatty liver 2015   Noted on noncontrast CT done for eval for kidney stones  . History of ETT    LOW RISK/NEG ETT 02/2011 w/Dr. TeDoreatha Lew. History of hemorrhoids   . Hyperlipidemia    Crestor caused leg cramps  . Hypertension   . Nephrolithiasis    [place] Left mid ureteral stone 01/2016--ED visit.  . Renal cyst, left    hemorrhagic vs proteinacious (last f/u CT 2007 showed  stability)--Dr. OrLajoyce Corners . Shoulder pain, right     [facility]    Past Surgical History:  Procedure Laterality Date  . CHALAZION EXCISION  summer 2015  . COLONOSCOPY W/ POLYPECTOMY  04/2006; 04/2017   2007: Adenomatous polyps x 2; recall 5 yrs (Dr. OrLajoyce Corners  Repeat 04/2017 (Dr. JaPhilomena Dohenydenoma x1 + sigmoid diverticulosis).  Recall 5 yrs.  . ESOPHAGOGASTRODUODENOSCOPY  2008   Esophagitis/gastritis/GERD.  H pylori and Barrett's NEG.  . Marland KitchenEMORRHOID SURGERY    . NOSE SURGERY     deviated septum    Outpatient Medications Prior to Visit  Medication Sig Dispense Refill  . atorvastatin (LIPITOR) 80 MG tablet Take 1 tablet (80 mg total) by mouth daily. 90 tablet 3  . cetirizine (ZYRTEC) 10 MG tablet Take 10 mg by mouth daily.     . Glucose Blood (FREESTYLE LITE TEST VI)     . hydrochlorothiazide (HYDRODIURIL) 25 MG tablet TAKE 1 TABLET(25 MG) BY MOUTH DAILY 90 tablet 3  . Insulin Pen Needle (PEN NEEDLES) 32G X 4 MM MISC 1 each by Does not apply route as directed. 100 each 6  . metFORMIN (GLUCOPHAGE) 1000 MG tablet TAKE 1 TABLET BY MOUTH TWICE DAILY 180 tablet 1  . pantoprazole (PROTONIX) 40 MG tablet TAKE 1 TABLET(40 MG) BY MOUTH DAILY 90 tablet 3  . VICTOZA 18 MG/3ML  SOPN Inject 0.12 mg into the skin daily.     . blood glucose meter kit and supplies Use to check fasting blood sugars once daily (Patient not taking: Reported on 05/24/2017) 1 each 0  . doxycycline (VIBRAMYCIN) 100 MG capsule 2 tabs po x 1 dose (Patient not taking: Reported on 05/24/2017) 2 capsule 0   Facility-Administered Medications Prior to Visit  Medication Dose Route Frequency Provider Last Rate Last Dose  . 0.9 %  sodium chloride infusion  500 mL Intravenous Continuous Milus Banister, MD        No Known Allergies  ROS As per HPI  PE: Blood pressure 135/83, pulse 67, temperature 98.2 F (36.8 C), temperature source Oral, resp. rate 16, height 5' 7.5" (1.715 m), weight  201 lb (91.2 kg), SpO2 96 %. Gen: Alert, well appearing.  Patient is oriented to person, place, time, and situation. AFFECT: pleasant, lucid thought and speech. No further exam today.  LABS:  Lab Results  Component Value Date   TSH 1.11 07/11/2016   Lab Results  Component Value Date   WBC 6.7 07/11/2016   HGB 16.0 07/11/2016   HCT 48.0 07/11/2016   MCV 91.3 07/11/2016   PLT 196.0 07/11/2016   Lab Results  Component Value Date   CREATININE 1.09 02/21/2017   BUN 23 02/21/2017   NA 138 02/21/2017   K 4.0 02/21/2017   CL 98 02/21/2017   CO2 32 02/21/2017   Lab Results  Component Value Date   ALT 27 07/11/2016   AST 20 07/11/2016   ALKPHOS 58 07/11/2016   BILITOT 0.7 07/11/2016   Lab Results  Component Value Date   CHOL 243 (H) 02/21/2017   Lab Results  Component Value Date   HDL 39.20 02/21/2017   Lab Results  Component Value Date   LDLCALC 99 11/26/2012   Lab Results  Component Value Date   TRIG 356.0 (H) 02/21/2017   Lab Results  Component Value Date   CHOLHDL 6 02/21/2017   Lab Results  Component Value Date   PSA 0.51 07/11/2016   PSA 0.29 02/08/2011   Lab Results  Component Value Date   HGBA1C 10.0 (H) 02/21/2017   IMPRESSION AND PLAN:  1) DM 2, poor control historically. 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 If A1c not significantly improved today will try adding DPP 4 inhibitor or sulfonylurea. Needs to tighten up diabetic diet.  2) HTN: The current medical regimen is effective;  continue present plan and medications. Encouraged pt to get home bp cuff for home bp/hr monitoring. Lytes/cr good 3 mo ago.  3) Hyperlipidemia: recheck FLP today to see if increased of atorva to 70m helped.  Signed:  PCrissie Sickles MD           05/24/2017   FOLLOW UP: Return in about 3 months (around 08/24/2017) for annual CPE (fasting).  An After Visit Summary was printed and given to the patient.

## 2017-05-25 ENCOUNTER — Other Ambulatory Visit: Payer: Self-pay | Admitting: Family Medicine

## 2017-05-25 MED ORDER — PIOGLITAZONE HCL 15 MG PO TABS
15.0000 mg | ORAL_TABLET | Freq: Every day | ORAL | 2 refills | Status: DC
Start: 2017-05-25 — End: 2017-08-25

## 2017-05-26 MED ORDER — CANAGLIFLOZIN 100 MG PO TABS: 100.0000 mg | ORAL_TABLET | Freq: Every day | ORAL | 1 refills | Status: DC

## 2017-05-26 NOTE — Addendum Note (Signed)
Addended by: Helayne Seminole on: 05/26/2017 04:17 PM   Modules accepted: Orders

## 2017-06-09 ENCOUNTER — Other Ambulatory Visit: Payer: Self-pay | Admitting: Family Medicine

## 2017-06-23 ENCOUNTER — Other Ambulatory Visit: Payer: Self-pay | Admitting: Family Medicine

## 2017-07-04 ENCOUNTER — Other Ambulatory Visit: Payer: Self-pay | Admitting: Family Medicine

## 2017-08-02 ENCOUNTER — Other Ambulatory Visit: Payer: Self-pay | Admitting: Family Medicine

## 2017-08-02 NOTE — Telephone Encounter (Signed)
[facility] [place] 

## 2017-08-08 ENCOUNTER — Other Ambulatory Visit: Payer: Self-pay | Admitting: Family Medicine

## 2017-08-24 NOTE — Progress Notes (Deleted)
Subjective:   Shaun Knight is a 66 y.o. male who presents for an Initial Medicare Annual Wellness Visit.  Review of Systems  No ROS.  Medicare Wellness Visit. Additional risk factors are reflected in the social history.    Sleep patterns:  Home Safety/Smoke Alarms: Feels safe in home. Smoke alarms in place.  Living environment; residence and Firearm Safety:  [place] Safety/Bike Helmet: Wears seat belt.    Male:   CCS-Colonoscopy 04/25/2017, polyps. Recall 5 years.       PSA-  Lab Results  Component Value Date   PSA 0.51 07/11/2016   PSA 0.29 02/08/2011      Objective:    There were no vitals filed for this visit. There is no height or weight on file to calculate BMI.  Current Medications (verified) Outpatient Encounter Prescriptions as of 08/25/2017  Medication Sig  . atorvastatin (LIPITOR) 80 MG tablet Take 1 tablet (80 mg total) by mouth daily.  . canagliflozin (INVOKANA) 100 MG TABS tablet Take 1 tablet (100 mg total) by mouth daily before breakfast.  . cetirizine (ZYRTEC) 10 MG tablet Take 10 mg by mouth daily.   . Glucose Blood (FREESTYLE LITE TEST VI)   . hydrochlorothiazide (HYDRODIURIL) 25 MG tablet TAKE 1 TABLET(25 MG) BY MOUTH DAILY  . metFORMIN (GLUCOPHAGE) 1000 MG tablet TAKE 1 TABLET BY MOUTH TWICE DAILY  . NOVOFINE 32G X 6 MM MISC USE AS DIRECTED  . ONE TOUCH ULTRA TEST test strip TEST SUGAR DAILY  . ONETOUCH DELICA LANCETS 09B MISC USE AS DIRECTED  . pantoprazole (PROTONIX) 40 MG tablet TAKE 1 TABLET(40 MG) BY MOUTH DAILY  . pioglitazone (ACTOS) 15 MG tablet Take 1 tablet (15 mg total) by mouth daily.  Marland Kitchen VICTOZA 18 MG/3ML SOPN Inject 0.12 mg into the skin daily.    Facility-Administered Encounter Medications as of 08/25/2017  Medication  . 0.9 %  sodium chloride infusion    Allergies (verified) Patient has no known allergies.   History: Past Medical History:  Diagnosis Date  . Adenomatous colon polyp 04/2006; 2018   2007 Dr. Lajoyce Corners; 2018 Dr.  Stefano Gaul 04/2022.  Marland Kitchen Chronic renal insufficiency, stage II (mild) 2017   CrCl 60s  . Diabetes mellitus   . Diverticulosis of colon    hospitalized for diverticulitis in remote past; also had episode 08/27/14  . Fatty liver 2015   Noted on noncontrast CT done for eval for kidney stones  . History of ETT    LOW RISK/NEG ETT 02/2011 w/Dr. Doreatha Lew  . History of hemorrhoids   . Hyperlipidemia    Crestor caused leg cramps  . Hypertension   . Nephrolithiasis    Saw urologist in [place] past ([facility] Left mid ureteral stone 01/2016--ED visit.  . Renal cyst, left    hemorrhagic vs proteinacious (last f/u CT 2007 showed stability)--Dr. Lajoyce Corners.  . Shoulder pain, right     Injection on 2 consec mo at [place] (Dr. Marlou Sa) 2012   Past Surgical History:  Procedure Laterality Date  . CHALAZION EXCISION  summer 2015  . COLONOSCOPY W/ POLYPECTOMY  04/2006; 04/2017   2007: Adenomatous polyps x 2; recall 5 yrs (Dr. Lajoyce Corners).  Repeat 04/2017 (Dr. Philomena Doheny adenoma x1 + sigmoid diverticulosis).  Recall 5 yrs.  . ESOPHAGOGASTRODUODENOSCOPY  2008   Esophagitis/gastritis/GERD.  H pylori and Barrett's NEG.  Marland Kitchen HEMORRHOID SURGERY    . NOSE SURGERY     deviated septum   Family History  Problem Relation Age of Onset  .  Diabetes Mother   . Hypertension Mother   . Coronary artery disease Mother   . Diabetes Father   . Hypertension Father   . Coronary artery disease Father   . Lymphoma Father        non-hodgkin  . Obesity Daughter   . Cancer Paternal Grandmother        unknown   Social History   Occupational History  . Not on file.   Social History Main Topics  . Smoking status: Never Smoker  . Smokeless tobacco: Never Used  . Alcohol use No  . Drug use: No  . Sexual activity: Yes    Partners: Female   Tobacco Counseling Counseling given: Not Answered   Activities of Daily Living No flowsheet data found.  Immunizations and Health Maintenance Immunization History  Administered  Date(s) Administered  . Hepatitis A, Adult 08/11/2014  . Influenza Whole 10/19/2010, 10/04/2011  . Influenza, High Dose Seasonal PF 10/24/2016  . Influenza,inj,Quad PF,6+ Mos 08/11/2014, 09/30/2015  . Pneumococcal Polysaccharide-23 07/27/2012  . Tdap 07/27/2012  . Zoster 11/13/2011   Health Maintenance Due  Topic Date Due  . Hepatitis C Screening  Sep 11, 1951  . OPHTHALMOLOGY EXAM  02/26/2016  . PNA vac Low Risk Adult (1 of 2 - PCV13) 07/19/2016  . INFLUENZA VACCINE  07/19/2017    Patient Care Team: Tammi Sou, MD as PCP - General (Family Medicine) Milus Banister, MD as Consulting Physician (Gastroenterology)  Indicate any recent Medical Services you may have received from other than [facility] providers in the past year (date may be approximate).    Assessment:   This is a routine wellness examination for Shaun Knight. Physical assessment deferred to PCP.   Hearing/Vision screen No exam data present  Dietary issues and exercise activities discussed:   Diet (meal preparation, eat out, water intake, caffeinated beverages, dairy products, fruits and vegetables):   Breakfast: Lunch:  Dinner:      Goals    None     Depression Screen PHQ 2/9 Scores 10/24/2016  PHQ - 2 Score 0    Fall Risk Fall Risk  10/24/2016  Falls in the past year? No    Cognitive Function:        Screening Tests Health Maintenance  Topic Date Due  . Hepatitis C Screening  1951/03/07  . OPHTHALMOLOGY EXAM  02/26/2016  . PNA vac Low Risk Adult (1 of 2 - PCV13) 07/19/2016  . INFLUENZA VACCINE  07/19/2017  . HEMOGLOBIN A1C  11/23/2017  . FOOT EXAM  02/21/2018  . URINE MICROALBUMIN  02/21/2018  . COLONOSCOPY  04/25/2022  . TETANUS/TDAP  07/27/2022        Plan:     I have personally reviewed and noted the following in the patient's chart:   . Medical and social history . Use of alcohol, tobacco or illicit drugs  . Current medications and supplements . Functional ability and  status . Nutritional status . Physical activity . Advanced directives . List of other physicians . Hospitalizations, surgeries, and ER visits in previous 12 months . Vitals . Screenings to include cognitive, depression, and falls . Referrals and appointments  In addition, I have reviewed and discussed with patient certain preventive protocols, quality metrics, and best practice recommendations. A written personalized care plan for preventive services as well as general preventive health recommendations were provided to patient.     Gerilyn Nestle, RN   08/24/2017

## 2017-08-25 ENCOUNTER — Ambulatory Visit (INDEPENDENT_AMBULATORY_CARE_PROVIDER_SITE_OTHER): Payer: Medicare Other | Admitting: Family Medicine

## 2017-08-25 ENCOUNTER — Encounter: Payer: Self-pay | Admitting: Family Medicine

## 2017-08-25 ENCOUNTER — Ambulatory Visit: Payer: Medicare Other | Admitting: Family Medicine

## 2017-08-25 ENCOUNTER — Ambulatory Visit: Payer: Medicare Other

## 2017-08-25 VITALS — BP 117/77 | HR 76 | Temp 98.4°F | Resp 16 | Ht 67.5 in | Wt 209.2 lb

## 2017-08-25 DIAGNOSIS — E119 Type 2 diabetes mellitus without complications: Secondary | ICD-10-CM | POA: Diagnosis not present

## 2017-08-25 DIAGNOSIS — Z9114 Patient's other noncompliance with medication regimen: Secondary | ICD-10-CM | POA: Diagnosis not present

## 2017-08-25 DIAGNOSIS — Z23 Encounter for immunization: Secondary | ICD-10-CM | POA: Diagnosis not present

## 2017-08-25 DIAGNOSIS — Z125 Encounter for screening for malignant neoplasm of prostate: Secondary | ICD-10-CM | POA: Diagnosis not present

## 2017-08-25 DIAGNOSIS — Z0000 Encounter for general adult medical examination without abnormal findings: Secondary | ICD-10-CM

## 2017-08-25 DIAGNOSIS — Z1159 Encounter for screening for other viral diseases: Secondary | ICD-10-CM

## 2017-08-25 LAB — TSH: TSH: 1.11 u[IU]/mL (ref 0.35–4.50)

## 2017-08-25 LAB — COMPREHENSIVE METABOLIC PANEL
ALT: 18 U/L (ref 0–53)
AST: 16 U/L (ref 0–37)
Albumin: 4.3 g/dL (ref 3.5–5.2)
Alkaline Phosphatase: 50 U/L (ref 39–117)
BUN: 16 mg/dL (ref 6–23)
CHLORIDE: 95 meq/L — AB (ref 96–112)
CO2: 32 meq/L (ref 19–32)
CREATININE: 1.1 mg/dL (ref 0.40–1.50)
Calcium: 9.5 mg/dL (ref 8.4–10.5)
GFR: 71.16 mL/min (ref >60.00)
Glucose, Bld: 209 mg/dL — ABNORMAL HIGH (ref 70–99)
Potassium: 4.1 mEq/L (ref 3.5–5.1)
SODIUM: 137 meq/L (ref 135–145)
Total Bilirubin: 0.9 mg/dL (ref 0.2–1.2)
Total Protein: 6.7 g/dL (ref 6.0–8.3)

## 2017-08-25 LAB — CBC WITH DIFFERENTIAL/PLATELET
BASOS ABS: 0 10*3/uL (ref 0.0–0.1)
Basophils Relative: 0.7 % (ref 0.0–3.0)
Eosinophils Absolute: 0.1 10*3/uL (ref 0.0–0.7)
Eosinophils Relative: 2.4 % (ref 0.0–5.0)
HCT: 41.6 % (ref 39.0–52.0)
HEMOGLOBIN: 14.3 g/dL (ref 13.0–17.0)
LYMPHS ABS: 1 10*3/uL (ref 0.7–4.0)
Lymphocytes Relative: 20.5 % (ref 12.0–46.0)
MCHC: 34.5 g/dL (ref 30.0–36.0)
MCV: 89.4 fl (ref 78.0–100.0)
MONOS PCT: 5.9 % (ref 3.0–12.0)
Monocytes Absolute: 0.3 10*3/uL (ref 0.1–1.0)
NEUTROS PCT: 70.5 % (ref 43.0–77.0)
Neutro Abs: 3.4 10*3/uL (ref 1.4–7.7)
Platelets: 196 10*3/uL (ref 150.0–400.0)
RBC: 4.65 Mil/uL (ref 4.22–5.81)
RDW: 13.4 % (ref 11.5–15.5)
WBC: 4.8 10*3/uL (ref 4.0–10.5)

## 2017-08-25 LAB — PSA, MEDICARE: PSA: 0.51 ng/ml (ref 0.10–4.00)

## 2017-08-25 LAB — LIPID PANEL
CHOLESTEROL: 191 mg/dL (ref 0–200)
HDL: 36.2 mg/dL — ABNORMAL LOW (ref >39.00)
NONHDL: 154.52
Total CHOL/HDL Ratio: 5
Triglycerides: 245 mg/dL — ABNORMAL HIGH (ref 0.0–149.0)
VLDL: 49 mg/dL — AB (ref 0.0–40.0)

## 2017-08-25 LAB — LDL CHOLESTEROL, DIRECT: Direct LDL: 102 mg/dL

## 2017-08-25 LAB — HEMOGLOBIN A1C: HEMOGLOBIN A1C: 10.2 % — AB (ref 4.6–6.5)

## 2017-08-25 MED ORDER — ZOSTER VAC RECOMB ADJUVANTED 50 MCG/0.5ML IM SUSR: 0.5000 mL | Freq: Once | INTRAMUSCULAR | 1 refills | Status: AC

## 2017-08-25 NOTE — Progress Notes (Signed)
Subjective:   Shaun Knight is a 66 y.o. male who presents for an Initial Medicare Annual Wellness Visit.  Review of Systems  No ROS.  Medicare Wellness Visit. Additional risk factors are reflected in the social history.  Cardiac Risk Factors include: advanced age (>78men, >32 women);diabetes mellitus;dyslipidemia;hypertension;male gender;obesity (BMI >30kg/m2);family history of premature cardiovascular disease   Sleep patterns: Sleeps 8 hours, feels rested.  Home Safety/Smoke Alarms: Feels safe in home. Smoke alarms in place.  Living environment; residence and Firearm Safety: Lives with wife in 1 story home.  Seat Belt Safety/Bike Helmet: Wears seat belt.   Male:   CCS-Colonoscopy 04/25/17, polyp. Recall 5 years.  PSA-  Lab Results  Component Value Date   PSA 0.51 07/11/2016   PSA 0.29 02/08/2011      Objective:    Today's Vitals   08/25/17 1025  BP: 117/77  Pulse: 76  Resp: 16  Temp: 98.4 F (36.9 C)  TempSrc: Oral  SpO2: 95%  Weight: 209 lb 4 oz (94.9 kg)  Height: 5' 7.5" (1.715 m)   Body mass index is 32.29 kg/m.  Current Medications (verified) Outpatient Encounter Prescriptions as of 08/25/2017  Medication Sig  . atorvastatin (LIPITOR) 80 MG tablet Take 1 tablet (80 mg total) by mouth daily.  . cetirizine (ZYRTEC) 10 MG tablet Take 10 mg by mouth daily.   . Glucose Blood (FREESTYLE LITE TEST VI)   . hydrochlorothiazide (HYDRODIURIL) 25 MG tablet TAKE 1 TABLET(25 MG) BY MOUTH DAILY  . metFORMIN (GLUCOPHAGE) 1000 MG tablet TAKE 1 TABLET BY MOUTH TWICE DAILY  . NOVOFINE 32G X 6 MM MISC USE AS DIRECTED  . ONE TOUCH ULTRA TEST test strip TEST SUGAR DAILY  . ONETOUCH DELICA LANCETS 95M MISC USE AS DIRECTED  . pantoprazole (PROTONIX) 40 MG tablet TAKE 1 TABLET(40 MG) BY MOUTH DAILY  . VICTOZA 18 MG/3ML SOPN Inject 0.12 mg into the skin daily.   . [DISCONTINUED] canagliflozin (INVOKANA) 100 MG TABS tablet Take 1 tablet (100 mg total) by mouth daily before breakfast.   . Zoster Vac Recomb Adjuvanted [facility]) injection Inject 0.5 mLs into the muscle once.  . [DISCONTINUED] pioglitazone (ACTOS) 15 MG tablet Take 1 tablet (15 mg total) by mouth daily.  . [DISCONTINUED] 0.9 %  sodium chloride infusion    No facility-administered encounter medications on file as of 08/25/2017.     Allergies (verified) Patient has no known allergies.   History: Past Medical History:  Diagnosis Date  . Adenomatous colon polyp 04/2006; 2018   2007 Dr. Lajoyce Corners; 2018 Dr. Stefano Gaul 04/2022.  Marland Kitchen Chronic renal insufficiency, stage II (mild) 2017   CrCl 60s  . Diabetes mellitus   . Diverticulosis of colon    hospitalized for diverticulitis in remote past; also had episode 08/27/14  . Fatty liver 2015   Noted on noncontrast CT done for eval for kidney stones  . History of ETT    LOW RISK/NEG ETT 02/2011 w/Dr. Doreatha Lew  . History of hemorrhoids   . Hyperlipidemia    Crestor caused leg cramps  . Hypertension   . Nephrolithiasis    Saw urologist in [place] past ([facility]). Left mid ureteral stone 01/2016--ED visit.  . Renal cyst, left    hemorrhagic vs proteinacious (last f/u CT 2007 showed stability)--Dr. Lajoyce Corners.  . Shoulder pain, right     Injection on 2 consec mo at [place] (Dr. Marlou Sa) 2012   Past Surgical History:  Procedure Laterality Date  . CHALAZION EXCISION  summer 2015  .  COLONOSCOPY W/ POLYPECTOMY  04/2006; 04/2017   2007: Adenomatous polyps x 2; recall 5 yrs (Dr. Lajoyce Corners).  Repeat 04/2017 (Dr. Philomena Doheny adenoma x1 + sigmoid diverticulosis).  Recall 5 yrs.  . ESOPHAGOGASTRODUODENOSCOPY  2008   Esophagitis/gastritis/GERD.  H pylori and Barrett's NEG.  Marland Kitchen HEMORRHOID SURGERY    . NOSE SURGERY     deviated septum   Family History  Problem Relation Age of Onset  . Diabetes Mother   . Hypertension Mother   . Coronary artery disease Mother   . Diabetes Father   . Hypertension Father   . Coronary artery disease Father   . Lymphoma Father        non-hodgkin  .  Obesity Daughter   . Cancer Paternal Grandmother        unknown   Social History   Occupational History  . Not on file.   Social History Main Topics  . Smoking status: Never Smoker  . Smokeless tobacco: Never Used  . Alcohol use No  . Drug use: No  . Sexual activity: Yes    Partners: Female   Tobacco Counseling Counseling given: Not Answered   Activities of Daily Living In your present state of health, do you have any difficulty performing the following activities: 08/25/2017  Hearing? N  Vision? N  Difficulty concentrating or making decisions? N  Walking or climbing stairs? N  Dressing or bathing? N  Doing errands, shopping? N  Preparing Food and eating ? N  Using the Toilet? N  In the past six months, have you accidently leaked urine? N  Do you have problems with loss of bowel control? N  Managing your Medications? N  Managing your Finances? N  Housekeeping or managing your Housekeeping? N  Some recent data might be hidden    Immunizations and Health Maintenance Immunization History  Administered Date(s) Administered  . Hepatitis A, Adult 08/11/2014  . Influenza Whole 10/19/2010, 10/04/2011  . Influenza, High Dose Seasonal PF 10/24/2016, 08/25/2017  . Influenza,inj,Quad PF,6+ Mos 08/11/2014, 09/30/2015  . Pneumococcal Conjugate-13 08/25/2017  . Pneumococcal Polysaccharide-23 07/27/2012  . Tdap 07/27/2012  . Zoster 11/13/2011   Health Maintenance Due  Topic Date Due  . Hepatitis C Screening  [birth year]  . OPHTHALMOLOGY EXAM  02/26/2016    Patient Care Team: Tammi Sou, MD as PCP - General (Family Medicine) Milus Banister, MD as Consulting Physician (Gastroenterology)  Indicate any recent Medical Services you may have received from other than [facility] providers in the past year (date may be approximate).    Assessment:   This is a routine wellness examination for [place]. Physical assessment deferred to PCP.   Hearing/Vision screen Hearing  Screening Comments: Able to hear conversational tones w/o difficulty. No issues reported.   Vision Screening Comments: Last exam > 1 year. Will make appt.   Dietary issues and exercise activities discussed: Current Exercise Habits: The patient has a physically strenous job, but has no regular exercise apart from work., Exercise limited by: None identified   Diet (meal preparation, eat out, water intake, caffeinated beverages, dairy products, fruits and vegetables): Drinks water, gatorade and half/half tea.   Breakfast: skips, occasional breakfast sandwich Lunch: salad with protein  Dinner: protein and vegetables.      Goals    None     Depression Screen PHQ 2/9 Scores 08/25/2017 10/24/2016  PHQ - 2 Score 0 0    Fall Risk Fall Risk  08/25/2017 10/24/2016  Falls in the past year? No No  Cognitive Function:       Ad8 score reviewed for issues:  Issues making decisions: no  Less interest in hobbies / activities: no  Repeats questions, stories (family complaining): no  Trouble using ordinary gadgets (microwave, computer, phone): no  Forgets the month or year: no  Mismanaging finances: no  Remembering appts: no  Daily problems with thinking and/or memory: no Ad8 score is=0     Screening Tests Health Maintenance  Topic Date Due  . Hepatitis C Screening  [birth year]  . OPHTHALMOLOGY EXAM  02/26/2016  . HEMOGLOBIN A1C  11/23/2017  . FOOT EXAM  02/21/2018  . URINE MICROALBUMIN  02/21/2018  . PNA vac Low Risk Adult (2 of 2 - PPSV23) 08/25/2018  . COLONOSCOPY  04/25/2022  . TETANUS/TDAP  07/27/2022  . INFLUENZA VACCINE  Completed        Plan:    Schedule eye exam.   Shingles vaccine at pharmacy.  Bring a copy of your living will and/or healthcare power of attorney to your next office visit.  Continue doing brain stimulating activities (puzzles, reading, adult coloring books, staying active) to keep memory sharp.   I have personally reviewed and noted the  following in the patient's chart:   . Medical and social history . Use of alcohol, tobacco or illicit drugs  . Current medications and supplements . Functional ability and status . Nutritional status . Physical activity . Advanced directives . List of other physicians . Hospitalizations, surgeries, and ER visits in previous 12 months . Vitals . Screenings to include cognitive, depression, and falls . Referrals and appointments  In addition, I have reviewed and discussed with patient certain preventive protocols, quality metrics, and best practice recommendations. A written personalized care plan for preventive services as well as general preventive health recommendations were provided to patient.     Gerilyn Nestle, RN   08/25/2017

## 2017-08-25 NOTE — Patient Instructions (Addendum)
Schedule eye exam.   Shingles vaccine at pharmacy.   Bring a copy of your living will and/or healthcare power of attorney to your next office visit.  Continue doing brain stimulating activities (puzzles, reading, adult coloring books, staying active) to keep memory sharp.     Health Maintenance, Male A healthy lifestyle and preventive care is important for your health and wellness. Ask your health care provider about what schedule of regular examinations is right for you. What should I know about weight and diet? Eat a Healthy Diet  Eat plenty of vegetables, fruits, whole grains, low-fat dairy products, and lean protein.  Do not eat a lot of foods high in solid fats, added sugars, or salt.  Maintain a Healthy Weight Regular exercise can help you achieve or maintain a healthy weight. You should:  Do at least 150 minutes of exercise each week. The exercise should increase your heart rate and make you sweat (moderate-intensity exercise).  Do strength-training exercises at least twice a week.  Watch Your Levels of Cholesterol and Blood Lipids  Have your blood tested for lipids and cholesterol every 5 years starting at 66 years of age. If you are at high risk for heart disease, you should start having your blood tested when you are 66 years old. You may need to have your cholesterol levels checked more often if: ? Your lipid or cholesterol levels are high. ? You are older than 66 years of age. ? You are at high risk for heart disease.  What should I know about cancer screening? Many types of cancers can be detected early and may often be prevented. Lung Cancer  You should be screened every year for lung cancer if: ? You are a current smoker who has smoked for at least 30 years. ? You are a former smoker who has quit within the past 15 years.  Talk to your health care provider about your screening options, when you should start screening, and how often you should be  screened.  Colorectal Cancer  Routine colorectal cancer screening usually begins at 66 years of age and should be repeated every 5-10 years until you are 66 years old. You may need to be screened more often if early forms of precancerous polyps or small growths are found. Your health care provider may recommend screening at an earlier age if you have risk factors for colon cancer.  Your health care provider may recommend using home test kits to check for hidden blood in the stool.  A small camera at the end of a tube can be used to examine your colon (sigmoidoscopy or colonoscopy). This checks for the earliest forms of colorectal cancer.  Prostate and Testicular Cancer  Depending on your age and overall health, your health care provider may do certain tests to screen for prostate and testicular cancer.  Talk to your health care provider about any symptoms or concerns you have about testicular or prostate cancer.  Skin Cancer  Check your skin from head to toe regularly.  Tell your health care provider about any new moles or changes in moles, especially if: ? There is a change in a mole's size, shape, or color. ? You have a mole that is larger than a pencil eraser.  Always use sunscreen. Apply sunscreen liberally and repeat throughout the day.  Protect yourself by wearing long sleeves, pants, a wide-brimmed hat, and sunglasses when outside.  What should I know about heart disease, diabetes, and high blood pressure?  If   are 38-34 years of age, have your blood pressure checked every 3-5 years. If you are 52 years of age or older, have your blood pressure checked every year. You should have your blood pressure measured twice-once when you are at a hospital or clinic, and once when you are not at a hospital or clinic. Record the average of the two measurements. To check your blood pressure when you are not at a hospital or clinic, you can use: ? An automated blood pressure machine at a  pharmacy. ? A home blood pressure monitor.  Talk to your health care provider about your target blood pressure.  If you are between 81-94 years old, ask your health care provider if you should take aspirin to prevent heart disease.  Have regular diabetes screenings by checking your fasting blood sugar level. ? If you are at a normal weight and have a low risk for diabetes, have this test once every three years after the age of 44. ? If you are overweight and have a high risk for diabetes, consider being tested at a younger age or more often.  A one-time screening for abdominal aortic aneurysm (AAA) by ultrasound is recommended for men aged 47-75 years who are current or former smokers. What should I know about preventing infection? Hepatitis B If you have a higher risk for hepatitis B, you should be screened for this virus. Talk with your health care provider to find out if you are at risk for hepatitis B infection. Hepatitis C Blood testing is recommended for:  Everyone born from [birth year] through 1965.  Anyone with known risk factors for hepatitis C.  Sexually Transmitted Diseases (STDs)  You should be screened each year for STDs including gonorrhea and chlamydia if: ? You are sexually active and are younger than 66 years of age. ? You are older than 66 years of age and your health care provider tells you that you are at risk for this type of infection. ? Your sexual activity has changed since you were last screened and you are at an increased risk for chlamydia or gonorrhea. Ask your health care provider if you are at risk.  Talk with your health care provider about whether you are at high risk of being infected with HIV. Your health care provider may recommend a prescription medicine to help prevent HIV infection.  What else can I do?  Schedule regular health, dental, and eye exams.  Stay current with your vaccines (immunizations).  Do not use any tobacco products, such as  cigarettes, chewing tobacco, and e-cigarettes. If you need help quitting, ask your health care provider.  Limit alcohol intake to no more than 2 drinks per day. One drink equals 12 ounces of beer, 5 ounces of wine, or 1 ounces of hard liquor.  Do not use street drugs.  Do not share needles.  Ask your health care provider for help if you need support or information about quitting drugs.  Tell your health care provider if you often feel depressed.  Tell your health care provider if you have ever been abused or do not feel safe at home. This information is not intended to replace advice given to you by your health care provider. Make sure you discuss any questions you have with your health care provider. Document Released: 06/02/2008 Document Revised: 08/03/2016 Document Reviewed: 09/08/2015 Elsevier Interactive Patient Education  [employer].

## 2017-08-25 NOTE — Progress Notes (Deleted)
Office Note 08/25/2017  CC: No chief complaint on file.   HPI:  Shaun Knight is a 66 y.o. White male who is here for annual health maintenance exam.  DM 2: his last A1c was improved but still too high.  I added pioglitazone 15mg  daily at that time.   Past Medical History:  Diagnosis Date  . Adenomatous colon polyp 04/2006; 2018   2007 Dr. Lajoyce Corners; 2018 Dr. Stefano Gaul 04/2022.  Marland Kitchen Chronic renal insufficiency, stage II (mild) 2017   CrCl 60s  . Diabetes mellitus   . Diverticulosis of colon    hospitalized for diverticulitis in remote past; also had episode 08/27/14  . Fatty liver 2015   Noted on noncontrast CT done for eval for kidney stones  . History of ETT    LOW RISK/NEG ETT 02/2011 w/Dr. Doreatha Lew  . History of hemorrhoids   . Hyperlipidemia    Crestor caused leg cramps  . Hypertension   . Nephrolithiasis    Saw urologist in [place] past (?[facility]). Left mid ureteral stone 01/2016--ED visit.  . Renal cyst, left    hemorrhagic vs proteinacious (last f/u CT 2007 showed stability)--Dr. Lajoyce Corners.  . Shoulder pain, right     Injection on 2 consec mo at [facility] (Dr. Marlou Sa) 2012    Past Surgical History:  Procedure Laterality Date  . CHALAZION EXCISION  summer 2015  . COLONOSCOPY W/ POLYPECTOMY  04/2006; 04/2017   2007: Adenomatous polyps x 2; recall 5 yrs (Dr. Lajoyce Corners).  Repeat 04/2017 (Dr. Philomena Doheny adenoma x1 + sigmoid diverticulosis).  Recall 5 yrs.  . ESOPHAGOGASTRODUODENOSCOPY  2008   Esophagitis/gastritis/GERD.  H pylori and Barrett's NEG.  Marland Kitchen HEMORRHOID SURGERY    . NOSE SURGERY     deviated septum    Family History  Problem Relation Age of Onset  . Diabetes Mother   . Hypertension Mother   . Coronary artery disease Mother   . Diabetes Father   . Hypertension Father   . Coronary artery disease Father   . Lymphoma Father        non-hodgkin  . Obesity Daughter   . Cancer Paternal Grandmother        unknown    Social History   Social History  . Marital  status: Married    Spouse name: N/A  . Number of children: N/A  . Years of education: N/A   Occupational History  . Not on file.   Social History Main Topics  . Smoking status: Never Smoker  . Smokeless tobacco: Never Used  . Alcohol use No  . Drug use: No  . Sexual activity: Yes    Partners: Female   Other Topics Concern  . Not on file   Social History Narrative  . No narrative on file    Outpatient Medications Prior to Visit  Medication Sig Dispense Refill  . atorvastatin (LIPITOR) 80 MG tablet Take 1 tablet (80 mg total) by mouth daily. 90 tablet 3  . canagliflozin (INVOKANA) 100 MG TABS tablet Take 1 tablet (100 mg total) by mouth daily before breakfast. 90 tablet 1  . cetirizine (ZYRTEC) 10 MG tablet Take 10 mg by mouth daily.     . Glucose Blood (FREESTYLE LITE TEST VI)     . hydrochlorothiazide (HYDRODIURIL) 25 MG tablet TAKE 1 TABLET(25 MG) BY MOUTH DAILY 90 tablet 3  . metFORMIN (GLUCOPHAGE) 1000 MG tablet TAKE 1 TABLET BY MOUTH TWICE DAILY 180 tablet 0  . NOVOFINE 32G X 6 MM MISC USE AS  DIRECTED 100 each 0  . ONE TOUCH ULTRA TEST test strip TEST SUGAR DAILY 100 each 11  . ONETOUCH DELICA LANCETS 80H MISC USE AS DIRECTED 100 each 11  . pantoprazole (PROTONIX) 40 MG tablet TAKE 1 TABLET(40 MG) BY MOUTH DAILY 90 tablet 3  . pioglitazone (ACTOS) 15 MG tablet Take 1 tablet (15 mg total) by mouth daily. 30 tablet 2  . VICTOZA 18 MG/3ML SOPN Inject 0.12 mg into the skin daily.      Facility-Administered Medications Prior to Visit  Medication Dose Route Frequency Provider Last Rate Last Dose  . 0.9 %  sodium chloride infusion  500 mL Intravenous Continuous Milus Banister, MD        No Known Allergies  ROS *** PE; There were no vitals taken for this visit. *** Pertinent labs:  Lab Results  Component Value Date   TSH 1.11 07/11/2016   Lab Results  Component Value Date   WBC 6.7 07/11/2016   HGB 16.0 07/11/2016   HCT 48.0 07/11/2016   MCV 91.3 07/11/2016    PLT 196.0 07/11/2016   Lab Results  Component Value Date   CREATININE 1.09 02/21/2017   BUN 23 02/21/2017   NA 138 02/21/2017   K 4.0 02/21/2017   CL 98 02/21/2017   CO2 32 02/21/2017   Lab Results  Component Value Date   ALT 27 07/11/2016   AST 20 07/11/2016   ALKPHOS 58 07/11/2016   BILITOT 0.7 07/11/2016   Lab Results  Component Value Date   CHOL 183 05/24/2017   Lab Results  Component Value Date   HDL 36.50 (L) 05/24/2017   Lab Results  Component Value Date   LDLCALC 99 11/26/2012   Lab Results  Component Value Date   TRIG 257.0 (H) 05/24/2017   Lab Results  Component Value Date   CHOLHDL 5 05/24/2017   Lab Results  Component Value Date   PSA 0.51 07/11/2016   PSA 0.29 02/08/2011   Lab Results  Component Value Date   HGBA1C 8.7 (H) 05/24/2017    ASSESSMENT AND PLAN:   Health maintenance exam: Reviewed age and gender appropriate health maintenance issues (prudent diet, regular exercise, health risks of tobacco and excessive alcohol, use of seatbelts, fire alarms in home, use of sunscreen).  Also reviewed age and gender appropriate health screening as well as vaccine recommendations. Vaccines: Prevnar 13 and flu shots due. Labs: fasting HP today + HbA1c (DM 2). Prostate ca screening: DRE , PSA today. Colon ca screening/hx of adenomatous colon polyp: next colonoscopy due after 04/2022.  An After Visit Summary was printed and given to the patient.  FOLLOW UP:  No Follow-up on file.  Signed:  Crissie Sickles, MD           08/25/2017

## 2017-08-25 NOTE — Progress Notes (Signed)
AWV reviewed and agree.  Signed:  Crissie Sickles, MD           08/25/2017

## 2017-08-25 NOTE — Progress Notes (Signed)
Office Note 08/25/2017  CC:  Chief Complaint  Patient presents with  . Follow-up    [facility]CI, pt is fasting.   . Medicare Wellness    HPI:  Shaun Knight is a 66 y.o. White male who is here for annual health maintenance exam. A1c better last visit but still to high so I added Actos 15mg  qd.  He did not get this b/c of high price. Not taking invokana b/c of price. Says he feels well, no blurry vision.  No excessive thirst or urination. He doesn't check his glucoses.  Drinks a lot of water and gatorade.     Past Medical History:  Diagnosis Date  . Adenomatous colon polyp 04/2006; 2018   2007 Dr. Lajoyce Corners; 2018 Dr. Stefano Gaul 04/2022.  Marland Kitchen Chronic renal insufficiency, stage II (mild) 2017   CrCl 60s  . Diabetes mellitus   . Diverticulosis of colon    hospitalized for diverticulitis in remote past; also had episode 08/27/14  . Fatty liver 2015   Noted on noncontrast CT done for eval for kidney stones  . History of ETT    LOW RISK/NEG ETT 02/2011 w/Dr. Doreatha Lew  . History of hemorrhoids   . Hyperlipidemia    Crestor caused leg cramps  . Hypertension   . Nephrolithiasis    Saw urologist in [place] past (?[facility]). Left mid ureteral stone 01/2016--ED visit.  . Renal cyst, left    hemorrhagic vs proteinacious (last f/u CT 2007 showed stability)--Dr. Lajoyce Corners.  . Shoulder pain, right     Injection on 2 consec mo at [facility] (Dr. Marlou Sa) 2012    Past Surgical History:  Procedure Laterality Date  . CHALAZION EXCISION  summer 2015  . COLONOSCOPY W/ POLYPECTOMY  04/2006; 04/2017   2007: Adenomatous polyps x 2; recall 5 yrs (Dr. Lajoyce Corners).  Repeat 04/2017 (Dr. Philomena Doheny adenoma x1 + sigmoid diverticulosis).  Recall 5 yrs.  . ESOPHAGOGASTRODUODENOSCOPY  2008   Esophagitis/gastritis/GERD.  H pylori and Barrett's NEG.  Marland Kitchen HEMORRHOID SURGERY    . NOSE SURGERY     deviated septum    Family History  Problem Relation Age of Onset  . Diabetes Mother   . Hypertension Mother   . Coronary artery  disease Mother   . Diabetes Father   . Hypertension Father   . Coronary artery disease Father   . Lymphoma Father        non-hodgkin  . Obesity Daughter   . Cancer Paternal Grandmother        unknown    Social History   Social History  . Marital status: Married    Spouse name: N/A  . Number of children: N/A  . Years of education: N/A   Occupational History  . Not on file.   Social History Main Topics  . Smoking status: Never Smoker  . Smokeless tobacco: Never Used  . Alcohol use No  . Drug use: No  . Sexual activity: Yes    Partners: Female   Other Topics Concern  . Not on file   Social History Narrative   Married, 1 son and 1 daughter.   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   No T/A/Ds.    Outpatient Medications Prior to Visit  Medication Sig Dispense Refill  . atorvastatin (LIPITOR) 80 MG tablet Take 1 tablet (80 mg total) by mouth daily. 90 tablet 3  . cetirizine (ZYRTEC) 10 MG tablet Take 10 mg by mouth daily.     . Glucose Blood (  FREESTYLE LITE TEST VI)     . hydrochlorothiazide (HYDRODIURIL) 25 MG tablet TAKE 1 TABLET(25 MG) BY MOUTH DAILY 90 tablet 3  . metFORMIN (GLUCOPHAGE) 1000 MG tablet TAKE 1 TABLET BY MOUTH TWICE DAILY 180 tablet 0  . NOVOFINE 32G X 6 MM MISC USE AS DIRECTED 100 each 0  . ONE TOUCH ULTRA TEST test strip TEST SUGAR DAILY 100 each 11  . ONETOUCH DELICA LANCETS 52W MISC USE AS DIRECTED 100 each 11  . pantoprazole (PROTONIX) 40 MG tablet TAKE 1 TABLET(40 MG) BY MOUTH DAILY 90 tablet 3  . VICTOZA 18 MG/3ML SOPN Inject 0.12 mg into the skin daily.     . canagliflozin (INVOKANA) 100 MG TABS tablet Take 1 tablet (100 mg total) by mouth daily before breakfast. 90 tablet 1  . pioglitazone (ACTOS) 15 MG tablet Take 1 tablet (15 mg total) by mouth daily. 30 tablet 2  . 0.9 %  sodium chloride infusion      No facility-administered medications prior to visit.     No Known  Allergies  ROS Review of Systems  Constitutional: Negative for appetite change, chills, fatigue and fever.  HENT: Negative for congestion, dental problem, ear pain and sore throat.   Eyes: Negative for discharge, redness and visual disturbance.  Respiratory: Negative for cough, chest tightness, shortness of breath and wheezing.   Cardiovascular: Negative for chest pain, palpitations and leg swelling.  Gastrointestinal: Negative for abdominal pain, blood in stool, diarrhea, nausea and vomiting.  Genitourinary: Negative for difficulty urinating, dysuria, flank pain, frequency, hematuria and urgency.  Musculoskeletal: Negative for arthralgias, back pain, joint swelling, myalgias and neck stiffness.  Skin: Negative for pallor and rash.  Neurological: Negative for dizziness, speech difficulty, weakness and headaches.  Hematological: Negative for adenopathy. Does not bruise/bleed easily.  Psychiatric/Behavioral: Negative for confusion and sleep disturbance. The patient is not nervous/anxious.     PE; Blood pressure 117/77, pulse 76, temperature 98.4 F (36.9 C), temperature source Oral, resp. rate 16, height 5' 7.5" (1.715 m), weight 209 lb 4 oz (94.9 kg), SpO2 95 %. Gen: Alert, well appearing.  Patient is oriented to person, place, time, and situation. AFFECT: pleasant, lucid thought and speech. ENT: Ears: EACs clear, normal epithelium.  TMs with good light reflex and landmarks bilaterally.  Eyes: no injection, icteris, swelling, or exudate.  EOMI, PERRLA. Nose: no drainage or turbinate edema/swelling.  No injection or focal lesion.  Mouth: lips without lesion/swelling.  Oral mucosa pink and moist.  Dentition intact and without obvious caries or gingival swelling.  Oropharynx without erythema, exudate, or swelling.  Neck: supple/nontender.  No LAD, mass, or TM.  Carotid pulses 2+ bilaterally, without bruits. CV: RRR, no m/r/g.   LUNGS: CTA bilat, nonlabored resps, good aeration in all lung  fields. ABD: soft, NT, ND, BS normal.  No hepatospenomegaly or mass.  No bruits. EXT: no clubbing, cyanosis, or edema.  Musculoskeletal: no joint swelling, erythema, warmth, or tenderness.  ROM of all joints intact. Skin - no sores or suspicious lesions or rashes or color changes   Pertinent labs:  Lab Results  Component Value Date   TSH 1.11 07/11/2016   Lab Results  Component Value Date   WBC 6.7 07/11/2016   HGB 16.0 07/11/2016   HCT 48.0 07/11/2016   MCV 91.3 07/11/2016   PLT 196.0 07/11/2016   Lab Results  Component Value Date   CREATININE 1.09 02/21/2017   BUN 23 02/21/2017   NA 138 02/21/2017   K 4.0  02/21/2017   CL 98 02/21/2017   CO2 32 02/21/2017   Lab Results  Component Value Date   ALT 27 07/11/2016   AST 20 07/11/2016   ALKPHOS 58 07/11/2016   BILITOT 0.7 07/11/2016   Lab Results  Component Value Date   CHOL 183 05/24/2017   Lab Results  Component Value Date   HDL 36.50 (L) 05/24/2017   Lab Results  Component Value Date   LDLCALC 99 11/26/2012   Lab Results  Component Value Date   TRIG 257.0 (H) 05/24/2017   Lab Results  Component Value Date   CHOLHDL 5 05/24/2017   Lab Results  Component Value Date   PSA 0.51 07/11/2016   PSA 0.29 02/08/2011   Lab Results  Component Value Date   HGBA1C 8.7 (H) 05/24/2017     ASSESSMENT AND PLAN:   Health maintenance exam: Reviewed age and gender appropriate health maintenance issues (prudent diet, regular exercise, health risks of tobacco and excessive alcohol, use of seatbelts, fire alarms in home, use of sunscreen).  Also reviewed age and gender appropriate health screening as well as vaccine recommendations. Vaccines: prevnar 13 and flu today. LABS: fasting HP today + A1c (DM 2). Prostate ca screening: DRE--pt chose to defer this to next f/u visit ,PSA today. Colon ca screening/hx of adenomatous polyp: recall 04/2022.  An After Visit Summary was printed and given to the patient.  FOLLOW UP:   Return in about 3 months (around 11/24/2017) for routine chronic illness f/u.  Signed:  Crissie Sickles, MD           08/25/2017

## 2017-08-26 LAB — HEPATITIS C ANTIBODY
Hepatitis C Ab: NONREACTIVE
SIGNAL TO CUT-OFF: 0.01 (ref <1.00)

## 2017-08-28 ENCOUNTER — Other Ambulatory Visit: Payer: Self-pay | Admitting: Family Medicine

## 2017-08-28 MED ORDER — INSULIN NPH ISOPHANE & REGULAR (70-30) 100 UNIT/ML ~~LOC~~ SUSP: SUBCUTANEOUS | 3 refills | Status: DC

## 2017-08-29 ENCOUNTER — Other Ambulatory Visit: Payer: Self-pay | Admitting: *Deleted

## 2017-08-29 MED ORDER — INSULIN NPH ISOPHANE & REGULAR (70-30) 100 UNIT/ML ~~LOC~~ SUSP: SUBCUTANEOUS | 2 refills | Status: DC

## 2017-08-29 MED ORDER — "INSULIN SYRINGE-NEEDLE U-100 25G X 5/8"" 1 ML MISC": 6 refills | Status: DC

## 2017-08-29 NOTE — Addendum Note (Signed)
Addended by: Onalee Hua on: 08/29/2017 10:01 AM   Modules accepted: Orders

## 2017-08-29 NOTE — Telephone Encounter (Signed)
[facility] [place].  Received a fax from pharmacy stating that pts insurance does not cover Humulin 70/30 but will cover Novolin 70/30.   SW Dr. Anitra Lauth and was given v/o to change Rx to Novolin 70/30 with same sig/instructions.   Rx sent as directed.

## 2017-09-04 ENCOUNTER — Ambulatory Visit (INDEPENDENT_AMBULATORY_CARE_PROVIDER_SITE_OTHER): Payer: Medicare Other | Admitting: Family Medicine

## 2017-09-04 ENCOUNTER — Encounter: Payer: Self-pay | Admitting: Family Medicine

## 2017-09-04 VITALS — BP 120/77 | HR 73 | Temp 98.5°F | Resp 16 | Ht 67.5 in | Wt 207.5 lb

## 2017-09-04 DIAGNOSIS — B9789 Other viral agents as the cause of diseases classified elsewhere: Secondary | ICD-10-CM

## 2017-09-04 DIAGNOSIS — J069 Acute upper respiratory infection, unspecified: Secondary | ICD-10-CM | POA: Diagnosis not present

## 2017-09-04 MED ORDER — BENZONATATE 200 MG PO CAPS: 200.0000 mg | ORAL_CAPSULE | Freq: Three times a day (TID) | ORAL | 1 refills | Status: DC | PRN

## 2017-09-04 NOTE — Patient Instructions (Signed)
Get otc generic robitussin DM OR Mucinex DM and use as directed on the packaging for cough and congestion. Use otc generic saline nasal spray 2-3 times per day to irrigate/moisturize your nasal passages.   

## 2017-09-04 NOTE — Progress Notes (Signed)
OFFICE VISIT  09/04/2017   CC:  Chief Complaint  Patient presents with  . URI  . Cough   HPI:    Patient is a 66 y.o. Caucasian male who presents for respiratory symptoms. Onset 3 d/a cough, sneeze, runny nose, congested nose, feels like cough in chest.  No chest rattle, no wheezing. Subjective fever yesterday.  Took otc cold med a couple of times.  No SOB.  No ST or HA.  Past Medical History:  Diagnosis Date  . Adenomatous colon polyp 04/2006; 2018   2007 Dr. Lajoyce Corners; 2018 Dr. Stefano Gaul 04/2022.  Marland Kitchen Chronic renal insufficiency, stage II (mild) 2017   CrCl 60s  . Diabetes mellitus   . Diverticulosis of colon    hospitalized for diverticulitis in remote past; also had episode 08/27/14  . Fatty liver 2015   Noted on noncontrast CT done for eval for kidney stones  . History of ETT    LOW RISK/NEG ETT 02/2011 w/Dr. Doreatha Lew  . History of hemorrhoids   . Hyperlipidemia    Crestor caused leg cramps  . Hypertension   . Nephrolithiasis    Saw urologist in [place] past (?[facility]). Left mid ureteral stone 01/2016--ED visit.  . Renal cyst, left    hemorrhagic vs proteinacious (last f/u CT 2007 showed stability)--Dr. Lajoyce Corners.  . Shoulder pain, right     Injection on 2 consec mo at [place] (Dr. Marlou Sa) 2012    Past Surgical History:  Procedure Laterality Date  . CHALAZION EXCISION  summer 2015  . COLONOSCOPY W/ POLYPECTOMY  04/2006; 04/2017   2007: Adenomatous polyps x 2; recall 5 yrs (Dr. Lajoyce Corners).  Repeat 04/2017 (Dr. Philomena Doheny adenoma x1 + sigmoid diverticulosis).  Recall 5 yrs.  . ESOPHAGOGASTRODUODENOSCOPY  2008   Esophagitis/gastritis/GERD.  H pylori and Barrett's NEG.  Marland Kitchen HEMORRHOID SURGERY    . NOSE SURGERY     deviated septum    Outpatient Medications Prior to Visit  Medication Sig Dispense Refill  . atorvastatin (LIPITOR) 80 MG tablet Take 1 tablet (80 mg total) by mouth daily. 90 tablet 3  . cetirizine (ZYRTEC) 10 MG tablet Take 10 mg by mouth daily.     . Glucose  Blood (FREESTYLE LITE TEST VI)     . hydrochlorothiazide (HYDRODIURIL) 25 MG tablet TAKE 1 TABLET(25 MG) BY MOUTH DAILY 90 tablet 3  . metFORMIN (GLUCOPHAGE) 1000 MG tablet TAKE 1 TABLET BY MOUTH TWICE DAILY 180 tablet 0  . NOVOFINE 32G X 6 MM MISC USE AS DIRECTED 100 each 0  . ONE TOUCH ULTRA TEST test strip TEST SUGAR DAILY 100 each 11  . ONETOUCH DELICA LANCETS 16X MISC USE AS DIRECTED 100 each 11  . pantoprazole (PROTONIX) 40 MG tablet TAKE 1 TABLET(40 MG) BY MOUTH DAILY 90 tablet 3  . insulin NPH-regular Human (NOVOLIN 70/30) (70-30) 100 UNIT/ML injection Inject 30 units subcutaneously 30 minutes before breakfast and 15 units 30 minutes before supper. (Patient not taking: Reported on 09/04/2017) 13 mL 2  . Insulin Syringe-Needle U-100 25G X 5/8" 1 ML MISC Use to inject insulin twice a day (Patient not taking: Reported on 09/04/2017) 100 each 6   No facility-administered medications prior to visit.     No Known Allergies  ROS As per HPI  PE: Blood pressure 120/77, pulse 73, temperature 98.5 F (36.9 C), temperature source Oral, resp. rate 16, height 5' 7.5" (1.715 m), weight 207 lb 8 oz (94.1 kg), SpO2 94 %. VS: noted--normal. Gen: alert, NAD, NONTOXIC APPEARING. HEENT:  eyes without injection, drainage, or swelling.  Ears: EACs clear, TMs with normal light reflex and landmarks.  Nose: Clear rhinorrhea, with some dried, crusty exudate adherent to mildly injected mucosa.  No purulent d/c.  No paranasal sinus TTP.  No facial swelling.  Throat and mouth without focal lesion.  No pharyngial swelling, erythema, or exudate.   Neck: supple, no LAD.   LUNGS: CTA bilat, nonlabored resps.   CV: RRR, no m/r/g. EXT: no c/c/e SKIN: no rash  LABS:    Chemistry      Component Value Date/Time   NA 137 08/25/2017 1115   K 4.1 08/25/2017 1115   CL 95 (L) 08/25/2017 1115   CO2 32 08/25/2017 1115   BUN 16 08/25/2017 1115   CREATININE 1.10 08/25/2017 1115      Component Value Date/Time    CALCIUM 9.5 08/25/2017 1115   ALKPHOS 50 08/25/2017 1115   AST 16 08/25/2017 1115   ALT 18 08/25/2017 1115   BILITOT 0.9 08/25/2017 1115      IMPRESSION AND PLAN:  URI with cough. Doubt any significant component of bronchitis.  Certainly no RAD by hx of exam. Treat symptomatically. Tessalon perles 200 mg tid prn. Get otc generic robitussin DM OR Mucinex DM and use as directed on the packaging for cough and congestion. Use otc generic saline nasal spray 2-3 times per day to irrigate/moisturize your nasal passages.  An After Visit Summary was printed and given to the patient.  FOLLOW UP: Return if symptoms worsen or fail to improve.  Signed:  Crissie Sickles, MD           09/04/2017

## 2017-10-01 ENCOUNTER — Other Ambulatory Visit: Payer: Self-pay | Admitting: Family Medicine

## 2017-10-24 ENCOUNTER — Other Ambulatory Visit: Payer: Self-pay | Admitting: Family Medicine

## 2017-10-24 ENCOUNTER — Ambulatory Visit (INDEPENDENT_AMBULATORY_CARE_PROVIDER_SITE_OTHER): Payer: Medicare Other | Admitting: Family Medicine

## 2017-10-24 ENCOUNTER — Encounter: Payer: Self-pay | Admitting: Family Medicine

## 2017-10-24 VITALS — BP 120/72 | HR 75 | Temp 98.0°F | Resp 16 | Wt 206.0 lb

## 2017-10-24 DIAGNOSIS — R109 Unspecified abdominal pain: Secondary | ICD-10-CM

## 2017-10-24 LAB — CBC WITH DIFFERENTIAL/PLATELET
Basophils Absolute: 0 10*3/uL (ref 0.0–0.1)
Basophils Relative: 0.7 % (ref 0.0–3.0)
EOS PCT: 3.1 % (ref 0.0–5.0)
Eosinophils Absolute: 0.2 10*3/uL (ref 0.0–0.7)
HCT: 42.7 % (ref 39.0–52.0)
Hemoglobin: 14.4 g/dL (ref 13.0–17.0)
LYMPHS ABS: 1.2 10*3/uL (ref 0.7–4.0)
Lymphocytes Relative: 21.2 % (ref 12.0–46.0)
MCHC: 33.7 g/dL (ref 30.0–36.0)
MCV: 91.9 fl (ref 78.0–100.0)
MONO ABS: 0.3 10*3/uL (ref 0.1–1.0)
Monocytes Relative: 5.9 % (ref 3.0–12.0)
NEUTROS ABS: 4 10*3/uL (ref 1.4–7.7)
Neutrophils Relative %: 69.1 % (ref 43.0–77.0)
Platelets: 201 10*3/uL (ref 150.0–400.0)
RBC: 4.65 Mil/uL (ref 4.22–5.81)
RDW: 13.2 % (ref 11.5–15.5)
WBC: 5.8 10*3/uL (ref 4.0–10.5)

## 2017-10-24 LAB — POC URINALSYSI DIPSTICK (AUTOMATED)
BILIRUBIN UA: NEGATIVE
Blood, UA: NEGATIVE
GLUCOSE UA: NEGATIVE
KETONES UA: NEGATIVE
LEUKOCYTES UA: NEGATIVE
NITRITE UA: NEGATIVE
PH UA: 5 (ref 5.0–8.0)
Protein, UA: NEGATIVE
Spec Grav, UA: 1.015 (ref 1.010–1.025)
Urobilinogen, UA: 0.2 E.U./dL

## 2017-10-24 LAB — BASIC METABOLIC PANEL
BUN: 18 mg/dL (ref 6–23)
CO2: 32 mEq/L (ref 19–32)
Calcium: 9.2 mg/dL (ref 8.4–10.5)
Chloride: 97 mEq/L (ref 96–112)
Creatinine, Ser: 1.1 mg/dL (ref 0.40–1.50)
GFR: 71.13 mL/min (ref >60.00)
GLUCOSE: 174 mg/dL — AB (ref 70–99)
POTASSIUM: 3.7 meq/L (ref 3.5–5.1)
Sodium: 136 mEq/L (ref 135–145)

## 2017-10-24 MED ORDER — NAPROXEN 500 MG PO TABS: 500.0000 mg | ORAL_TABLET | Freq: Two times a day (BID) | ORAL | 0 refills | Status: DC

## 2017-10-24 MED ORDER — KETOROLAC TROMETHAMINE 60 MG/2ML IM SOLN
30.0000 mg | Freq: Once | INTRAMUSCULAR | Status: AC
  Administered 2017-10-24: 30 mg via INTRAMUSCULAR

## 2017-10-24 NOTE — Progress Notes (Signed)
Shaun Knight , 1951-10-13, 66 y.o., male MRN: [id number] Patient Care Team    Relationship Specialty Notifications Start End  McGowen, Adrian Blackwater, MD PCP - General Family Medicine  05/27/11   Milus Banister, MD Consulting Physician Gastroenterology  04/25/17     Chief Complaint  Patient presents with  . Back Pain    Right side flank pain x 3 days     Subjective: Pt presents for an OV with complaints of right-sided flank pain of 3 days duration.  Associated symptoms include nothing. Patient denies fever, chills, nausea or vomit. He is eating and drinking well. He reports normal urine output without change in stream. He is taking approximately 600 mg of Advil every 4 hours and this has relieved his discomfort. He reports history of multiple kidney stones last CT 2017 with left obstructed kidney stone. Patient reports he is always passed kidney stones on his own without any medication assistance.   Depression screen [facility] 2/9 08/25/2017 10/24/2016  Decreased Interest 0 0  Down, Depressed, Hopeless 0 0  PHQ - 2 Score 0 0    No Known Allergies Social History   Tobacco Use  . Smoking status: Never Smoker  . Smokeless tobacco: Never Used  Substance Use Topics  . Alcohol use: No   Past Medical History:  Diagnosis Date  . Adenomatous colon polyp 04/2006; 2018   2007 Dr. Lajoyce Corners; 2018 Dr. Stefano Gaul 04/2022.  Marland Kitchen Chronic renal insufficiency, stage II (mild) 2017   CrCl 60s  . Diabetes mellitus   . Diverticulosis of colon    hospitalized for diverticulitis in remote past; also had episode 08/27/14  . Fatty liver 2015   Noted on noncontrast CT done for eval for kidney stones  . History of ETT    LOW RISK/NEG ETT 02/2011 w/Dr. Doreatha Lew  . History of hemorrhoids   . Hyperlipidemia    Crestor caused leg cramps  . Hypertension   . Nephrolithiasis    Saw urologist in [place] past ([facility]). Left mid ureteral stone 01/2016--ED visit.  . Renal cyst, left    hemorrhagic vs proteinacious (last f/u CT  2007 showed stability)--Dr. Lajoyce Corners.  . Shoulder pain, right     Injection on 2 consec mo at [place] (Dr. Marlou Sa) 2012   Past Surgical History:  Procedure Laterality Date  . CHALAZION EXCISION  summer 2015  . COLONOSCOPY W/ POLYPECTOMY  04/2006; 04/2017   2007: Adenomatous polyps x 2; recall 5 yrs (Dr. Lajoyce Corners).  Repeat 04/2017 (Dr. Philomena Doheny adenoma x1 + sigmoid diverticulosis).  Recall 5 yrs.  . ESOPHAGOGASTRODUODENOSCOPY  2008   Esophagitis/gastritis/GERD.  H pylori and Barrett's NEG.  Marland Kitchen HEMORRHOID SURGERY    . NOSE SURGERY     deviated septum   Family History  Problem Relation Age of Onset  . Diabetes Mother   . Hypertension Mother   . Coronary artery disease Mother   . Diabetes Father   . Hypertension Father   . Coronary artery disease Father   . Lymphoma Father        non-hodgkin  . Obesity Daughter   . Cancer Paternal Grandmother        unknown   Allergies as of 10/24/2017   No Known Allergies     Medication List        Accurate as of 10/24/17  2:41 PM. Always use your most recent med list.          atorvastatin 80 MG tablet Commonly known as:  LIPITOR  Take 1 tablet (80 mg total) by mouth daily.   benzonatate 200 MG capsule Commonly known as:  TESSALON Take 1 capsule (200 mg total) by mouth 3 (three) times daily as needed for cough.   cetirizine 10 MG tablet Commonly known as:  ZYRTEC Take 10 mg by mouth daily.   FREESTYLE LITE TEST VI   ONE TOUCH ULTRA TEST test strip Generic drug:  glucose blood TEST SUGAR DAILY   hydrochlorothiazide 25 MG tablet Commonly known as:  HYDRODIURIL TAKE 1 TABLET(25 MG) BY MOUTH DAILY   insulin NPH-regular Human (70-30) 100 UNIT/ML injection Commonly known as:  NOVOLIN 70/30 Inject 30 units subcutaneously 30 minutes before breakfast and 15 units 30 minutes before supper.   Insulin Syringe-Needle U-100 25G X 5/8" 1 ML Misc Use to inject insulin twice a day   metFORMIN 1000 MG tablet Commonly known as:   GLUCOPHAGE TAKE 1 TABLET BY MOUTH TWICE DAILY   NOVOFINE 32G X 6 MM Misc Generic drug:  Insulin Pen Needle USE AS DIRECTED   ONETOUCH DELICA LANCETS 16X Misc USE AS DIRECTED   pantoprazole 40 MG tablet Commonly known as:  PROTONIX TAKE 1 TABLET(40 MG) BY MOUTH DAILY       All past medical history, surgical history, allergies, family history, immunizations andmedications were updated in the EMR today and reviewed under the history and medication portions of their EMR.     ROS: Negative, with the exception of above mentioned in HPI   Objective:  BP 120/72 (BP Location: Right Arm, Patient Position: Sitting, Cuff Size: Normal)   Pulse 75   Temp 98 F (36.7 C)   Resp 16   Wt 206 lb (93.4 kg)   SpO2 96%   BMI 31.79 kg/m  Body mass index is 31.79 kg/m. Gen: Afebrile. No acute distress. Nontoxic in appearance, well developed, well nourished. Very pleasant Caucasian male. HENT: AT. [place].  MMM, no oral lesions.  Eyes:Pupils Equal Round Reactive to light, Extraocular movements intact,  Conjunctiva without redness, discharge or icterus. CV: RRR, no edema Chest: CTAB, no wheeze or crackles.  Abd: Soft. Flat. NTND. BS present. No Masses palpated. No rebound or guarding.  MSK: No CVA tenderness bilaterally Neuro:  Normal gait. PERLA. EOMi. Alert. Oriented x3  No exam data present No results found. No results found for this or any previous visit (from the past 24 hour(s)).  Assessment/Plan: Shaun Knight is a 66 y.o. male present for OV for  Right flank pain - Patient seems to be comfortable currently. He reports taking a smaller dose of Advil approximately 6 hours ago. He does not believe he passed his kidney stone. He states he was rather uncomfortable yesterday but today he seems to be a little better. - Given vitals are stable, and he appears pretty comfortable. Will check kidney function and urinalysis. - Toradol injection provided for comfort today. Start naproxen tomorrow 500 mg  twice a day for 5-7 days for anti-inflammatory effect. - Flush kidneys with plenty of water, and drink lemonade or cranberry juice. - Basic Metabolic Panel (BMET) - CBC w/Diff - POCT Urinalysis Dipstick (Automated) - Urine Culture - ketorolac (TORADOL) injection 30 mg - Patient applies to fevers chills or worsening pain occur he is to be seen immediately. Otherwise follow-up with PCP in 1-2 weeks if not resolved. We'll hold on any imaging given he seems rather comfortable today.   Reviewed expectations re: course of current medical issues.  Discussed self-management of symptoms.  Outlined signs and symptoms  indicating need for more acute intervention.  Patient verbalized understanding and all questions were answered.  Patient received an After-Visit Summary.    No orders of the defined types were placed in this encounter.    Note is dictated utilizing voice recognition software. Although note has been proof read prior to signing, occasional typographical errors still can be missed. If any questions arise, please do not hesitate to call for verification.   electronically signed by:  Howard Pouch, DO  [place]

## 2017-10-24 NOTE — Patient Instructions (Signed)
toradol today for discomfort.  Naproxen start tomorrow every 12 hours with food for 5 days, then as needed for pain only.  No advil or aleve use with these medications.  Plenty of water and try lemonade.  If fever, chills, nausea vomit or increased pain please be seen immediately.    Kidney Stones Kidney stones (urolithiasis) are rock-like masses that form inside of the kidneys. Kidneys are organs that make pee (urine). A kidney stone can cause very bad pain and can block the flow of pee. The stone usually leaves your body (passes) through your pee. You may need to have a doctor take out the stone. Follow these instructions at home: Eating and drinking  Drink enough fluid to keep your pee clear or pale yellow. This will help you pass the stone.  If told by your doctor, change the foods you eat (your diet). This may include: ? Limiting how much salt (sodium) you eat. ? Eating more fruits and vegetables. ? Limiting how much meat, poultry, fish, and eggs you eat.  Follow instructions from your doctor about eating or drinking restrictions. General instructions  Collect pee samples as told by your doctor. You may need to collect a pee sample: ? 24 hours after a stone comes out. ? 8-12 weeks after a stone comes out, and every 6-12 months after that.  Strain your pee every time you pee (urinate), for as long as told. Use the strainer that your doctor recommends.  Do not throw out the stone. Keep it so that it can be tested by your doctor.  Take over-the-counter and prescription medicines only as told by your doctor.  Keep all follow-up visits as told by your doctor. This is important. You may need follow-up tests. Preventing kidney stones To prevent another kidney stone:  Drink enough fluid to keep your pee clear or pale yellow. This is the best way to prevent kidney stones.  Eat healthy foods.  Avoid certain foods as told by your doctor. You may be told to eat less protein.  Stay  at a healthy weight.  Contact a doctor if:  You have pain that gets worse or does not get better with medicine. Get help right away if:  You have a fever or chills.  You get very bad pain.  You get new pain in your belly (abdomen).  You pass out (faint).  You cannot pee. This information is not intended to replace advice given to you by your health care provider. Make sure you discuss any questions you have with your health care provider. Document Released: 05/23/2008 Document Revised: 08/23/2016 Document Reviewed: 08/23/2016 Elsevier Interactive Patient Education  2017 [employer].

## 2017-10-25 ENCOUNTER — Encounter: Payer: Self-pay | Admitting: Family Medicine

## 2017-10-26 LAB — URINE CULTURE
MICRO NUMBER: 81250377
Result:: NO GROWTH
SPECIMEN QUALITY: ADEQUATE

## 2017-10-29 ENCOUNTER — Other Ambulatory Visit: Payer: Self-pay | Admitting: Family Medicine

## 2017-11-23 ENCOUNTER — Ambulatory Visit (INDEPENDENT_AMBULATORY_CARE_PROVIDER_SITE_OTHER): Payer: Medicare Other

## 2017-11-23 ENCOUNTER — Encounter: Payer: Self-pay | Admitting: Podiatry

## 2017-11-23 ENCOUNTER — Ambulatory Visit (INDEPENDENT_AMBULATORY_CARE_PROVIDER_SITE_OTHER): Payer: Medicare Other | Admitting: Podiatry

## 2017-11-23 ENCOUNTER — Other Ambulatory Visit: Payer: Self-pay | Admitting: Podiatry

## 2017-11-23 DIAGNOSIS — M779 Enthesopathy, unspecified: Secondary | ICD-10-CM

## 2017-11-23 DIAGNOSIS — M79672 Pain in left foot: Secondary | ICD-10-CM | POA: Diagnosis not present

## 2017-11-23 DIAGNOSIS — M722 Plantar fascial fibromatosis: Secondary | ICD-10-CM

## 2017-11-23 MED ORDER — TRIAMCINOLONE ACETONIDE 10 MG/ML IJ SUSP
10.0000 mg | Freq: Once | INTRAMUSCULAR | Status: AC
  Administered 2017-11-23: 10 mg

## 2017-11-23 NOTE — Progress Notes (Signed)
Subjective:   Patient ID: Shaun Knight, male   DOB: 66 y.o.   MRN: [id number]   HPI Patient presents stating that his left foot has been very sore for the last several months and he does not remember specific injury but it is making it hard for him to walk.  Patient does not smoke and likes to be active   Review of Systems  All other systems reviewed and are negative.       Objective:  Physical Exam  Constitutional: He appears well-developed and well-nourished.  Cardiovascular: Intact distal pulses.  Pulmonary/Chest: Effort normal.  Musculoskeletal: Normal range of motion.  Neurological: He is alert.  Skin: Skin is warm.  Nursing note and vitals reviewed.   Neurovascular status intact muscle strength adequate range of motion within normal limits with patient noted to have acute inflammation pain around the posterior tibial insertion left with fluid buildup of the tendon itself.  Patient does have moderate depression of the arch was noted to have good digital perfusion and is well oriented x3.     Assessment:  Acute posterior tibial tendinitis left with inflammation and foot structure as contributing and complicating factor.     Plan:  H&P condition reviewed and at this point I did do a careful injection 3 mg dexamethasone Kenalog 5 mg Xylocaine advised on reduced activity and applied fascial brace with the arch.  Gave instructions on supportive shoes and not going barefoot and patient will be seen back if symptoms persist.  X-rays indicate there is moderate depression of the arch but no indications of acute pathology

## 2017-11-23 NOTE — Progress Notes (Signed)
   Subjective:    Patient ID: Shaun Knight, male    DOB: [birth year], 66 y.o.   MRN: [id number]  HPI    Review of Systems  All other systems reviewed and are negative.      Objective:   Physical Exam        Assessment & Plan:

## 2017-11-24 ENCOUNTER — Ambulatory Visit: Payer: Medicare Other | Admitting: Family Medicine

## 2017-11-29 ENCOUNTER — Ambulatory Visit: Payer: Self-pay | Admitting: Podiatry

## 2017-12-03 ENCOUNTER — Other Ambulatory Visit: Payer: Self-pay | Admitting: Family Medicine

## 2017-12-07 ENCOUNTER — Telehealth: Payer: Self-pay | Admitting: Family Medicine

## 2017-12-07 MED ORDER — LIRAGLUTIDE 18 MG/3ML ~~LOC~~ SOPN: 1.2000 mg | PEN_INJECTOR | Freq: Every day | SUBCUTANEOUS | 3 refills | Status: DC

## 2017-12-07 NOTE — Telephone Encounter (Signed)
Patient called in wanting a refill on Victoza, which is not showing up on his medication list. He says he has been taking that and not taking insulin. Explained to patient I could not do anything at this point, so I will notify the provider about the victoza, verbalized understanding.

## 2017-12-07 NOTE — Telephone Encounter (Signed)
OK, victoza eRx'd per pt request.

## 2017-12-07 NOTE — Telephone Encounter (Signed)
Patient advised me that his numbers have been in low 100's on 0.12 Victoza.  Patient has been out since 12.15.18 though.  He states he will not take insulin.  He is taking metformin currently and would like RF of victoza at 0.12.  Please advise.

## 2017-12-08 NOTE — Telephone Encounter (Signed)
Patient aware of RX at pharmacy.

## 2017-12-31 ENCOUNTER — Other Ambulatory Visit: Payer: Self-pay | Admitting: Family Medicine

## 2018-03-05 ENCOUNTER — Encounter: Payer: Self-pay | Admitting: Family Medicine

## 2018-03-05 ENCOUNTER — Ambulatory Visit (INDEPENDENT_AMBULATORY_CARE_PROVIDER_SITE_OTHER): Payer: Medicare Other | Admitting: Family Medicine

## 2018-03-05 VITALS — BP 130/79 | HR 78 | Temp 98.5°F | Resp 16 | Ht 67.5 in | Wt 206.2 lb

## 2018-03-05 DIAGNOSIS — E7800 Pure hypercholesterolemia, unspecified: Secondary | ICD-10-CM

## 2018-03-05 DIAGNOSIS — I10 Essential (primary) hypertension: Secondary | ICD-10-CM

## 2018-03-05 DIAGNOSIS — R252 Cramp and spasm: Secondary | ICD-10-CM | POA: Diagnosis not present

## 2018-03-05 DIAGNOSIS — E119 Type 2 diabetes mellitus without complications: Secondary | ICD-10-CM | POA: Diagnosis not present

## 2018-03-05 LAB — BASIC METABOLIC PANEL
BUN: 20 mg/dL (ref 6–23)
CHLORIDE: 95 meq/L — AB (ref 96–112)
CO2: 28 meq/L (ref 19–32)
Calcium: 9.3 mg/dL (ref 8.4–10.5)
Creatinine, Ser: 1.11 mg/dL (ref 0.40–1.50)
GFR: 70.31 mL/min (ref >60.00)
Glucose, Bld: 187 mg/dL — ABNORMAL HIGH (ref 70–99)
POTASSIUM: 3.9 meq/L (ref 3.5–5.1)
Sodium: 135 mEq/L (ref 135–145)

## 2018-03-05 LAB — MICROALBUMIN / CREATININE URINE RATIO
CREATININE, U: 112.5 mg/dL
MICROALB UR: 2.8 mg/dL — AB (ref 0.0–1.9)
Microalb Creat Ratio: 2.5 mg/g (ref 0.0–30.0)

## 2018-03-05 LAB — MAGNESIUM: MAGNESIUM: 1.5 mg/dL (ref 1.5–2.5)

## 2018-03-05 LAB — HEMOGLOBIN A1C: HEMOGLOBIN A1C: 9.3 % — AB (ref 4.6–6.5)

## 2018-03-05 NOTE — Patient Instructions (Signed)
Buy over the counter magnesium oxide pills 500 mg, take one tab every day.

## 2018-03-05 NOTE — Progress Notes (Signed)
OFFICE VISIT  03/05/2018   CC:  Chief Complaint  Patient presents with  . Follow-up    DM, pt is fasting.     HPI:    Patient is a 67 y.o. Caucasian male who presents for 6 mo f/u DM 2, HTN, HLD. Feeling.  DM: Last visit A1c up to 10.2%;  Using victoza 1.2, compliant with metformin and invokana daily. No home glucose monitoring.  Thinking clearly.   HTN: compliant with med.  No home monitoring.  HLD: taking atorv and denies side effects.  No exercise. Tries to eat pretty healthy diet.  ROS: no CP, no melena, no hematochezia, no palpitations, no dizziness, no LE swelling Has some intermittent bilat LL muscle cramping--random.  Past Medical History:  Diagnosis Date  . Adenomatous colon polyp 04/2006; 2018   2007 Dr. Lajoyce Corners; 2018 Dr. Stefano Gaul 04/2022.  Marland Kitchen Chronic renal insufficiency, stage II (mild) 2017   CrCl 60s  . Diabetes mellitus   . Diverticulosis of colon    hospitalized for diverticulitis in remote past; also had episode 08/27/14  . Fatty liver 2015   Noted on noncontrast CT done for eval for kidney stones  . History of ETT    LOW RISK/NEG ETT 02/2011 w/Dr. Doreatha Lew  . History of hemorrhoids   . Hyperlipidemia    Crestor caused leg cramps  . Hypertension   . Nephrolithiasis    Saw urologist in [place] past (?[facility]). Left mid ureteral stone 01/2016--ED visit.  . Renal cyst, left    hemorrhagic vs proteinacious (last f/u CT 2007 showed stability)--Dr. Lajoyce Corners.  . Shoulder pain, right     Injection on 2 consec mo at [place] (Dr. Marlou Sa) 2012    Past Surgical History:  Procedure Laterality Date  . CHALAZION EXCISION  summer 2015  . COLONOSCOPY W/ POLYPECTOMY  04/2006; 04/2017   2007: Adenomatous polyps x 2; recall 5 yrs (Dr. Lajoyce Corners).  Repeat 04/2017 (Dr. Philomena Doheny adenoma x1 + sigmoid diverticulosis).  Recall 5 yrs.  . ESOPHAGOGASTRODUODENOSCOPY  2008   Esophagitis/gastritis/GERD.  H pylori and Barrett's NEG.  Marland Kitchen HEMORRHOID SURGERY    . NOSE SURGERY     deviated septum    Outpatient Medications Prior to Visit  Medication Sig Dispense Refill  . atorvastatin (LIPITOR) 80 MG tablet Take 1 tablet (80 mg total) by mouth daily. 90 tablet 3  . canagliflozin (INVOKANA) 100 MG TABS tablet Take 100 mg by mouth daily before breakfast.    . cetirizine (ZYRTEC) 10 MG tablet Take 10 mg by mouth daily.     . Glucose Blood (FREESTYLE LITE TEST VI)     . hydrochlorothiazide (HYDRODIURIL) 25 MG tablet TAKE 1 TABLET(25 MG) BY MOUTH DAILY 90 tablet 3  . Insulin Syringe-Needle U-100 25G X 5/8" 1 ML MISC Use to inject insulin twice a day 100 each 6  . liraglutide (VICTOZA) 18 MG/3ML SOPN Inject 0.2 mLs (1.2 mg total) into the skin daily. 15 pen 3  . metFORMIN (GLUCOPHAGE) 1000 MG tablet TAKE 1 TABLET BY MOUTH TWICE DAILY 180 tablet 0  . NOVOFINE 32G X 6 MM MISC USE AS DIRECTED 100 each 0  . ONE TOUCH ULTRA TEST test strip TEST SUGAR DAILY 100 each 11  . ONETOUCH DELICA LANCETS 62B MISC USE AS DIRECTED 100 each 11  . pantoprazole (PROTONIX) 40 MG tablet TAKE 1 TABLET(40 MG) BY MOUTH DAILY 90 tablet 3  . benzonatate (TESSALON) 200 MG capsule Take 1 capsule (200 mg total) by mouth 3 (three) times daily  as needed for cough. (Patient not taking: Reported on 03/05/2018) 30 capsule 1  . naproxen (NAPROSYN) 500 MG tablet Take 1 tablet (500 mg total) 2 (two) times daily with a meal by mouth. (Patient not taking: Reported on 03/05/2018) 15 tablet 0   No facility-administered medications prior to visit.     No Known Allergies  ROS As per HPI   PE: Blood pressure 130/79, pulse 78, temperature 98.5 F (36.9 C), temperature source Oral, resp. rate 16, height 5' 7.5" (1.715 m), weight 206 lb 4 oz (93.6 kg), SpO2 96 %. Body mass index is 31.83 kg/m.  Gen: Alert, well appearing.  Patient is oriented to person, place, time, and situation. AFFECT: pleasant, lucid thought and speech. Foot exam - bilateral normal; no swelling, tenderness or skin or vascular lesions. Color  and temperature is normal. Sensation is intact. Peripheral pulses are palpable. Toenails are normal.   LABS:  Lab Results  Component Value Date   TSH 1.11 08/25/2017   Lab Results  Component Value Date   WBC 5.8 10/24/2017   HGB 14.4 10/24/2017   HCT 42.7 10/24/2017   MCV 91.9 10/24/2017   PLT 201.0 10/24/2017   Lab Results  Component Value Date   CREATININE 1.10 10/24/2017   BUN 18 10/24/2017   NA 136 10/24/2017   K 3.7 10/24/2017   CL 97 10/24/2017   CO2 32 10/24/2017   Lab Results  Component Value Date   ALT 18 08/25/2017   AST 16 08/25/2017   ALKPHOS 50 08/25/2017   BILITOT 0.9 08/25/2017   Lab Results  Component Value Date   CHOL 191 08/25/2017   Lab Results  Component Value Date   HDL 36.20 (L) 08/25/2017   Lab Results  Component Value Date   LDLCALC 99 11/26/2012   Lab Results  Component Value Date   TRIG 245.0 (H) 08/25/2017   Lab Results  Component Value Date   CHOLHDL 5 08/25/2017   Lab Results  Component Value Date   PSA 0.51 08/25/2017   PSA 0.51 07/11/2016   PSA 0.29 02/08/2011   Lab Results  Component Value Date   HGBA1C 10.2 (H) 08/25/2017    IMPRESSION AND PLAN:  1) DM 2:  Feeling well, but historically his control has been poor and he's not really been all that compliant with meds on a whole. HbA1c today. Feet exam normal today. Urine microalb/cr today. Pt states he is UTD on annual eye exam.  2) HTN: The current medical regimen is effective;  continue present plan and medications. BMET today.  3) HLD: tolerating high dose statin.  Last lipids 08/2017 fair. Plan recheck FLP next f/u 6 mo for CPE (this is the f/u schedule the pt strongly prefers).  4) Intermittent calf muscle cramps: check mag level. Take 500 mg mag ox qd. Checking lytes today. Encouraged good hydration practices.  An After Visit Summary was printed and given to the patient.   FOLLOW UP: Return in about 6 months (around 09/05/2018) for annual CPE  (fasting).  Signed:  Crissie Sickles, MD           03/05/2018

## 2018-03-06 ENCOUNTER — Other Ambulatory Visit: Payer: Self-pay | Admitting: Family Medicine

## 2018-03-18 ENCOUNTER — Other Ambulatory Visit: Payer: Self-pay | Admitting: Family Medicine

## 2018-03-30 ENCOUNTER — Other Ambulatory Visit: Payer: Self-pay | Admitting: Family Medicine

## 2018-04-19 ENCOUNTER — Encounter: Payer: Self-pay | Admitting: Podiatry

## 2018-04-19 ENCOUNTER — Other Ambulatory Visit: Payer: Self-pay | Admitting: Podiatry

## 2018-04-19 ENCOUNTER — Ambulatory Visit (INDEPENDENT_AMBULATORY_CARE_PROVIDER_SITE_OTHER): Payer: Medicare Other | Admitting: Podiatry

## 2018-04-19 ENCOUNTER — Ambulatory Visit (INDEPENDENT_AMBULATORY_CARE_PROVIDER_SITE_OTHER): Payer: Medicare Other

## 2018-04-19 DIAGNOSIS — M79671 Pain in right foot: Secondary | ICD-10-CM

## 2018-04-19 DIAGNOSIS — M779 Enthesopathy, unspecified: Secondary | ICD-10-CM

## 2018-04-19 DIAGNOSIS — M79672 Pain in left foot: Secondary | ICD-10-CM

## 2018-04-19 MED ORDER — TRIAMCINOLONE ACETONIDE 10 MG/ML IJ SUSP
10.0000 mg | Freq: Once | INTRAMUSCULAR | Status: AC
  Administered 2018-04-19: 10 mg

## 2018-04-19 NOTE — Progress Notes (Signed)
Patient didn't want f/o after he was told the cost and Medicare wouldn't pay.   However, I did give him an option of brining old pair in and getting refurb for 90. Liliane Channel

## 2018-04-19 NOTE — Progress Notes (Signed)
Subjective:   Patient ID: Shaun Knight, male   DOB: 67 y.o.   MRN: [id number]   HPI Patient presents stating he is getting a lot of pain on the inside of his left foot again it was better for couple months and is starting to bother him   ROS      Objective:  Physical Exam  Neurovascular status intact with inflammation pain around the posterior tibial insertion left with fluid buildup to moderate depression of the arch     Assessment:  Posterior tibial tendinitis left with structure of the foot is complicating factor     Plan:  H&P x-ray reviewed and condition discussed.  At this time we will redo the sheath injection have advised him on customized orthotics to lift up the plantar arch and we will go this route with him.  Long-term may require immobilization but does not get better may require MRI and other treatment which might be necessary patient will be seen back for [place] to recheck  X-ray indicates that there is no indications of increased bone pathology or stress fracture or anything along those lines

## 2018-04-25 ENCOUNTER — Encounter: Payer: Self-pay | Admitting: Family Medicine

## 2018-07-16 ENCOUNTER — Telehealth: Payer: Self-pay | Admitting: Podiatry

## 2018-07-16 NOTE — Telephone Encounter (Signed)
Pt called upset stating he keeps getting a bill from [facility] for orthotics and he never got them. He said when they told him the price of them he said no that he could get several new pair of shoes for the price of the orthotics. I don't see where we ordered them.

## 2018-07-27 ENCOUNTER — Other Ambulatory Visit: Payer: Self-pay | Admitting: Family Medicine

## 2018-07-30 ENCOUNTER — Encounter: Payer: Self-pay | Admitting: Family Medicine

## 2018-07-30 ENCOUNTER — Ambulatory Visit (INDEPENDENT_AMBULATORY_CARE_PROVIDER_SITE_OTHER): Payer: Medicare Other | Admitting: Family Medicine

## 2018-07-30 VITALS — BP 117/83 | HR 73 | Temp 98.3°F | Resp 20 | Ht 67.5 in | Wt 208.0 lb

## 2018-07-30 DIAGNOSIS — L989 Disorder of the skin and subcutaneous tissue, unspecified: Secondary | ICD-10-CM

## 2018-07-30 DIAGNOSIS — H00015 Hordeolum externum left lower eyelid: Secondary | ICD-10-CM | POA: Diagnosis not present

## 2018-07-30 MED ORDER — ERYTHROMYCIN 5 MG/GM OP OINT: 1.0000 "application " | TOPICAL_OINTMENT | Freq: Every day | OPHTHALMIC | 0 refills | Status: DC

## 2018-07-30 NOTE — Patient Instructions (Addendum)
Try Flonase nasal spray to help with the fluid behind the ears.  The small area in the ear, looks ok, put a little neosporin on the area until healed.

## 2018-07-30 NOTE — Progress Notes (Signed)
Shaun Knight , 08/08/1951, 67 y.o., male MRN: [id number] Patient Care Team    Relationship Specialty Notifications Start End  McGowen, Adrian Blackwater, MD PCP - General Family Medicine  05/27/11   Milus Banister, MD Consulting Physician Gastroenterology  04/25/17   Wallene Huh, DPM Consulting Physician Podiatry  11/29/17     Chief Complaint  Patient presents with  . Ear Pain    sore behind left ear and inside left ear     Subjective: Pt presents for an OV with complaints of  Left ear pain/Right ear fullness: Pt reports he has had left ear discomfort after having a small pimple that he popped. He also has a small up behind his ear. His right ear feels full also. No fever, chills, nausea or hearing loss. He has been putting neosporin on the "pimple."  Left eye stye: he has had a left stye of the lower lid for 2 weeks. He is using and OTC regimen that is not effective.    Depression screen [facility] 2/9 08/25/2017 10/24/2016  Decreased Interest 0 0  Down, Depressed, Hopeless 0 0  PHQ - 2 Score 0 0    No Known Allergies Social History   Tobacco Use  . Smoking status: Never Smoker  . Smokeless tobacco: Never Used  Substance Use Topics  . Alcohol use: No   Past Medical History:  Diagnosis Date  . Adenomatous colon polyp 04/2006; 2018   2007 Dr. Lajoyce Corners; 2018 Dr. Stefano Gaul 04/2022.  Marland Kitchen Chronic renal insufficiency, stage II (mild) 2017   CrCl 60s  . Diabetes mellitus   . Diverticulosis of colon    hospitalized for diverticulitis in remote past; also had episode 08/27/14  . Fatty liver 2015   Noted on noncontrast CT done for eval for kidney stones  . History of ETT    LOW RISK/NEG ETT 02/2011 w/Dr. Doreatha Lew  . History of hemorrhoids   . Hyperlipidemia    Crestor caused leg cramps  . Hypertension   . Left foot pain    medial: post tibial tendonitis---podiatrist did sheath injection 03/2018  . Nephrolithiasis    Saw [place]rologist in [place] past ([facility] Left mid ureteral stone 01/2016--ED  visit.  . Renal cyst, left    hemorrhagic vs proteinacious (last f/u CT 2007 showed stability)--Dr. Lajoyce Corners.  . Shoulder pain, right     Injection on 2 consec mo at [place] (Dr. Marlou Sa) 2012   Past Surgical History:  Procedure Laterality Date  . CHALAZION EXCISION  summer 2015  . COLONOSCOPY W/ POLYPECTOMY  04/2006; 04/2017   2007: Adenomatous polyps x 2; recall 5 yrs (Dr. Lajoyce Corners).  Repeat 04/2017 (Dr. Philomena Doheny adenoma x1 + sigmoid diverticulosis).  Recall 5 yrs.  . ESOPHAGOGASTRODUODENOSCOPY  2008   Esophagitis/gastritis/GERD.  H pylori and Barrett's NEG.  Marland Kitchen HEMORRHOID SURGERY    . NOSE SURGERY     deviated septum   Family History  Problem Relation Age of Onset  . Diabetes Mother   . Hypertension Mother   . Coronary artery disease Mother   . Diabetes Father   . Hypertension Father   . Coronary artery disease Father   . Lymphoma Father        non-hodgkin  . Obesity Daughter   . Cancer Paternal Grandmother        unknown   Allergies as of 07/30/2018   No Known Allergies     Medication List        Accurate as of 07/30/18  3:55 PM. Always use your most recent med list.          atorvastatin 80 MG tablet Commonly known as:  LIPITOR TAKE 1 TABLET BY MOUTH DAILY   cetirizine 10 MG tablet Commonly known as:  ZYRTEC Take 10 mg by mouth daily.   erythromycin ophthalmic ointment Place 1 application into the left eye at bedtime.   FREESTYLE LITE TEST VI   ONE TOUCH ULTRA TEST test strip Generic drug:  glucose blood TEST SUGAR DAILY   hydrochlorothiazide 25 MG tablet Commonly known as:  HYDRODIURIL TAKE 1 TABLET BY MOUTH DAILY   Insulin Syringe-Needle U-100 25G X 5/8" 1 ML Misc Use to inject insulin twice a day   INVOKANA 100 MG Tabs tablet Generic drug:  canagliflozin Take 100 mg by mouth daily before breakfast.   liraglutide 18 MG/3ML Sopn Commonly known as:  VICTOZA Inject 0.2 mLs (1.2 mg total) into the skin daily.   metFORMIN 1000 MG  tablet Commonly known as:  GLUCOPHAGE TAKE 1 TABLET BY MOUTH TWICE DAILY   NOVOFINE 32G X 6 MM Misc Generic drug:  Insulin Pen Needle USE AS DIRECTED   ONETOUCH DELICA LANCETS 03J Misc USE AS DIRECTED   pantoprazole 40 MG tablet Commonly known as:  PROTONIX TAKE 1 TABLET BY MOUTH DAILY       All past medical history, surgical history, allergies, family history, immunizations andmedications were updated in the EMR today and reviewed under the history and medication portions of their EMR.     ROS: Negative, with the exception of above mentioned in HPI   Objective:  BP 117/83 (BP Location: Left Arm, Patient Position: Sitting, Cuff Size: Large)   Pulse 73   Temp 98.3 F (36.8 C)   Resp 20   Ht 5' 7.5" (1.715 m)   Wt 208 lb (94.3 kg)   SpO2 96%   BMI 32.10 kg/m  Body mass index is 32.1 kg/m. Gen: Afebrile. No acute distress. Nontoxic in appearance, well developed, well nourished.  HENT: AT. [place]. Bilateral TM visualized without erythema or fullness. MMM, no oral lesions. Small 86mm excoriated comedone left EAM. No redness. No drainage. Small mobile cystic like mass left posterior auricular. No TTP. No redness or drainage.  Eyes:Pupils Equal Round Reactive to light, Extraocular movements intact,  Conjunctiva without redness with the exception of x2 small style left lower lateral eyelid with redness, no discharge or icterus. Neck/lymp/endocrine: Supple,no lymphadenopathy Neuro:Normal gait. PERLA. EOMi. Alert. Oriented x3  CNo exam data present No results found. No results found for this or any previous visit (from the past 24 hour(s)).  Assessment/Plan: Shaun Knight is a 67 y.o. male present for OV for  Skin lesion Excoriated comedone. Does not appear infected. Avoid ear buds. Use small amount of neosporin to area daily until healed.  Mass on the back of ear appears to be small cyst. Reassurance.   Hordeolum externum of left lower eyelid Erythromycin ointment QHS 5-7 days  prescribed If not resolved within 2 weeks, follow up with PCP.    Reviewed expectations re: course of current medical issues.  Discussed self-management of symptoms.  Outlined signs and symptoms indicating need for more acute intervention.  Patient verbalized understanding and all questions were answered.  Patient received an After-Visit Summary.   > 15 minutes spent with patient, >50% of time spent face to face counseling    No orders of the defined types were placed in this encounter.    Note is dictated utilizing voice  recognition software. Although note has been proof read prior to signing, occasional typographical errors still can be missed. If any questions arise, please do not hesitate to call for verification.   electronically signed by:  Howard Pouch, DO  [place]

## 2018-08-19 DIAGNOSIS — N402 Nodular prostate without lower urinary tract symptoms: Secondary | ICD-10-CM

## 2018-08-19 HISTORY — DX: Nodular prostate without lower urinary tract symptoms: N40.2

## 2018-08-28 ENCOUNTER — Ambulatory Visit (INDEPENDENT_AMBULATORY_CARE_PROVIDER_SITE_OTHER): Payer: Medicare Other | Admitting: Family Medicine

## 2018-08-28 ENCOUNTER — Encounter: Payer: Self-pay | Admitting: Family Medicine

## 2018-08-28 VITALS — BP 124/83 | HR 69 | Temp 98.5°F | Resp 16 | Ht 67.0 in | Wt 205.2 lb

## 2018-08-28 DIAGNOSIS — E669 Obesity, unspecified: Secondary | ICD-10-CM | POA: Diagnosis not present

## 2018-08-28 DIAGNOSIS — Z125 Encounter for screening for malignant neoplasm of prostate: Secondary | ICD-10-CM

## 2018-08-28 DIAGNOSIS — I10 Essential (primary) hypertension: Secondary | ICD-10-CM

## 2018-08-28 DIAGNOSIS — E119 Type 2 diabetes mellitus without complications: Secondary | ICD-10-CM

## 2018-08-28 DIAGNOSIS — N402 Nodular prostate without lower urinary tract symptoms: Secondary | ICD-10-CM | POA: Diagnosis not present

## 2018-08-28 DIAGNOSIS — Z23 Encounter for immunization: Secondary | ICD-10-CM | POA: Diagnosis not present

## 2018-08-28 DIAGNOSIS — E7800 Pure hypercholesterolemia, unspecified: Secondary | ICD-10-CM

## 2018-08-28 LAB — COMPREHENSIVE METABOLIC PANEL
ALBUMIN: 4.4 g/dL (ref 3.5–5.2)
ALT: 18 U/L (ref 0–53)
AST: 14 U/L (ref 0–37)
Alkaline Phosphatase: 56 U/L (ref 39–117)
BILIRUBIN TOTAL: 1 mg/dL (ref 0.2–1.2)
BUN: 18 mg/dL (ref 6–23)
CHLORIDE: 96 meq/L (ref 96–112)
CO2: 32 meq/L (ref 19–32)
Calcium: 9.6 mg/dL (ref 8.4–10.5)
Creatinine, Ser: 1.14 mg/dL (ref 0.40–1.50)
GFR: 68.08 mL/min (ref >60.00)
Glucose, Bld: 218 mg/dL — ABNORMAL HIGH (ref 70–99)
Potassium: 3.9 mEq/L (ref 3.5–5.1)
Sodium: 137 mEq/L (ref 135–145)
Total Protein: 7.1 g/dL (ref 6.0–8.3)

## 2018-08-28 LAB — LIPID PANEL
CHOL/HDL RATIO: 5
CHOLESTEROL: 182 mg/dL (ref 0–200)
HDL: 38.3 mg/dL — ABNORMAL LOW (ref >39.00)
NonHDL: 143.51
Triglycerides: 262 mg/dL — ABNORMAL HIGH (ref 0.0–149.0)
VLDL: 52.4 mg/dL — AB (ref 0.0–40.0)

## 2018-08-28 LAB — PSA, MEDICARE: PSA: 0.59 ng/mL (ref 0.10–4.00)

## 2018-08-28 LAB — CBC WITH DIFFERENTIAL/PLATELET
BASOS ABS: 0 10*3/uL (ref 0.0–0.1)
Basophils Relative: 0.8 % (ref 0.0–3.0)
Eosinophils Absolute: 0.2 10*3/uL (ref 0.0–0.7)
Eosinophils Relative: 3 % (ref 0.0–5.0)
HEMATOCRIT: 43.7 % (ref 39.0–52.0)
Hemoglobin: 15.2 g/dL (ref 13.0–17.0)
Lymphocytes Relative: 20.4 % (ref 12.0–46.0)
Lymphs Abs: 1 10*3/uL (ref 0.7–4.0)
MCHC: 34.7 g/dL (ref 30.0–36.0)
MCV: 89.4 fl (ref 78.0–100.0)
MONOS PCT: 6.8 % (ref 3.0–12.0)
Monocytes Absolute: 0.3 10*3/uL (ref 0.1–1.0)
Neutro Abs: 3.5 10*3/uL (ref 1.4–7.7)
Neutrophils Relative %: 69 % (ref 43.0–77.0)
Platelets: 182 10*3/uL (ref 150.0–400.0)
RBC: 4.89 Mil/uL (ref 4.22–5.81)
RDW: 13.7 % (ref 11.5–15.5)
WBC: 5.1 10*3/uL (ref 4.0–10.5)

## 2018-08-28 LAB — LDL CHOLESTEROL, DIRECT: Direct LDL: 103 mg/dL

## 2018-08-28 LAB — TSH: TSH: 1.22 u[IU]/mL (ref 0.35–4.50)

## 2018-08-28 LAB — HEMOGLOBIN A1C: Hgb A1c MFr Bld: 10.6 % — ABNORMAL HIGH (ref 4.6–6.5)

## 2018-08-28 MED ORDER — ZOSTER VAC RECOMB ADJUVANTED 50 MCG/0.5ML IM SUSR: 0.5000 mL | Freq: Once | INTRAMUSCULAR | 1 refills | Status: AC

## 2018-08-28 NOTE — Progress Notes (Signed)
Office Note 08/28/2018  CC:  Chief Complaint  Patient presents with  . Follow-up    RCI, pt is fasting.     HPI:  Shaun Knight is a 67 y.o. White male who is here for f/u DM 2, HTN, HLD.  DM: no home glucoses.  No vision c/o, no polyuria or polydipsia. HTN: no home bp monitoring. HLD: tolerating statin.    ROS: no CP, no SOB, no wheezing, no cough, no dizziness, no HAs, no rashes, no melena/hematochezia.  No polyuria or polydipsia.  No myalgias or arthralgias.  Past Medical History:  Diagnosis Date  . Adenomatous colon polyp 04/2006; 2018   2007 Dr. Lajoyce Corners; 2018 Dr. Stefano Gaul 04/2022.  Marland Kitchen Chronic renal insufficiency, stage II (mild) 2017   CrCl 60s  . Diabetes mellitus   . Diverticulosis of colon    hospitalized for diverticulitis in remote past; also had episode 08/27/14  . Fatty liver 2015   Noted on noncontrast CT done for eval for kidney stones  . History of ETT    LOW RISK/NEG ETT 02/2011 w/Dr. Doreatha Lew  . History of hemorrhoids   . Hyperlipidemia    Crestor caused leg cramps  . Hypertension   . Left foot pain    medial: post tibial tendonitis---podiatrist did sheath injection 03/2018  . Nephrolithiasis    Saw urologist in [place] past ([facility]). Left mid ureteral stone 01/2016--ED visit.  . Renal cyst, left    hemorrhagic vs proteinacious (last f/u CT 2007 showed stability)--Dr. Lajoyce Corners.  . Shoulder pain, right     Injection on 2 consec mo at [place] (Dr. Marlou Sa) 2012    Past Surgical History:  Procedure Laterality Date  . CHALAZION EXCISION  summer 2015  . COLONOSCOPY W/ POLYPECTOMY  04/2006; 04/2017   2007: Adenomatous polyps x 2; recall 5 yrs (Dr. Lajoyce Corners).  Repeat 04/2017 (Dr. Philomena Doheny adenoma x1 + sigmoid diverticulosis).  Recall 5 yrs.  . ESOPHAGOGASTRODUODENOSCOPY  2008   Esophagitis/gastritis/GERD.  H pylori and Barrett's NEG.  Marland Kitchen HEMORRHOID SURGERY    . NOSE SURGERY     deviated septum    Family History  Problem Relation Age of Onset  .  Diabetes Mother   . Hypertension Mother   . Coronary artery disease Mother   . Diabetes Father   . Hypertension Father   . Coronary artery disease Father   . Lymphoma Father        non-hodgkin  . Obesity Daughter   . Cancer Paternal Grandmother        unknown    Social History   Socioeconomic History  . Marital status: Married    Spouse name: Not on file  . Number of children: Not on file  . Years of education: Not on file  . Highest education level: Not on file  Occupational History  . Not on file  Social Needs  . Financial resource strain: Not on file  . Food insecurity:    Worry: Not on file    Inability: Not on file  . Transportation needs:    Medical: Not on file    Non-medical: Not on file  Tobacco Use  . Smoking status: Never Smoker  . Smokeless tobacco: Never Used  Substance and Sexual Activity  . Alcohol use: No  . Drug use: No  . Sexual activity: Yes    Partners: Female  Lifestyle  . Physical activity:    Days per week: Not on file    Minutes per session: Not on file  .  Stress: Not on file  Relationships  . Social connections:    Talks on phone: Not on file    Gets together: Not on file    Attends religious service: Not on file    Active member of club or organization: Not on file    Attends meetings of clubs or organizations: Not on file    Relationship status: Not on file  . Intimate partner violence:    Fear of current or ex partner: Not on file    Emotionally abused: Not on file    Physically abused: Not on file    Forced sexual activity: Not on file  Other Topics Concern  . Not on file  Social History Narrative   Married, 1 son and 1 daughter.   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   No T/A/Ds.    Outpatient Medications Prior to Visit  Medication Sig Dispense Refill  . atorvastatin (LIPITOR) 80 MG tablet TAKE 1 TABLET BY MOUTH DAILY 90 tablet 1  . canagliflozin (INVOKANA) 100 MG TABS  tablet Take 100 mg by mouth daily before breakfast.    . cetirizine (ZYRTEC) 10 MG tablet Take 10 mg by mouth daily.     . hydrochlorothiazide (HYDRODIURIL) 25 MG tablet TAKE 1 TABLET BY MOUTH DAILY 90 tablet 1  . Insulin Syringe-Needle U-100 25G X 5/8" 1 ML MISC Use to inject insulin twice a day 100 each 6  . liraglutide (VICTOZA) 18 MG/3ML SOPN Inject 0.2 mLs (1.2 mg total) into the skin daily. 15 pen 3  . metFORMIN (GLUCOPHAGE) 1000 MG tablet TAKE 1 TABLET BY MOUTH TWICE DAILY 180 tablet 1  . NOVOFINE 32G X 6 MM MISC USE AS DIRECTED 100 each 0  . pantoprazole (PROTONIX) 40 MG tablet TAKE 1 TABLET BY MOUTH DAILY 90 tablet 1  . erythromycin ophthalmic ointment Place 1 application into the left eye at bedtime. (Patient not taking: Reported on 08/28/2018) 3.5 g 0  . Glucose Blood (FREESTYLE LITE TEST VI)     . ONE TOUCH ULTRA TEST test strip TEST SUGAR DAILY (Patient not taking: Reported on 08/28/2018) 100 each 11  . ONETOUCH DELICA LANCETS 13K MISC USE AS DIRECTED (Patient not taking: Reported on 08/28/2018) 100 each 11   No facility-administered medications prior to visit.     No Known Allergies  ROS Review of Systems  Constitutional: Negative for appetite change, chills, fatigue and fever.  HENT: Negative for congestion, dental problem, ear pain and sore throat.   Eyes: Negative for discharge, redness and visual disturbance.  Respiratory: Negative for cough, chest tightness, shortness of breath and wheezing.   Cardiovascular: Negative for chest pain, palpitations and leg swelling.  Gastrointestinal: Negative for abdominal pain, blood in stool, diarrhea, nausea and vomiting.  Genitourinary: Negative for difficulty urinating, dysuria, flank pain, frequency, hematuria and urgency.  Musculoskeletal: Negative for arthralgias, back pain, joint swelling, myalgias and neck stiffness.  Skin: Negative for pallor and rash.  Neurological: Negative for dizziness, speech difficulty, weakness and  headaches.  Hematological: Negative for adenopathy. Does not bruise/bleed easily.  Psychiatric/Behavioral: Negative for confusion and sleep disturbance. The patient is not nervous/anxious.     PE; Blood pressure 124/83, pulse 69, temperature 98.5 F (36.9 C), temperature source Oral, resp. rate 16, height 5\' 7"  (1.702 m), weight 205 lb 4 oz (93.1 kg), SpO2 97 %. Body mass index is 32.15 kg/m.  Gen: Alert, well appearing.  Patient is oriented to person, place, time, and situation. AFFECT:  pleasant, lucid thought and speech. ENT: Ears: EACs clear, normal epithelium.  TMs with good light reflex and landmarks bilaterally.  Eyes: no injection, icteris, swelling, or exudate.  EOMI, PERRLA. Nose: no drainage or turbinate edema/swelling.  No injection or focal lesion.  Mouth: lips without lesion/swelling.  Oral mucosa pink and moist.  Dentition intact and without obvious caries or gingival swelling.  Oropharynx without erythema, exudate, or swelling.  Neck: supple/nontender.  No LAD, mass, or TM.  Carotid pulses 2+ bilaterally, without bruits. CV: RRR, no m/r/g.   LUNGS: CTA bilat, nonlabored resps, good aeration in all lung fields. ABD: soft, NT, ND, BS normal.  No hepatospenomegaly or mass.  No bruits. EXT: no clubbing, cyanosis, or edema.  Musculoskeletal: no joint swelling, erythema, warmth, or tenderness.  ROM of all joints intact. Skin - no sores or suspicious lesions or rashes or color changes Rectal exam: negative without mass, lesions or tenderness, PROSTATE EXAM: smooth and symmetric without nodules or tenderness, normal in size, symmetrical, nodule about 1-2 cm in size in inferior portion of prostate, soft and non tender.   Pertinent labs:  Lab Results  Component Value Date   TSH 1.11 08/25/2017   Lab Results  Component Value Date   WBC 5.8 10/24/2017   HGB 14.4 10/24/2017   HCT 42.7 10/24/2017   MCV 91.9 10/24/2017   PLT 201.0 10/24/2017   Lab Results  Component Value Date    CREATININE 1.11 03/05/2018   BUN 20 03/05/2018   NA 135 03/05/2018   K 3.9 03/05/2018   CL 95 (L) 03/05/2018   CO2 28 03/05/2018   Lab Results  Component Value Date   ALT 18 08/25/2017   AST 16 08/25/2017   ALKPHOS 50 08/25/2017   BILITOT 0.9 08/25/2017   Lab Results  Component Value Date   CHOL 191 08/25/2017   Lab Results  Component Value Date   HDL 36.20 (L) 08/25/2017   Lab Results  Component Value Date   LDLCALC 99 11/26/2012   Lab Results  Component Value Date   TRIG 245.0 (H) 08/25/2017   Lab Results  Component Value Date   CHOLHDL 5 08/25/2017   Lab Results  Component Value Date   PSA 0.51 08/25/2017   PSA 0.51 07/11/2016   PSA 0.29 02/08/2011   Lab Results  Component Value Date   HGBA1C 9.3 (H) 03/05/2018    ASSESSMENT AND PLAN:   1) DM 2:  A1c today.  2) HTN: The current medical regimen is effective;  continue present plan and medications. Lytes/cr today.  3) HLD: tolerating statin.  FLP and hepatic panel today.  4) Prostate nodule: checking PSA.  Refer to urology.  5) Preventative health:  Vaccines: pneumovax 23-->given today.  Flu vaccine-->given today.  Shingrix-->sent rx to pharmacy. Labs: CBC, CMET, TSH, FLP, HbA1c, PSA. Prostate ca screening: DRE , PSA. Colon ca screening: next colonoscopy due 04/2022.  An After Visit Summary was printed and given to the patient.  FOLLOW UP:  Return in about 3 months (around 11/27/2018) for routine chronic illness f/u.  Signed:  Crissie Sickles, MD           08/28/2018

## 2018-08-28 NOTE — Patient Instructions (Signed)

## 2018-08-28 NOTE — Addendum Note (Signed)
Addended by: Onalee Hua on: 08/28/2018 08:51 AM   Modules accepted: Orders

## 2018-08-30 ENCOUNTER — Other Ambulatory Visit: Payer: Self-pay

## 2018-08-30 MED ORDER — HYDROCHLOROTHIAZIDE 25 MG PO TABS: ORAL_TABLET | ORAL | 1 refills | Status: DC

## 2018-09-13 ENCOUNTER — Encounter: Payer: Self-pay | Admitting: Family Medicine

## 2018-09-14 ENCOUNTER — Other Ambulatory Visit: Payer: Self-pay | Admitting: *Deleted

## 2018-09-14 MED ORDER — LIRAGLUTIDE 18 MG/3ML ~~LOC~~ SOPN: 1.8000 mg | PEN_INJECTOR | Freq: Every day | SUBCUTANEOUS | 0 refills | Status: DC

## 2018-09-14 MED ORDER — CANAGLIFLOZIN 100 MG PO TABS: 100.0000 mg | ORAL_TABLET | Freq: Every day | ORAL | 1 refills | Status: DC

## 2018-09-14 MED ORDER — EMPAGLIFLOZIN 10 MG PO TABS: 10.0000 mg | ORAL_TABLET | Freq: Every day | ORAL | 0 refills | Status: DC

## 2018-09-14 MED ORDER — FREESTYLE LIBRE 14 DAY SENSOR MISC: 1.0000 | Freq: Three times a day (TID) | 5 refills | Status: DC

## 2018-09-14 MED ORDER — FREESTYLE LIBRE 14 DAY READER DEVI: 1.0000 | Freq: Every day | 0 refills | Status: DC

## 2018-09-14 NOTE — Progress Notes (Signed)
Received fax requesting PA for Invokana.   Alternatives were: Elsie Stain or Farxiga  Per Dr. Anitra Lauth okay to send in Rx for Jardiance 10mg  take 1 tablet every AM #90 w/ 0RF. Also have pt increase Victoza to 1.8mg  daily.  Pt advised and voiced understanding.    Rx sent. Medication list updated.

## 2018-09-18 ENCOUNTER — Other Ambulatory Visit: Payer: Self-pay | Admitting: Family Medicine

## 2018-10-01 ENCOUNTER — Other Ambulatory Visit: Payer: Self-pay | Admitting: Family Medicine

## 2018-10-17 ENCOUNTER — Other Ambulatory Visit: Payer: Self-pay | Admitting: Family Medicine

## 2018-11-13 ENCOUNTER — Other Ambulatory Visit: Payer: Self-pay | Admitting: Family Medicine

## 2018-11-13 MED ORDER — LIRAGLUTIDE 18 MG/3ML ~~LOC~~ SOPN: 1.8000 mg | PEN_INJECTOR | Freq: Every day | SUBCUTANEOUS | 1 refills | Status: DC

## 2018-11-28 ENCOUNTER — Ambulatory Visit (INDEPENDENT_AMBULATORY_CARE_PROVIDER_SITE_OTHER): Payer: Medicare Other | Admitting: Family Medicine

## 2018-11-28 ENCOUNTER — Encounter: Payer: Self-pay | Admitting: Family Medicine

## 2018-11-28 VITALS — BP 134/88 | HR 62 | Temp 97.8°F | Resp 16 | Ht 67.0 in | Wt 197.0 lb

## 2018-11-28 DIAGNOSIS — E119 Type 2 diabetes mellitus without complications: Secondary | ICD-10-CM | POA: Diagnosis not present

## 2018-11-28 DIAGNOSIS — Z9119 Patient's noncompliance with other medical treatment and regimen: Secondary | ICD-10-CM

## 2018-11-28 DIAGNOSIS — I10 Essential (primary) hypertension: Secondary | ICD-10-CM

## 2018-11-28 LAB — BASIC METABOLIC PANEL
BUN: 19 mg/dL (ref 6–23)
CHLORIDE: 97 meq/L (ref 96–112)
CO2: 32 meq/L (ref 19–32)
Calcium: 9.6 mg/dL (ref 8.4–10.5)
Creatinine, Ser: 1.12 mg/dL (ref 0.40–1.50)
GFR: 69.43 mL/min (ref >60.00)
GLUCOSE: 156 mg/dL — AB (ref 70–99)
POTASSIUM: 4.2 meq/L (ref 3.5–5.1)
SODIUM: 138 meq/L (ref 135–145)

## 2018-11-28 LAB — HEMOGLOBIN A1C: HEMOGLOBIN A1C: 8.8 % — AB (ref 4.6–6.5)

## 2018-11-28 NOTE — Progress Notes (Signed)
OFFICE VISIT  11/28/2018   CC:  Chief Complaint  Patient presents with  . Follow-up    URI   HPI:    Patient is a 67 y.o. Caucasian male who presents for 3 mo f/u DM 2,  A1c up last visit, he had not been taking invokana, so we got him back on this med to see how his a1c would respond.  He cannot recall the last glucose he checked.  He states he feels well.  He does have a glucometer.  He is convinced that he can tell when his sugar is by the way he feels.  He states he is eating better lately. He has been back on the invokana since last visit.  He has also been on victoza 1.2 qd.  Prostate nodule detected on last exam: referred pt to urologist.  He has not found time to arrange this yet.  ROS: no CP, no SOB, no wheezing, no cough, no dizziness, no HAs, no rashes, no melena/hematochezia.  No polyuria or polydipsia.  No myalgias or arthralgias.  Past Medical History:  Diagnosis Date  . Adenomatous colon polyp 04/2006; 2018   2007 Dr. Lajoyce Corners; 2018 Dr. Stefano Gaul 04/2022.  Marland Kitchen Chronic renal insufficiency, stage II (mild) 2017   CrCl 60s  . Diabetes mellitus   . Diverticulosis of colon    hospitalized for diverticulitis in remote past; also had episode 08/27/14  . Fatty liver 2015   Noted on noncontrast CT done for eval for kidney stones  . History of ETT    LOW RISK/NEG ETT 02/2011 w/Dr. Doreatha Lew  . History of hemorrhoids   . Hyperlipidemia    Crestor caused leg cramps  . Hypertension   . Left foot pain    medial: post tibial tendonitis---podiatrist did sheath injection 03/2018  . Nephrolithiasis    Saw urologist in [place] past ([facility]). Left mid ureteral stone 01/2016--ED visit.  . Prostate nodule 08/2018   Referred to urol (PSA was normal/unchanged from prev year).  . Renal cyst, left    hemorrhagic vs proteinacious (last f/u CT 2007 showed stability)--Dr. Lajoyce Corners.  . Shoulder pain, right     Injection on 2 consec mo at [place] (Dr. Marlou Sa) 2012    Past Surgical  History:  Procedure Laterality Date  . CHALAZION EXCISION  summer 2015  . COLONOSCOPY W/ POLYPECTOMY  04/2006; 04/2017   2007: Adenomatous polyps x 2; recall 5 yrs (Dr. Lajoyce Corners).  Repeat 04/2017 (Dr. Philomena Doheny adenoma x1 + sigmoid diverticulosis).  Recall 5 yrs.  . ESOPHAGOGASTRODUODENOSCOPY  2008   Esophagitis/gastritis/GERD.  H pylori and Barrett's NEG.  Marland Kitchen HEMORRHOID SURGERY    . NOSE SURGERY     deviated septum    Outpatient Medications Prior to Visit  Medication Sig Dispense Refill  . atorvastatin (LIPITOR) 80 MG tablet TAKE 1 TABLET BY MOUTH DAILY 90 tablet 0  . canagliflozin (INVOKANA) 100 MG TABS tablet Take 1 tablet (100 mg total) by mouth daily before breakfast. 90 tablet 1  . cetirizine (ZYRTEC) 10 MG tablet Take 10 mg by mouth daily.     . Continuous Blood Gluc Receiver (FREESTYLE LIBRE 14 DAY READER) DEVI 1 each by Does not apply route daily. 1 Device 0  . Continuous Blood Gluc Sensor (FREESTYLE LIBRE 14 DAY SENSOR) MISC 1 each by Does not apply route 3 (three) times daily. 3 each 5  . erythromycin ophthalmic ointment Place 1 application into the left eye at bedtime. 3.5 g 0  . Glucose Blood (FREESTYLE  LITE TEST VI)     . hydrochlorothiazide (HYDRODIURIL) 25 MG tablet TAKE 1 TABLET BY MOUTH DAILY 90 tablet 1  . Insulin Syringe-Needle U-100 25G X 5/8" 1 ML MISC Use to inject insulin twice a day 100 each 6  . liraglutide (VICTOZA) 18 MG/3ML SOPN Inject 0.3 mLs (1.8 mg total) into the skin daily. 9 pen 1  . metFORMIN (GLUCOPHAGE) 1000 MG tablet TAKE 1 TABLET BY MOUTH TWICE DAILY 180 tablet 1  . NOVOFINE 32G X 6 MM MISC USE AS DIRECTED 100 each 0  . ONE TOUCH ULTRA TEST test strip TEST SUGAR DAILY 100 each 11  . ONETOUCH DELICA LANCETS 35T MISC USE AS DIRECTED 100 each 11  . pantoprazole (PROTONIX) 40 MG tablet TAKE 1 TABLET BY MOUTH DAILY 90 tablet 0  . empagliflozin (JARDIANCE) 10 MG TABS tablet Take 10 mg by mouth daily. 90 tablet 0   No facility-administered medications  prior to visit.     No Known Allergies  ROS As per HPI  PE: Blood pressure 134/88, pulse 62, temperature 97.8 F (36.6 C), temperature source Oral, resp. rate 16, height 5\' 7"  (1.702 m), weight 197 lb (89.4 kg), SpO2 95 %. Gen: Alert, well appearing.  Patient is oriented to person, place, time, and situation. AFFECT: pleasant, lucid thought and speech. No further exam today.  LABS:    Chemistry      Component Value Date/Time   NA 137 08/28/2018 0846   K 3.9 08/28/2018 0846   CL 96 08/28/2018 0846   CO2 32 08/28/2018 0846   BUN 18 08/28/2018 0846   CREATININE 1.14 08/28/2018 0846      Component Value Date/Time   CALCIUM 9.6 08/28/2018 0846   ALKPHOS 56 08/28/2018 0846   AST 14 08/28/2018 0846   ALT 18 08/28/2018 0846   BILITOT 1.0 08/28/2018 0846     Lab Results  Component Value Date   WBC 5.1 08/28/2018   HGB 15.2 08/28/2018   HCT 43.7 08/28/2018   MCV 89.4 08/28/2018   PLT 182.0 08/28/2018   Lab Results  Component Value Date   CHOL 182 08/28/2018   HDL 38.30 (L) 08/28/2018   LDLCALC 99 11/26/2012   LDLDIRECT 103.0 08/28/2018   TRIG 262.0 (H) 08/28/2018   CHOLHDL 5 08/28/2018   Lab Results  Component Value Date   HGBA1C 10.6 (H) 08/28/2018   Lab Results  Component Value Date   PSA 0.59 08/28/2018   PSA 0.51 08/25/2017   PSA 0.51 07/11/2016    IMPRESSION AND PLAN:  DM 2, poor control. More compliant with meds but still noncompliant with homd gluc monitoring. He will likely never do this. HbA1c today. Reminded pt of need for eye exam. BMET today.  An After Visit Summary was printed and given to the patient.  FOLLOW UP: Return in about 3 months (around 02/27/2019) for routine chronic illness f/u.  Signed:  Crissie Sickles, MD           11/28/2018

## 2018-11-29 ENCOUNTER — Encounter: Payer: Self-pay | Admitting: *Deleted

## 2018-12-10 ENCOUNTER — Encounter: Payer: Self-pay | Admitting: *Deleted

## 2018-12-10 ENCOUNTER — Other Ambulatory Visit: Payer: Self-pay | Admitting: Family Medicine

## 2018-12-15 ENCOUNTER — Other Ambulatory Visit: Payer: Self-pay | Admitting: Family Medicine

## 2018-12-18 ENCOUNTER — Other Ambulatory Visit: Payer: Self-pay | Admitting: Family Medicine

## 2018-12-21 ENCOUNTER — Other Ambulatory Visit: Payer: Self-pay | Admitting: Family Medicine

## 2018-12-21 NOTE — Telephone Encounter (Signed)
Pt stated that he contacted the pharmacy 3 days ago for a refill. They are just sending a refill request over today. He is out of his Victoza. Please advise.

## 2018-12-27 ENCOUNTER — Other Ambulatory Visit: Payer: Self-pay | Admitting: Family Medicine

## 2018-12-31 ENCOUNTER — Telehealth: Payer: Self-pay | Admitting: Family Medicine

## 2018-12-31 NOTE — Telephone Encounter (Signed)
Jardiance was stopped 11/28/18 and invokana was restarted per last OV.   jardiance RX refill request was refused.

## 2018-12-31 NOTE — Telephone Encounter (Signed)
Copied from [place] [id number]. Topic: Quick Communication - Rx Refill/Question >> Dec 31, 2018 12:22 PM Windy Kalata wrote: Medication: empagliflozin (JARDIANCE) 10 MG TABS tablet   Has the patient contacted their pharmacy? Yes.   (Agent: If no, request that the patient contact the pharmacy for the refill.) (Agent: If yes, when and what did the pharmacy advise?) Call office for refill  Preferred Pharmacy (with phone number or street name): [id number] - [place], [place] - [address] [place] [address] [phone] (Phone) [phone] (Fax)    Agent: Please be advised that RX refills may take up to 3 business days. We ask that you follow-up with your pharmacy.

## 2018-12-31 NOTE — Telephone Encounter (Signed)
Need to review chart for correct medication- Shaun Knight is not current on medication list.

## 2019-01-02 ENCOUNTER — Other Ambulatory Visit: Payer: Self-pay | Admitting: *Deleted

## 2019-01-02 MED ORDER — CANAGLIFLOZIN 100 MG PO TABS: 100.0000 mg | ORAL_TABLET | Freq: Every day | ORAL | 1 refills | Status: DC

## 2019-01-02 NOTE — Progress Notes (Signed)
Pt called stating that he has been taking both the invokana and the jardiance. I spoke with Dr. Anitra Lauth who stated that pt should not be taking both and to take on or the other. Pt advised and asked that I speak with the pharmacist. SW the pharmacist and she stated that they do not have an active Rx for invokana. I advised her that we will send in new Rx for invokana.  Rx sent.

## 2019-01-03 MED ORDER — EMPAGLIFLOZIN 25 MG PO TABS: 25.0000 mg | ORAL_TABLET | Freq: Every day | ORAL | 1 refills | Status: DC

## 2019-01-03 NOTE — Progress Notes (Signed)
Received fax from pharmacy stating that Anastasio Auerbach is not covered by pts insurance.   Alternatives are: *Jardiance *Farxiga  Per Dr. Idelle Leech v/o please send in Rx for Jardiance 25mg  tab take 1 tab daily #90 w/ 1RF.  Rx sent.

## 2019-01-03 NOTE — Addendum Note (Signed)
Addended by: Onalee Hua on: 01/03/2019 09:51 AM   Modules accepted: Orders

## 2019-02-23 ENCOUNTER — Other Ambulatory Visit: Payer: Self-pay | Admitting: Family Medicine

## 2019-02-27 ENCOUNTER — Other Ambulatory Visit: Payer: Self-pay

## 2019-02-27 ENCOUNTER — Encounter: Payer: Self-pay | Admitting: Family Medicine

## 2019-02-27 ENCOUNTER — Ambulatory Visit (INDEPENDENT_AMBULATORY_CARE_PROVIDER_SITE_OTHER): Payer: Medicare Other | Admitting: Family Medicine

## 2019-02-27 ENCOUNTER — Ambulatory Visit (INDEPENDENT_AMBULATORY_CARE_PROVIDER_SITE_OTHER): Payer: Medicare Other | Admitting: Podiatry

## 2019-02-27 ENCOUNTER — Encounter: Payer: Self-pay | Admitting: Podiatry

## 2019-02-27 VITALS — BP 127/81 | HR 67 | Temp 97.5°F | Resp 16 | Ht 67.0 in | Wt 197.2 lb

## 2019-02-27 DIAGNOSIS — I10 Essential (primary) hypertension: Secondary | ICD-10-CM | POA: Diagnosis not present

## 2019-02-27 DIAGNOSIS — E7800 Pure hypercholesterolemia, unspecified: Secondary | ICD-10-CM | POA: Diagnosis not present

## 2019-02-27 DIAGNOSIS — M76822 Posterior tibial tendinitis, left leg: Secondary | ICD-10-CM | POA: Diagnosis not present

## 2019-02-27 DIAGNOSIS — E119 Type 2 diabetes mellitus without complications: Secondary | ICD-10-CM | POA: Diagnosis not present

## 2019-02-27 DIAGNOSIS — N182 Chronic kidney disease, stage 2 (mild): Secondary | ICD-10-CM

## 2019-02-27 LAB — BASIC METABOLIC PANEL
BUN: 28 mg/dL — ABNORMAL HIGH (ref 6–23)
CO2: 32 mEq/L (ref 19–32)
Calcium: 9.8 mg/dL (ref 8.4–10.5)
Chloride: 96 mEq/L (ref 96–112)
Creatinine, Ser: 1.16 mg/dL (ref 0.40–1.50)
GFR: 62.68 mL/min (ref >60.00)
Glucose, Bld: 189 mg/dL — ABNORMAL HIGH (ref 70–99)
Potassium: 4 mEq/L (ref 3.5–5.1)
Sodium: 138 mEq/L (ref 135–145)

## 2019-02-27 LAB — HEMOGLOBIN A1C: Hgb A1c MFr Bld: 9.8 % — ABNORMAL HIGH (ref 4.6–6.5)

## 2019-02-27 MED ORDER — TRIAMCINOLONE ACETONIDE 10 MG/ML IJ SUSP
10.0000 mg | Freq: Once | INTRAMUSCULAR | Status: AC
  Administered 2019-02-27: 10 mg

## 2019-02-27 MED ORDER — PEN NEEDLES 31G X 5 MM MISC: 1.0000 | Freq: Every day | 5 refills | Status: DC

## 2019-02-27 MED ORDER — ZOSTER VAC RECOMB ADJUVANTED 50 MCG/0.5ML IM SUSR: 0.5000 mL | Freq: Once | INTRAMUSCULAR | 1 refills | Status: AC

## 2019-02-27 NOTE — Addendum Note (Signed)
Addended by: Deveron Furlong D on: 02/27/2019 09:37 AM   Modules accepted: Orders

## 2019-02-27 NOTE — Progress Notes (Signed)
OFFICE VISIT  02/27/2019   CC:  Chief Complaint  Patient presents with  . Follow-up    RCI, pt is fasting   HPI:    Patient is a 68 y.o. Caucasian male who presents for f/u DM 2, HTN, HLD, CRI II/III. Doing well.   Has little details about diet.  Says he is compliant with his meds.  HLD: tolerating statin. Diet fair at times, other times lots of fast food.  CRI: avoids NSAIDs. He almost never has any pain at all.   He says he hydrates well.  Selling HEMP now!  ROS: no CP, no SOB, no wheezing, no cough, no dizziness, no HAs, no rashes, no melena/hematochezia.  No polyuria or polydipsia.  No myalgias or arthralgias.  Past Medical History:  Diagnosis Date  . Adenomatous colon polyp 04/2006; 2018   2007 Dr. Lajoyce Corners; 2018 Dr. Stefano Gaul 04/2022.  Marland Kitchen Chronic renal insufficiency, stage II (mild) 2017   CrCl 60s  . Diabetes mellitus   . Diverticulosis of colon    hospitalized for diverticulitis in remote past; also had episode 08/27/14  . Fatty liver 2015   Noted on noncontrast CT done for eval for kidney stones  . History of ETT    LOW RISK/NEG ETT 02/2011 w/Dr. Doreatha Lew  . History of hemorrhoids   . Hyperlipidemia    Crestor caused leg cramps  . Hypertension   . Left foot pain    medial: post tibial tendonitis---podiatrist did sheath injection 03/2018  . Nephrolithiasis    Saw urologist in [place] past ([facility]). Left mid ureteral stone 01/2016--ED visit.  . Prostate nodule 08/2018   Referred to urol (PSA was normal/unchanged from prev year).  . Renal cyst, left    hemorrhagic vs proteinacious (last f/u CT 2007 showed stability)--Dr. Lajoyce Corners.  . Shoulder pain, right     Injection on 2 consec mo at [place] (Dr. Marlou Sa) 2012    Past Surgical History:  Procedure Laterality Date  . CHALAZION EXCISION  summer 2015  . COLONOSCOPY W/ POLYPECTOMY  04/2006; 04/2017   2007: Adenomatous polyps x 2; recall 5 yrs (Dr. Lajoyce Corners).  Repeat 04/2017 (Dr. Philomena Doheny adenoma x1 + sigmoid  diverticulosis).  Recall 5 yrs.  . ESOPHAGOGASTRODUODENOSCOPY  2008   Esophagitis/gastritis/GERD.  H pylori and Barrett's NEG.  Marland Kitchen HEMORRHOID SURGERY    . NOSE SURGERY     deviated septum    Outpatient Medications Prior to Visit  Medication Sig Dispense Refill  . atorvastatin (LIPITOR) 80 MG tablet TAKE 1 TABLET BY MOUTH DAILY 90 tablet 1  . cetirizine (ZYRTEC) 10 MG tablet Take 10 mg by mouth daily.     . Continuous Blood Gluc Receiver (FREESTYLE LIBRE 14 DAY READER) DEVI 1 each by Does not apply route daily. 1 Device 0  . Continuous Blood Gluc Sensor (FREESTYLE LIBRE 14 DAY SENSOR) MISC 1 each by Does not apply route 3 (three) times daily. 3 each 5  . empagliflozin (JARDIANCE) 25 MG TABS tablet Take 25 mg by mouth daily. 90 mg 1  . Glucose Blood (FREESTYLE LITE TEST VI)     . hydrochlorothiazide (HYDRODIURIL) 25 MG tablet TAKE 1 TABLET BY MOUTH EVERY DAY 90 tablet 1  . Insulin Syringe-Needle U-100 25G X 5/8" 1 ML MISC Use to inject insulin twice a day 100 each 6  . liraglutide (VICTOZA) 18 MG/3ML SOPN Inject 0.3 mLs (1.8 mg total) into the skin daily. 72 mL 1  . metFORMIN (GLUCOPHAGE) 1000 MG tablet TAKE 1 TABLET BY  MOUTH TWICE DAILY 180 tablet 1  . NOVOFINE 32G X 6 MM MISC USE AS DIRECTED 100 each 0  . ONE TOUCH ULTRA TEST test strip TEST SUGAR DAILY 100 each 11  . ONETOUCH DELICA LANCETS 73X MISC USE AS DIRECTED 100 each 11  . pantoprazole (PROTONIX) 40 MG tablet TAKE 1 TABLET BY MOUTH DAILY 90 tablet 0  . erythromycin ophthalmic ointment Place 1 application into the left eye at bedtime. (Patient not taking: Reported on 02/27/2019) 3.5 g 0   No facility-administered medications prior to visit.     No Known Allergies  ROS As per HPI  PE: Blood pressure 127/81, pulse 67, temperature (!) 97.5 F (36.4 C), temperature source Oral, resp. rate 16, height 5\' 7"  (1.702 m), weight 197 lb 3.2 oz (89.4 kg), SpO2 97 %. Body mass index is 30.89 kg/m.  Gen: Alert, well appearing.  Patient  is oriented to person, place, time, and situation. AFFECT: pleasant, lucid thought and speech. CV: RRR, no m/r/g.   LUNGS: CTA bilat, nonlabored resps, good aeration in all lung fields. EXT: no clubbing or cyanosis.  no edema.    LABS:  Lab Results  Component Value Date   TSH 1.22 08/28/2018   Lab Results  Component Value Date   WBC 5.1 08/28/2018   HGB 15.2 08/28/2018   HCT 43.7 08/28/2018   MCV 89.4 08/28/2018   PLT 182.0 08/28/2018   Lab Results  Component Value Date   CREATININE 1.12 11/28/2018   BUN 19 11/28/2018   NA 138 11/28/2018   K 4.2 11/28/2018   CL 97 11/28/2018   CO2 32 11/28/2018   Lab Results  Component Value Date   ALT 18 08/28/2018   AST 14 08/28/2018   ALKPHOS 56 08/28/2018   BILITOT 1.0 08/28/2018   Lab Results  Component Value Date   CHOL 182 08/28/2018   Lab Results  Component Value Date   HDL 38.30 (L) 08/28/2018   Lab Results  Component Value Date   LDLCALC 99 11/26/2012   Lab Results  Component Value Date   TRIG 262.0 (H) 08/28/2018   Lab Results  Component Value Date   CHOLHDL 5 08/28/2018   Lab Results  Component Value Date   PSA 0.59 08/28/2018   PSA 0.51 08/25/2017   PSA 0.51 07/11/2016   Lab Results  Component Value Date   HGBA1C 8.8 (H) 11/28/2018    IMPRESSION AND PLAN:  1) DM 2: historically not well controlled-->large amount of patient noncompliance with monitoring, meds, and following recommendations. He is overdue for his diab retpthy eye exam-reminded pt today. HbA1c + BMET today.  2) HTN:The current medical regimen is effective;  continue present plan and medications. Lytes/cr today.  3) HLD: last lipids at goal 08/2018. Tolerating statin. Repeat lipids 6 mo.  4) CRI: avoid nephrotoxic agents.  Hydrate well. Lytes/Cr today.  An After Visit Summary was printed and given to the patient.  FOLLOW UP: No follow-ups on file.  Signed:  Crissie Sickles, MD           02/27/2019

## 2019-02-28 NOTE — Progress Notes (Signed)
Subjective:   Patient ID: Shaun Knight, male   DOB: 68 y.o.   MRN: [id number]   HPI Patient states he was doing well but he just started developed pain recently in his left arch he would like another injection   ROS      Objective:  Physical Exam  Neurovascular status intact with patient found to have inflammation and pain at the posterior tibial insertion into the navicular left     Assessment:  Inflammatory posterior tibial tendinitis left with fluid buildup and pain of a moderate nature     Plan:  Discussed injection and explained risk and patient wants injection and I did a sterile prep of the area injected the sheath of the tendon 3 mg Kenalog 5 mg Xylocaine advised on reduced activity and support and reappoint to recheck

## 2019-03-05 ENCOUNTER — Other Ambulatory Visit: Payer: Self-pay | Admitting: Family Medicine

## 2019-03-12 ENCOUNTER — Other Ambulatory Visit: Payer: Self-pay | Admitting: Family Medicine

## 2019-03-12 MED ORDER — INSULIN NPH (HUMAN) (ISOPHANE) 100 UNIT/ML ~~LOC~~ SUSP: SUBCUTANEOUS | 2 refills | Status: DC

## 2019-03-12 NOTE — Progress Notes (Signed)
OK, I eRx'd insulin that he will inject once in the morning and once around supper. He needs to check his glucose before breakfast and before bedtime every night for the next 2 weeks, then call the office or use mychart message to report the numbers.  This insulin comes in vials only, so he'll need syringes and needles. He may also need lancets and strips--I don't know what supplies he has (if any) at home. No endocrinology referral at this time.-thx

## 2019-03-13 ENCOUNTER — Telehealth: Payer: Self-pay

## 2019-03-13 DIAGNOSIS — E119 Type 2 diabetes mellitus without complications: Secondary | ICD-10-CM

## 2019-03-13 MED ORDER — BLOOD GLUCOSE MONITOR KIT: PACK | 0 refills | Status: DC

## 2019-03-13 NOTE — Telephone Encounter (Signed)
Rx for meter and supplies printed, signed and faxed to patient's pharmacy.

## 2019-03-13 NOTE — Telephone Encounter (Signed)
Patient did not have meter or supplies. Rx printed and waiting to be signed and faxed.

## 2019-03-13 NOTE — Telephone Encounter (Signed)
Signed.

## 2019-03-14 ENCOUNTER — Telehealth: Payer: Self-pay

## 2019-03-14 NOTE — Telephone Encounter (Signed)
Opened in error

## 2019-03-19 ENCOUNTER — Telehealth: Payer: Self-pay

## 2019-03-19 NOTE — Telephone Encounter (Signed)
Tried to do PA for Humulin N 100U/ML and it is not covered with pt's insurance.  Alterative to use would be Novolin N 100 U/mL suspension instead.   Please advise, thanks.

## 2019-03-20 ENCOUNTER — Other Ambulatory Visit: Payer: Self-pay

## 2019-03-20 MED ORDER — INSULIN NPH (HUMAN) (ISOPHANE) 100 UNIT/ML ~~LOC~~ SUSP: SUBCUTANEOUS | 2 refills | Status: DC

## 2019-03-20 NOTE — Telephone Encounter (Signed)
OK, pls do this eRx with the exact same instructions that I gave for the Humulin N, same # of vials, same #of RF's.-thx

## 2019-03-20 NOTE — Telephone Encounter (Signed)
New Rx sent to pharmacy

## 2019-03-21 ENCOUNTER — Encounter: Payer: Self-pay | Admitting: Family Medicine

## 2019-03-30 ENCOUNTER — Other Ambulatory Visit: Payer: Self-pay | Admitting: Family Medicine

## 2019-05-07 ENCOUNTER — Other Ambulatory Visit: Payer: Self-pay | Admitting: Family Medicine

## 2019-06-16 ENCOUNTER — Other Ambulatory Visit: Payer: Self-pay | Admitting: Family Medicine

## 2019-07-06 ENCOUNTER — Other Ambulatory Visit: Payer: Self-pay | Admitting: Family Medicine

## 2019-08-03 ENCOUNTER — Other Ambulatory Visit: Payer: Self-pay | Admitting: Family Medicine

## 2019-08-20 ENCOUNTER — Other Ambulatory Visit: Payer: Self-pay | Admitting: Family Medicine

## 2019-08-24 ENCOUNTER — Other Ambulatory Visit: Payer: Self-pay | Admitting: Family Medicine

## 2019-08-28 ENCOUNTER — Ambulatory Visit: Payer: Medicare Other | Admitting: Podiatry

## 2019-08-28 ENCOUNTER — Other Ambulatory Visit: Payer: Self-pay

## 2019-09-02 ENCOUNTER — Ambulatory Visit: Payer: Medicare Other | Admitting: Family Medicine

## 2019-09-17 ENCOUNTER — Other Ambulatory Visit: Payer: Self-pay

## 2019-09-17 ENCOUNTER — Encounter: Payer: Self-pay | Admitting: Family Medicine

## 2019-09-17 ENCOUNTER — Ambulatory Visit (INDEPENDENT_AMBULATORY_CARE_PROVIDER_SITE_OTHER): Payer: Medicare Other | Admitting: Family Medicine

## 2019-09-17 VITALS — BP 115/77 | HR 68 | Temp 98.5°F | Resp 16 | Ht 67.0 in | Wt 196.2 lb

## 2019-09-17 DIAGNOSIS — Z23 Encounter for immunization: Secondary | ICD-10-CM

## 2019-09-17 DIAGNOSIS — E7800 Pure hypercholesterolemia, unspecified: Secondary | ICD-10-CM

## 2019-09-17 DIAGNOSIS — I10 Essential (primary) hypertension: Secondary | ICD-10-CM

## 2019-09-17 DIAGNOSIS — Z125 Encounter for screening for malignant neoplasm of prostate: Secondary | ICD-10-CM | POA: Diagnosis not present

## 2019-09-17 DIAGNOSIS — E119 Type 2 diabetes mellitus without complications: Secondary | ICD-10-CM

## 2019-09-17 DIAGNOSIS — N402 Nodular prostate without lower urinary tract symptoms: Secondary | ICD-10-CM

## 2019-09-17 LAB — COMPREHENSIVE METABOLIC PANEL
ALT: 23 U/L (ref 0–53)
AST: 18 U/L (ref 0–37)
Albumin: 4.5 g/dL (ref 3.5–5.2)
Alkaline Phosphatase: 60 U/L (ref 39–117)
BUN: 19 mg/dL (ref 6–23)
CO2: 31 mEq/L (ref 19–32)
Calcium: 9.5 mg/dL (ref 8.4–10.5)
Chloride: 95 mEq/L — ABNORMAL LOW (ref 96–112)
Creatinine, Ser: 1.09 mg/dL (ref 0.40–1.50)
GFR: 67.24 mL/min (ref >60.00)
Glucose, Bld: 143 mg/dL — ABNORMAL HIGH (ref 70–99)
Potassium: 3.4 mEq/L — ABNORMAL LOW (ref 3.5–5.1)
Sodium: 138 mEq/L (ref 135–145)
Total Bilirubin: 0.9 mg/dL (ref 0.2–1.2)
Total Protein: 7 g/dL (ref 6.0–8.3)

## 2019-09-17 LAB — HEMOGLOBIN A1C: Hgb A1c MFr Bld: 10.3 % — ABNORMAL HIGH (ref 4.6–6.5)

## 2019-09-17 LAB — MICROALBUMIN / CREATININE URINE RATIO
Creatinine,U: 42.8 mg/dL
Microalb Creat Ratio: 3 mg/g (ref 0.0–30.0)
Microalb, Ur: 1.3 mg/dL (ref 0.0–1.9)

## 2019-09-17 LAB — LIPID PANEL
Cholesterol: 203 mg/dL — ABNORMAL HIGH (ref 0–200)
HDL: 41.6 mg/dL (ref >39.00)
Total CHOL/HDL Ratio: 5
Triglycerides: 408 mg/dL — ABNORMAL HIGH (ref 0.0–149.0)

## 2019-09-17 LAB — PSA, MEDICARE: PSA: 0.6 ng/ml (ref 0.10–4.00)

## 2019-09-17 LAB — LDL CHOLESTEROL, DIRECT: Direct LDL: 88 mg/dL

## 2019-09-17 NOTE — Progress Notes (Signed)
OFFICE VISIT  09/17/2019   CC:  Chief Complaint  Patient presents with  . Follow-up    RCI, pt is fasting   HPI:    Patient is a 68 y.o. Caucasian male who presents for 6 mo f/u DM 2, HTN, HLD.  DM: last visit I started him on low dose NPH bid. However, he did not start this med.  Says glucoses 130s-140s typically--checks fasting and random.  Says never >140 since I saw him last. Feet: no burning, tingling or numbness in feet.  HTN:  taking HCTZ.  No home bp monitoring.  Hx of prostate nodule, PSA has been normal.  Initially followed by urol, last f/u visit per pt's estimate-->he doesn't know.  We decided to do DRE and PSA today.  HLD: tolerating statin.  Says he is eating better diet consistently.  ROS: no CP, no SOB, no wheezing, no cough, no dizziness, no HAs, no rashes, no melena/hematochezia.  No polyuria or polydipsia.  No myalgias or arthralgias.   Past Medical History:  Diagnosis Date  . Adenomatous colon polyp 04/2006; 2018   2007 Dr. Lajoyce Corners; 2018 Dr. Stefano Gaul 04/2022.  Marland Kitchen Chronic renal insufficiency, stage II (mild) 2017   CrCl 60s  . Diabetes mellitus   . Diverticulosis of colon    hospitalized for diverticulitis in remote past; also had episode 08/27/14  . Fatty liver 2015   Noted on noncontrast CT done for eval for kidney stones  . History of ETT    LOW RISK/NEG ETT 02/2011 w/Dr. Doreatha Lew  . History of hemorrhoids   . Hyperlipidemia    Crestor caused leg cramps  . Hypertension   . Left foot pain    medial: post tibial tendonitis---podiatrist did sheath injection 03/2018 and 02/2019  . Nephrolithiasis    Saw urologist in [place] past (?[facility]). Left mid ureteral stone 01/2016--ED visit.  . Prostate nodule 08/2018   Referred to urol (PSA was normal/unchanged from prev year).  . Renal cyst, left    hemorrhagic vs proteinacious (last f/u CT 2007 showed stability)--Dr. Lajoyce Corners.  . Shoulder pain, right     Injection on 2 consec mo at [place] (Dr. Marlou Sa) 2012     Past Surgical History:  Procedure Laterality Date  . CHALAZION EXCISION  summer 2015  . COLONOSCOPY W/ POLYPECTOMY  04/2006; 04/2017   2007: Adenomatous polyps x 2; recall 5 yrs (Dr. Lajoyce Corners).  Repeat 04/2017 (Dr. Philomena Doheny adenoma x1 + sigmoid diverticulosis).  Recall 5 yrs.  . ESOPHAGOGASTRODUODENOSCOPY  2008   Esophagitis/gastritis/GERD.  H pylori and Barrett's NEG.  Marland Kitchen HEMORRHOID SURGERY    . NOSE SURGERY     deviated septum    Outpatient Medications Prior to Visit  Medication Sig Dispense Refill  . atorvastatin (LIPITOR) 80 MG tablet TAKE 1 TABLET BY MOUTH DAILY 90 tablet 1  . blood glucose meter kit and supplies KIT Dispense based on patient and insurance preference. Use to check blood sugar twice daily. Dx E11.9 1 each 0  . cetirizine (ZYRTEC) 10 MG tablet Take 10 mg by mouth daily.     . Continuous Blood Gluc Receiver (FREESTYLE LIBRE 14 DAY READER) DEVI 1 each by Does not apply route daily. 1 Device 0  . Continuous Blood Gluc Sensor (FREESTYLE LIBRE 14 DAY SENSOR) MISC 1 each by Does not apply route 3 (three) times daily. 3 each 5  . Glucose Blood (FREESTYLE LITE TEST VI)     . hydrochlorothiazide (HYDRODIURIL) 25 MG tablet TAKE 1 TABLET BY MOUTH  DAILY 90 tablet 1  . Insulin Pen Needle (PEN NEEDLES) 31G X 5 MM MISC 1 each by Does not apply route daily. 100 each 5  . Insulin Syringe-Needle U-100 25G X 5/8" 1 ML MISC Use to inject insulin twice a day 100 each 6  . JARDIANCE 25 MG TABS tablet TAKE 1 TABLET BY MOUTH EVERY DAY 90 tablet 0  . Lancets (ONETOUCH DELICA PLUS [id number]) MISC USE TO CHECK BLOOD SUGAR TWICE DAILY 100 each 11  . metFORMIN (GLUCOPHAGE) 1000 MG tablet TAKE 1 TABLET BY MOUTH TWICE DAILY 180 tablet 1  . ONE TOUCH ULTRA TEST test strip TEST SUGAR DAILY 100 each 11  . pantoprazole (PROTONIX) 40 MG tablet TAKE 1 TABLET BY MOUTH DAILY 90 tablet 1  . VICTOZA 18 MG/3ML SOPN INJECT 0.3 MLS(1.8MG TOTAL) INTO THE SKIN DAILY 27 mL 1  . NOVOFINE 32G X 6 MM MISC USE AS  DIRECTED (Patient not taking: Reported on 09/17/2019) 100 each 0  . insulin NPH Human (NOVOLIN N) 100 UNIT/ML injection 7 units SQ with morning meal and 4 units SQ with supper meal. (Patient not taking: Reported on 09/17/2019) 3 mL 2   No facility-administered medications prior to visit.     No Known Allergies  ROS As per HPI  PE: Blood pressure 115/77, pulse 68, temperature 98.5 F (36.9 C), temperature source Temporal, resp. rate 16, height _0  (1.702 m), weight 196 lb 3.2 oz (89 kg), SpO2 94 %. Body mass index is 30.73 kg/m.  Gen: Alert, well appearing.  Patient is oriented to person, place, time, and situation. AFFECT: pleasant, lucid thought and speech. Foot exam - bilateral normal; no swelling, tenderness or skin or vascular lesions. Color and temperature is normal. Sensation is intact. Peripheral pulses are palpable. Toenails are normal. Rectal exam: negative without mass, lesions or tenderness, PROSTATE EXAM: smooth and symmetric without nodules or tenderness.    LABS:  Lab Results  Component Value Date   TSH 1.22 08/28/2018   Lab Results  Component Value Date   WBC 5.1 08/28/2018   HGB 15.2 08/28/2018   HCT 43.7 08/28/2018   MCV 89.4 08/28/2018   PLT 182.0 08/28/2018   Lab Results  Component Value Date   CREATININE 1.16 02/27/2019   BUN 28 (H) 02/27/2019   NA 138 02/27/2019   K 4.0 02/27/2019   CL 96 02/27/2019   CO2 32 02/27/2019   Lab Results  Component Value Date   ALT 18 08/28/2018   AST 14 08/28/2018   ALKPHOS 56 08/28/2018   BILITOT 1.0 08/28/2018   Lab Results  Component Value Date   CHOL 182 08/28/2018   Lab Results  Component Value Date   HDL 38.30 (L) 08/28/2018   Lab Results  Component Value Date   LDLCALC 99 11/26/2012   Lab Results  Component Value Date   TRIG 262.0 (H) 08/28/2018   Lab Results  Component Value Date   CHOLHDL 5 08/28/2018   Lab Results  Component Value Date   PSA 0.59 08/28/2018   PSA 0.51 08/25/2017    PSA 0.51 07/11/2016   Lab Results  Component Value Date   HGBA1C 9.8 (H) 02/27/2019   IMPRESSION AND PLAN:  1) DM 2: improved control as per glucoses he reports to me today. Continue victoza, jardiance, and metformin. HbA1c, urine microalb/cr, and BMET today. Feet exam normal today.  2) HTN: The current medical regimen is effective;  continue present plan and medications. Lytes/cr today.  3) Hyperlipidemia,  mixed: tolerating statin.  FLP and hepatic panel today.  4) Hx of prostate nodule, with normal PSAs.  Unclear urol f/u per pt's recollection (I can't find anything in media section of EMR either). DRE today-->normal prostate.  PSA today.  An After Visit Summary was printed and given to the patient.  FOLLOW UP: Return in about 3 months (around 12/17/2019) for routine chronic illness f/u.  Signed:  Crissie Sickles, MD           09/17/2019

## 2019-09-17 NOTE — Addendum Note (Signed)
Addended by: Deveron Furlong D on: 09/17/2019 10:22 AM   Modules accepted: Orders

## 2019-09-18 ENCOUNTER — Other Ambulatory Visit: Payer: Self-pay | Admitting: Family Medicine

## 2019-09-18 MED ORDER — FENOFIBRATE 145 MG PO TABS: 145.0000 mg | ORAL_TABLET | Freq: Every day | ORAL | 3 refills | Status: DC

## 2019-09-18 MED ORDER — INSULIN NPH (HUMAN) (ISOPHANE) 100 UNIT/ML ~~LOC~~ SUSP: SUBCUTANEOUS | 2 refills | Status: DC

## 2019-09-30 ENCOUNTER — Other Ambulatory Visit: Payer: Self-pay

## 2019-09-30 MED ORDER — METFORMIN HCL 1000 MG PO TABS: 1000.0000 mg | ORAL_TABLET | Freq: Two times a day (BID) | ORAL | 0 refills | Status: DC

## 2019-10-08 ENCOUNTER — Other Ambulatory Visit: Payer: Self-pay

## 2019-10-08 ENCOUNTER — Encounter (HOSPITAL_BASED_OUTPATIENT_CLINIC_OR_DEPARTMENT_OTHER): Payer: Self-pay | Admitting: *Deleted

## 2019-10-08 ENCOUNTER — Emergency Department (HOSPITAL_BASED_OUTPATIENT_CLINIC_OR_DEPARTMENT_OTHER)
Admission: EM | Admit: 2019-10-08 | Discharge: 2019-10-08 | Disposition: A | Discharge status: Home / Self Care | Payer: Medicare Other | Attending: Emergency Medicine | Admitting: Emergency Medicine

## 2019-10-08 DIAGNOSIS — Z794 Long term (current) use of insulin: Secondary | ICD-10-CM | POA: Diagnosis not present

## 2019-10-08 DIAGNOSIS — L03221 Cellulitis of neck: Secondary | ICD-10-CM | POA: Diagnosis not present

## 2019-10-08 DIAGNOSIS — Z79899 Other long term (current) drug therapy: Secondary | ICD-10-CM | POA: Insufficient documentation

## 2019-10-08 DIAGNOSIS — E1122 Type 2 diabetes mellitus with diabetic chronic kidney disease: Secondary | ICD-10-CM | POA: Insufficient documentation

## 2019-10-08 DIAGNOSIS — N182 Chronic kidney disease, stage 2 (mild): Secondary | ICD-10-CM | POA: Diagnosis not present

## 2019-10-08 DIAGNOSIS — I129 Hypertensive chronic kidney disease with stage 1 through stage 4 chronic kidney disease, or unspecified chronic kidney disease: Secondary | ICD-10-CM | POA: Diagnosis not present

## 2019-10-08 DIAGNOSIS — R221 Localized swelling, mass and lump, neck: Secondary | ICD-10-CM | POA: Diagnosis present

## 2019-10-08 MED ORDER — JARDIANCE 25 MG PO TABS: 25.0000 mg | ORAL_TABLET | Freq: Every day | ORAL | 1 refills | Status: DC

## 2019-10-08 MED ORDER — DOXYCYCLINE HYCLATE 100 MG PO CAPS: 100.0000 mg | ORAL_CAPSULE | Freq: Two times a day (BID) | ORAL | 0 refills | Status: DC

## 2019-10-08 NOTE — ED Triage Notes (Signed)
Abscess to the back of his neck x 2 days.

## 2019-10-08 NOTE — Discharge Instructions (Addendum)
Take doxycycline twice daily as prescribed.  Make sure to wear sunscreen and protect yourself from the sun when taking this medication as it can cause you to be more likely to burn.  Use warm compresses to the area several times daily to help elicit drainage.  Please return here or see your doctor in 2 days for recheck.  You may ultimately need this area incised and drained.  Please return the emergency department sooner if you develop increasing pain, redness, swelling, spread of pain, or fevers.

## 2019-10-08 NOTE — ED Provider Notes (Signed)
[facility] EMERGENCY DEPARTMENT Provider Note   CSN: [id number] Arrival date & time: 10/08/19  1659     History   Chief Complaint Chief Complaint  Patient presents with  . Abscess    HPI Shaun Knight is a 68 y.o. male with history of hypertension, hyperlipidemia, abscess, diabetes who presents with red, swollen area to his neck that developed 3 days ago.  Patient reports he recently had a haircut prior.  He has had similar spots before.  He has not tried anything at home for symptoms.  He denies any fevers, but his wife does note that he was wrapped in a blanket last evening.  He denies any difficulty moving his neck.  He does report some of the pain is shooting toward his R ear.     HPI  Past Medical History:  Diagnosis Date  . Adenomatous colon polyp 04/2006; 2018   2007 Dr. Lajoyce Corners; 2018 Dr. Stefano Gaul 04/2022.  Marland Kitchen Chronic renal insufficiency, stage II (mild) 2017   CrCl 60s  . Diabetes mellitus   . Diverticulosis of colon    hospitalized for diverticulitis in remote past; also had episode 08/27/14  . Fatty liver 2015   Noted on noncontrast CT done for eval for kidney stones  . History of ETT    LOW RISK/NEG ETT 02/2011 w/Dr. Doreatha Lew  . History of hemorrhoids   . Hyperlipidemia    Crestor caused leg cramps  . Hypertension   . Left foot pain    medial: post tibial tendonitis---podiatrist did sheath injection 03/2018 and 02/2019  . Nephrolithiasis    Saw urologist in [place] past (?[facility]). Left mid ureteral stone 01/2016--ED visit.  . Prostate nodule 08/2018   Referred to urol (PSA was normal/unchanged from prev year).  . Renal cyst, left    hemorrhagic vs proteinacious (last f/u CT 2007 showed stability)--Dr. Lajoyce Corners.  . Shoulder pain, right     Injection on 2 consec mo at [place] (Dr. Marlou Sa) 2012    Patient Active Problem List   Diagnosis Date Noted  . Diabetes mellitus without complication ([place]) 41/74/0814  . Essential hypertension 12/02/2014  .  Hyperlipidemia 08/11/2014  . Travel advice encounter 08/11/2014  . Gastroenteritis 05/20/2013  . Pain of right heel 10/04/2012  . Type II or unspecified type diabetes mellitus without mention of complication, uncontrolled 07/27/2012  . Adenomatous colon polyp   . GERD (gastroesophageal reflux disease) 06/25/2012  . Erectile dysfunction 04/27/2012  . History of kidney stones 03/29/2012  . Insomnia 12/04/2011  . PLANTAR WART 02/07/2011  . DM 02/04/2011  . Hyperlipemia, mixed 02/04/2011    Past Surgical History:  Procedure Laterality Date  . CHALAZION EXCISION  summer 2015  . COLONOSCOPY W/ POLYPECTOMY  04/2006; 04/2017   2007: Adenomatous polyps x 2; recall 5 yrs (Dr. Lajoyce Corners).  Repeat 04/2017 (Dr. Philomena Doheny adenoma x1 + sigmoid diverticulosis).  Recall 5 yrs.  . ESOPHAGOGASTRODUODENOSCOPY  2008   Esophagitis/gastritis/GERD.  H pylori and Barrett's NEG.  Marland Kitchen HEMORRHOID SURGERY    . NOSE SURGERY     deviated septum        Home Medications    Prior to Admission medications   Medication Sig Start Date End Date Taking? Authorizing Provider  atorvastatin (LIPITOR) 80 MG tablet TAKE 1 TABLET BY MOUTH DAILY 06/17/19   McGowen, Adrian Blackwater, MD  blood glucose meter kit and supplies KIT Dispense based on patient and insurance preference. Use to check blood sugar twice daily. Dx E11.9 03/13/19   McGowen,  Adrian Blackwater, MD  cetirizine (ZYRTEC) 10 MG tablet Take 10 mg by mouth daily.     [provider]  Continuous Blood Gluc Receiver (FREESTYLE LIBRE 14 DAY READER) DEVI 1 each by Does not apply route daily. 09/14/18   McGowen, Adrian Blackwater, MD  Continuous Blood Gluc Sensor (FREESTYLE LIBRE 14 DAY SENSOR) MISC 1 each by Does not apply route 3 (three) times daily. 09/14/18   McGowen, Adrian Blackwater, MD  doxycycline (VIBRAMYCIN) 100 MG capsule Take 1 capsule (100 mg total) by mouth 2 (two) times daily. 10/08/19   Lain Tetterton, Bea Graff, PA-C  empagliflozin (JARDIANCE) 25 MG TABS tablet Take 25 mg by mouth daily.  10/08/19   McGowen, Adrian Blackwater, MD  fenofibrate (TRICOR) 145 MG tablet Take 1 tablet (145 mg total) by mouth daily. 09/18/19   McGowen, Adrian Blackwater, MD  Glucose Blood (FREESTYLE LITE TEST VI)  06/22/12   [provider]  hydrochlorothiazide (HYDRODIURIL) 25 MG tablet TAKE 1 TABLET BY MOUTH DAILY 08/20/19   McGowen, Adrian Blackwater, MD  insulin NPH Human (NOVOLIN N) 100 UNIT/ML injection 7 units SQ with morning meal and 4 units SQ with supper meal. 09/18/19   McGowen, Adrian Blackwater, MD  Insulin Pen Needle (PEN NEEDLES) 31G X 5 MM MISC 1 each by Does not apply route daily. 02/27/19   McGowen, Adrian Blackwater, MD  Insulin Syringe-Needle U-100 25G X 5/8" 1 ML MISC Use to inject insulin twice a day 08/29/17   McGowen, Adrian Blackwater, MD  Lancets (ONETOUCH DELICA PLUS [id number]) MISC USE TO CHECK BLOOD SUGAR TWICE DAILY 05/07/19   McGowen, Adrian Blackwater, MD  metFORMIN (GLUCOPHAGE) 1000 MG tablet Take 1 tablet (1,000 mg total) by mouth 2 (two) times daily. 09/30/19   McGowen, Adrian Blackwater, MD  NOVOFINE 32G X 6 MM MISC USE AS DIRECTED Patient not taking: Reported on 09/17/2019 07/30/18   Tammi Sou, MD  ONE [facility] ULTRA TEST test strip TEST SUGAR DAILY 08/02/17   Tammi Sou, MD  pantoprazole (PROTONIX) 40 MG tablet TAKE 1 TABLET BY MOUTH DAILY 08/27/19   McGowen, Adrian Blackwater, MD  VICTOZA 18 MG/3ML SOPN INJECT 0.3 MLS(1.8MG TOTAL) INTO THE SKIN DAILY 08/05/19   McGowen, Adrian Blackwater, MD    Family History Family History  Problem Relation Age of Onset  . Diabetes Mother   . Hypertension Mother   . Coronary artery disease Mother   . Diabetes Father   . Hypertension Father   . Coronary artery disease Father   . Lymphoma Father        non-hodgkin  . Obesity Daughter   . Cancer Paternal Grandmother        unknown    Social History Social History   Tobacco Use  . Smoking status: Never Smoker  . Smokeless tobacco: Never Used  Substance Use Topics  . Alcohol use: No  . Drug use: No     Allergies   Patient has no known  allergies.   Review of Systems Review of Systems  Constitutional: Negative for fever.  Skin: Positive for wound.     Physical Exam Updated Vital Signs BP 137/84   Pulse 81   Temp 99.1 F (37.3 C) (Oral)   Resp 20   Ht _0  (1.702 m)   Wt 90.3 kg   SpO2 100%   BMI 31.17 kg/m   Physical Exam Vitals signs and nursing note reviewed.  Constitutional:      General: He is not in acute distress.  Appearance: He is well-developed. He is not diaphoretic.  HENT:     Head: Normocephalic and atraumatic.     Mouth/Throat:     Pharynx: No oropharyngeal exudate.  Eyes:     General: No scleral icterus.       Right eye: No discharge.        Left eye: No discharge.     Conjunctiva/sclera: Conjunctivae normal.     Pupils: Pupils are equal, round, and reactive to light.  Neck:     Musculoskeletal: Normal range of motion and neck supple.     Thyroid: No thyromegaly.   Cardiovascular:     Rate and Rhythm: Normal rate and regular rhythm.     Heart sounds: Normal heart sounds. No murmur. No friction rub. No gallop.   Pulmonary:     Effort: Pulmonary effort is normal. No respiratory distress.     Breath sounds: Normal breath sounds. No stridor. No wheezing or rales.  Abdominal:     General: Bowel sounds are normal. There is no distension.     Palpations: Abdomen is soft.     Tenderness: There is no abdominal tenderness. There is no guarding or rebound.  Lymphadenopathy:     Cervical: No cervical adenopathy.  Skin:    General: Skin is warm and dry.     Coloration: Skin is not pale.     Findings: No rash.  Neurological:     Mental Status: He is alert.     Coordination: Coordination normal.      ED Treatments / Results  Labs (all labs ordered are listed, but only abnormal results are displayed) Labs Reviewed - No data to display  EKG None  Radiology No results found.  Procedures Ultrasound ED Soft Tissue  Date/Time: 10/08/2019 5:45 PM Performed by: Frederica Kuster, PA-C Authorized by: Frederica Kuster, PA-C   Procedure details:    Indications: localization of abscess and evaluate for cellulitis     Transverse view:  Visualized   Longitudinal view:  Visualized   Images: archived   Location:    Location: neck     Side:  Midline Findings:     no abscess present    cellulitis present    no foreign body present   (including critical care time)  Medications Ordered in ED Medications - No data to display   Initial Impression / Assessment and Plan / ED Course  I have reviewed the triage vital signs and the nursing notes.  Pertinent labs & imaging results that were available during my care of the patient were reviewed by me and considered in my medical decision making (see chart for details).        Patient with cellulitis with probable developing abscess in the posterior neck.  No fluid collection seen on ultrasound.  Will treat with doxycycline and warm compresses at this time.  Return in 2 days for wound recheck and probable I&D.  Patient given reasons to return sooner.  He understands and agrees with plan.  Patient vital stable throughout ED course and discharged in satisfactory condition.  Patient also evaluated by my attending, Dr. Laverta Baltimore, who guided the patient's management and agrees with plan.  Final Clinical Impressions(s) / ED Diagnoses   Final diagnoses:  Cellulitis of neck    ED Discharge Orders         Ordered    doxycycline (VIBRAMYCIN) 100 MG capsule  2 times daily     10/08/19 1743  Frederica Kuster, PA-C 10/08/19 1746    Margette Fast, MD 10/11/19 [phone]

## 2019-12-30 ENCOUNTER — Other Ambulatory Visit: Payer: Self-pay

## 2019-12-30 MED ORDER — METFORMIN HCL 1000 MG PO TABS: 1000.0000 mg | ORAL_TABLET | Freq: Two times a day (BID) | ORAL | 0 refills | Status: DC

## 2020-01-06 ENCOUNTER — Other Ambulatory Visit: Payer: Self-pay

## 2020-01-07 ENCOUNTER — Encounter: Payer: Self-pay | Admitting: Family Medicine

## 2020-01-07 ENCOUNTER — Ambulatory Visit (INDEPENDENT_AMBULATORY_CARE_PROVIDER_SITE_OTHER): Payer: Medicare Other | Admitting: Family Medicine

## 2020-01-07 VITALS — BP 119/82 | HR 66 | Temp 98.2°F | Resp 16 | Ht 67.0 in | Wt 197.8 lb

## 2020-01-07 DIAGNOSIS — L309 Dermatitis, unspecified: Secondary | ICD-10-CM

## 2020-01-07 MED ORDER — FLUTICASONE PROPIONATE 0.05 % EX CREA: TOPICAL_CREAM | Freq: Two times a day (BID) | CUTANEOUS | 1 refills | Status: DC

## 2020-01-07 MED ORDER — METHYLPREDNISOLONE ACETATE 40 MG/ML IJ SUSP
40.0000 mg | Freq: Once | INTRAMUSCULAR | Status: AC
  Administered 2020-01-07: 10:00:00 40 mg via INTRAMUSCULAR

## 2020-01-07 MED ORDER — FENOFIBRATE 145 MG PO TABS: 145.0000 mg | ORAL_TABLET | Freq: Every day | ORAL | 3 refills | Status: DC

## 2020-01-07 MED ORDER — VICTOZA 18 MG/3ML ~~LOC~~ SOPN: PEN_INJECTOR | SUBCUTANEOUS | 3 refills | Status: DC

## 2020-01-07 MED ORDER — PANTOPRAZOLE SODIUM 40 MG PO TBEC: DELAYED_RELEASE_TABLET | ORAL | 3 refills | Status: DC

## 2020-01-07 MED ORDER — ATORVASTATIN CALCIUM 80 MG PO TABS: 80.0000 mg | ORAL_TABLET | Freq: Every day | ORAL | 3 refills | Status: DC

## 2020-01-07 NOTE — Progress Notes (Signed)
OFFICE VISIT  01/07/2020   CC:  Chief Complaint  Patient presents with  . Rash    x over a week, used neosporin and cream used for athlete's foot.   HPI:    Patient is a 69 y.o. Caucasian male who presents for rash. Onset over a week ago, located on back, L ankle, a few on L arm. Very itchy.  Sprayed on antifungal and brief soothing occurs but doesn't last and rash unchanged. Applying neosporin. Has scratched each spot and it makes it worse.   Past Medical History:  Diagnosis Date  . Adenomatous colon polyp 04/2006; 2018   2007 Dr. Lajoyce Corners; 2018 Dr. Stefano Gaul 04/2022.  Marland Kitchen Chronic renal insufficiency, stage II (mild) 2017   CrCl 60s  . Diabetes mellitus   . Diverticulosis of colon    hospitalized for diverticulitis in remote past; also had episode 08/27/14  . Fatty liver 2015   Noted on noncontrast CT done for eval for kidney stones  . History of ETT    LOW RISK/NEG ETT 02/2011 w/Dr. Doreatha Lew  . History of hemorrhoids   . Hyperlipidemia    Crestor caused leg cramps  . Hypertension   . Left foot pain    medial: post tibial tendonitis---podiatrist did sheath injection 03/2018 and 02/2019  . Nephrolithiasis    Saw urologist in [place] past (?[facility]). Left mid ureteral stone 01/2016--ED visit.  . Prostate nodule 08/2018   Referred to urol (PSA was normal/unchanged from prev year).  . Renal cyst, left    hemorrhagic vs proteinacious (last f/u CT 2007 showed stability)--Dr. Lajoyce Corners.  . Shoulder pain, right     Injection on 2 consec mo at [facility] (Dr. Marlou Sa) 2012    Past Surgical History:  Procedure Laterality Date  . CHALAZION EXCISION  summer 2015  . COLONOSCOPY W/ POLYPECTOMY  04/2006; 04/2017   2007: Adenomatous polyps x 2; recall 5 yrs (Dr. Lajoyce Corners).  Repeat 04/2017 (Dr. Philomena Doheny adenoma x1 + sigmoid diverticulosis).  Recall 5 yrs.  . ESOPHAGOGASTRODUODENOSCOPY  2008   Esophagitis/gastritis/GERD.  H pylori and Barrett's NEG.  Marland Kitchen HEMORRHOID SURGERY    . NOSE SURGERY     deviated septum    Outpatient Medications Prior to Visit  Medication Sig Dispense Refill  . cetirizine (ZYRTEC) 10 MG tablet Take 10 mg by mouth daily.     . empagliflozin (JARDIANCE) 25 MG TABS tablet Take 25 mg by mouth daily. 90 tablet 1  . fenofibrate (TRICOR) 145 MG tablet Take 1 tablet (145 mg total) by mouth daily. 30 tablet 3  . hydrochlorothiazide (HYDRODIURIL) 25 MG tablet TAKE 1 TABLET BY MOUTH DAILY 90 tablet 1  . Lancets (ONETOUCH DELICA PLUS [id number]) MISC USE TO CHECK BLOOD SUGAR TWICE DAILY 100 each 11  . metFORMIN (GLUCOPHAGE) 1000 MG tablet Take 1 tablet (1,000 mg total) by mouth 2 (two) times daily. 180 tablet 0  . VICTOZA 18 MG/3ML SOPN INJECT 0.3 MLS(1.8MG TOTAL) INTO THE SKIN DAILY 27 mL 1  . pantoprazole (PROTONIX) 40 MG tablet TAKE 1 TABLET BY MOUTH DAILY 90 tablet 1  . atorvastatin (LIPITOR) 80 MG tablet TAKE 1 TABLET BY MOUTH DAILY (Patient not taking: Reported on 01/07/2020) 90 tablet 1  . blood glucose meter kit and supplies KIT Dispense based on patient and insurance preference. Use to check blood sugar twice daily. Dx E11.9 (Patient not taking: Reported on 01/07/2020) 1 each 0  . Continuous Blood Gluc Receiver (FREESTYLE LIBRE 14 DAY READER) DEVI 1 each by Does not  apply route daily. (Patient not taking: Reported on 01/07/2020) 1 Device 0  . Continuous Blood Gluc Sensor (FREESTYLE LIBRE 14 DAY SENSOR) MISC 1 each by Does not apply route 3 (three) times daily. (Patient not taking: Reported on 01/07/2020) 3 each 5  . Glucose Blood (FREESTYLE LITE TEST VI)     . insulin NPH Human (NOVOLIN N) 100 UNIT/ML injection 7 units SQ with morning meal and 4 units SQ with supper meal. (Patient not taking: Reported on 01/07/2020) 3 mL 2  . Insulin Pen Needle (PEN NEEDLES) 31G X 5 MM MISC 1 each by Does not apply route daily. (Patient not taking: Reported on 01/07/2020) 100 each 5  . Insulin Syringe-Needle U-100 25G X 5/8" 1 ML MISC Use to inject insulin twice a day (Patient not taking:  Reported on 01/07/2020) 100 each 6  . NOVOFINE 32G X 6 MM MISC USE AS DIRECTED (Patient not taking: Reported on 09/17/2019) 100 each 0  . ONE TOUCH ULTRA TEST test strip TEST SUGAR DAILY (Patient not taking: Reported on 01/07/2020) 100 each 11  . doxycycline (VIBRAMYCIN) 100 MG capsule Take 1 capsule (100 mg total) by mouth 2 (two) times daily. (Patient not taking: Reported on 01/07/2020) 20 capsule 0   No facility-administered medications prior to visit.    No Known Allergies  ROS As per HPI  PE: Blood pressure 119/82, pulse 66, temperature 98.2 F (36.8 C), temperature source Temporal, resp. rate 16, height '5\' 7"'  (1.702 m), weight 197 lb 12.8 oz (89.7 kg), SpO2 97 %. Gen: Alert, well appearing.  Patient is oriented to person, place, time, and situation. AFFECT: pleasant, lucid thought and speech. Skin: scattered small patches of pinkish rash, borders not very well demarcated, some superficial desquamation/flaking.  No signif excoriation. Located on trunk and arms and legs sparsely, both sides of body involved. No vesicles, pustules, hives, or nodules.  LABS:    Chemistry      Component Value Date/Time   NA 138 09/17/2019 1001   K 3.4 (L) 09/17/2019 1001   CL 95 (L) 09/17/2019 1001   CO2 31 09/17/2019 1001   BUN 19 09/17/2019 1001   CREATININE 1.09 09/17/2019 1001      Component Value Date/Time   CALCIUM 9.5 09/17/2019 1001   ALKPHOS 60 09/17/2019 1001   AST 18 09/17/2019 1001   ALT 23 09/17/2019 1001   BILITOT 0.9 09/17/2019 1001     Lab Results  Component Value Date   HGBA1C 10.3 (H) 09/17/2019   Lab Results  Component Value Date   CHOL 203 (H) 09/17/2019   HDL 41.60 09/17/2019   LDLCALC 99 11/26/2012   LDLDIRECT 88.0 09/17/2019   TRIG (H) 09/17/2019    408.0 Triglyceride is over 400; calculations on Lipids are invalid.   CHOLHDL 5 09/17/2019   Lab Results  Component Value Date   WBC 5.1 08/28/2018   HGB 15.2 08/28/2018   HCT 43.7 08/28/2018   MCV 89.4  08/28/2018   PLT 182.0 08/28/2018   Lab Results  Component Value Date   PSA 0.60 09/17/2019   PSA 0.59 08/28/2018   PSA 0.51 08/25/2017    IMPRESSION AND PLAN:  1) Scatted eczematous lesions superimposed on xerosis. Depo medrol 53m IM in office today. Cutivate 0.05% to apply to affected areas bid until resolved. Moisturize skin more aggressively.  An After Visit Summary was printed and given to the patient.  FOLLOW UP: Return in about 4 weeks (around 02/04/2020) for routine chronic illness f/u.  Signed:  Crissie Sickles, MD           01/07/2020

## 2020-01-07 NOTE — Addendum Note (Signed)
Addended by: Deveron Furlong D on: 01/07/2020 10:05 AM   Modules accepted: Orders

## 2020-01-08 ENCOUNTER — Ambulatory Visit: Payer: Medicare Other | Attending: Internal Medicine

## 2020-01-08 DIAGNOSIS — Z23 Encounter for immunization: Secondary | ICD-10-CM | POA: Insufficient documentation

## 2020-01-08 NOTE — Progress Notes (Signed)
   Covid-19 Vaccination Clinic  Name:  Shaun Knight    MRN: [id number] DOB: 1951/08/29  01/08/2020  Shaun Knight was observed post Covid-19 immunization for 15 minutes without incidence. He was provided with Vaccine Information Sheet and instruction to access the V-Safe system.   Shaun Knight was instructed to call 911 with any severe reactions post vaccine: Marland Kitchen Difficulty breathing  . Swelling of your face and throat  . A fast heartbeat  . A bad rash all over your body  . Dizziness and weakness    Immunizations Administered    Name Date Dose VIS Date Route   Pfizer COVID-19 Vaccine 01/08/2020 11:01 AM 0.3 mL 11/29/2019 Intramuscular   Manufacturer: [place]   Lot: [id number]   [place]: [id number]

## 2020-01-14 ENCOUNTER — Other Ambulatory Visit: Payer: Self-pay

## 2020-01-14 ENCOUNTER — Ambulatory Visit (INDEPENDENT_AMBULATORY_CARE_PROVIDER_SITE_OTHER): Payer: Medicare Other | Admitting: Family Medicine

## 2020-01-14 ENCOUNTER — Encounter: Payer: Self-pay | Admitting: Family Medicine

## 2020-01-14 VITALS — BP 130/84 | HR 82 | Temp 97.6°F | Resp 16 | Ht 67.0 in | Wt 194.8 lb

## 2020-01-14 DIAGNOSIS — M7918 Myalgia, other site: Secondary | ICD-10-CM

## 2020-01-14 DIAGNOSIS — M25511 Pain in right shoulder: Secondary | ICD-10-CM | POA: Diagnosis not present

## 2020-01-14 MED ORDER — METHYLPREDNISOLONE ACETATE 40 MG/ML IJ SUSP: 40.0000 mg | Freq: Once | INTRAMUSCULAR | Status: DC

## 2020-01-14 MED ORDER — METHYLPREDNISOLONE ACETATE 40 MG/ML IJ SUSP
20.0000 mg | Freq: Once | INTRAMUSCULAR | Status: AC
  Administered 2020-01-14: 20 mg via INTRAMUSCULAR

## 2020-01-14 MED ORDER — CYCLOBENZAPRINE HCL 10 MG PO TABS: 10.0000 mg | ORAL_TABLET | Freq: Three times a day (TID) | ORAL | 0 refills | Status: DC | PRN

## 2020-01-14 MED ORDER — HYDROCODONE-ACETAMINOPHEN 5-325 MG PO TABS: 1.0000 | ORAL_TABLET | ORAL | 0 refills | Status: DC | PRN

## 2020-01-14 MED ORDER — METHYLPREDNISOLONE ACETATE 40 MG/ML IJ SUSP
20.0000 mg | Freq: Once | INTRAMUSCULAR | Status: AC
  Administered 2020-01-14: 10:00:00 20 mg via INTRAMUSCULAR

## 2020-01-14 NOTE — Addendum Note (Signed)
Addended by: Deveron Furlong D on: 01/14/2020 10:23 AM   Modules accepted: Orders

## 2020-01-14 NOTE — Progress Notes (Signed)
OFFICE VISIT  01/14/2020   CC:  Chief Complaint  Patient presents with  . Arm Pain    right side after 1st covid vaccine on 1/20   HPI:    Patient is a 69 y.o. Caucasian male who presents for right arm swelling and pain. He got his 1st injection of covid 19 vaccine on 01/08/20.  Onset of pain in R shoulder 3 d/a w/out any preceding overuse or strain or trauma. No swelling or pain in arm in the couple of days prior after getting covid vaccine. The pain is bad, can't sleep at night.  No fever, chills, malaise.  Has been taking some ibup and tylenol. No swelling is noted in upper arm where he got the covid injection.   Says the pain feels like it is located in muscles of R side of neck and trap area--not deltoid or actual shoulder joint.  Past Medical History:  Diagnosis Date  . Adenomatous colon polyp 04/2006; 2018   2007 Dr. Lajoyce Corners; 2018 Dr. Stefano Gaul 04/2022.  Marland Kitchen Chronic renal insufficiency, stage II (mild) 2017   CrCl 60s  . Diabetes mellitus   . Diverticulosis of colon    hospitalized for diverticulitis in remote past; also had episode 08/27/14  . Fatty liver 2015   Noted on noncontrast CT done for eval for kidney stones  . History of ETT    LOW RISK/NEG ETT 02/2011 w/Dr. Doreatha Lew  . History of hemorrhoids   . Hyperlipidemia    Crestor caused leg cramps  . Hypertension   . Left foot pain    medial: post tibial tendonitis---podiatrist did sheath injection 03/2018 and 02/2019  . Nephrolithiasis    Saw urologist in [place] past (?[facility]). Left mid ureteral stone 01/2016--ED visit.  . Prostate nodule 08/2018   Referred to urol (PSA was normal/unchanged from prev year).  . Renal cyst, left    hemorrhagic vs proteinacious (last f/u CT 2007 showed stability)--Dr. Lajoyce Corners.  . Shoulder pain, right     Injection on 2 consec mo at [place] (Dr. Marlou Sa) 2012    Past Surgical History:  Procedure Laterality Date  . CHALAZION EXCISION  summer 2015  . COLONOSCOPY W/ POLYPECTOMY   04/2006; 04/2017   2007: Adenomatous polyps x 2; recall 5 yrs (Dr. Lajoyce Corners).  Repeat 04/2017 (Dr. Philomena Doheny adenoma x1 + sigmoid diverticulosis).  Recall 5 yrs.  . ESOPHAGOGASTRODUODENOSCOPY  2008   Esophagitis/gastritis/GERD.  H pylori and Barrett's NEG.  Marland Kitchen HEMORRHOID SURGERY    . NOSE SURGERY     deviated septum    Outpatient Medications Prior to Visit  Medication Sig Dispense Refill  . atorvastatin (LIPITOR) 80 MG tablet Take 1 tablet (80 mg total) by mouth daily. 90 tablet 3  . fluticasone (CUTIVATE) 0.05 % cream Apply topically 2 (two) times daily. 60 g 1  . Glucose Blood (FREESTYLE LITE TEST VI)     . hydrochlorothiazide (HYDRODIURIL) 25 MG tablet TAKE 1 TABLET BY MOUTH DAILY 90 tablet 1  . Lancets (ONETOUCH DELICA PLUS [id number]) MISC USE TO CHECK BLOOD SUGAR TWICE DAILY 100 each 11  . liraglutide (VICTOZA) 18 MG/3ML SOPN INJECT 0.3 MLS(1.8MG TOTAL) INTO THE SKIN DAILY 27 mL 3  . metFORMIN (GLUCOPHAGE) 1000 MG tablet Take 1 tablet (1,000 mg total) by mouth 2 (two) times daily. 180 tablet 0  . pantoprazole (PROTONIX) 40 MG tablet TAKE 1 TABLET BY MOUTH DAILY 90 tablet 3  . blood glucose meter kit and supplies KIT Dispense based on patient and insurance preference.  Use to check blood sugar twice daily. Dx E11.9 (Patient not taking: Reported on 01/07/2020) 1 each 0  . cetirizine (ZYRTEC) 10 MG tablet Take 10 mg by mouth daily.     . Continuous Blood Gluc Receiver (FREESTYLE LIBRE 14 DAY READER) DEVI 1 each by Does not apply route daily. (Patient not taking: Reported on 01/07/2020) 1 Device 0  . Continuous Blood Gluc Sensor (FREESTYLE LIBRE 14 DAY SENSOR) MISC 1 each by Does not apply route 3 (three) times daily. (Patient not taking: Reported on 01/07/2020) 3 each 5  . empagliflozin (JARDIANCE) 25 MG TABS tablet Take 25 mg by mouth daily. (Patient not taking: Reported on 01/14/2020) 90 tablet 1  . fenofibrate (TRICOR) 145 MG tablet Take 1 tablet (145 mg total) by mouth daily. (Patient not  taking: Reported on 01/14/2020) 90 tablet 3  . insulin NPH Human (NOVOLIN N) 100 UNIT/ML injection 7 units SQ with morning meal and 4 units SQ with supper meal. (Patient not taking: Reported on 01/07/2020) 3 mL 2  . Insulin Pen Needle (PEN NEEDLES) 31G X 5 MM MISC 1 each by Does not apply route daily. (Patient not taking: Reported on 01/07/2020) 100 each 5  . Insulin Syringe-Needle U-100 25G X 5/8" 1 ML MISC Use to inject insulin twice a day (Patient not taking: Reported on 01/07/2020) 100 each 6  . NOVOFINE 32G X 6 MM MISC USE AS DIRECTED (Patient not taking: Reported on 09/17/2019) 100 each 0  . ONE TOUCH ULTRA TEST test strip TEST SUGAR DAILY (Patient not taking: Reported on 01/07/2020) 100 each 11   No facility-administered medications prior to visit.    No Known Allergies  ROS As per HPI  PE: Blood pressure 130/84, pulse 82, temperature 97.6 F (36.4 C), temperature source Temporal, resp. rate 16, height '5\' 7"'  (1.702 m), weight 194 lb 12.8 oz (88.4 kg), SpO2 97 %. Gen: Alert, well appearing.  Patient is oriented to person, place, time, and situation. AFFECT: pleasant, lucid thought and speech. R arm and shoulder w/out rash, warmth, or swelling.  No signif TTP over deltoid. R AC and peri-acromial regions w/out significant TTP.  Speeds and yergason's neg. ROM testing ->fully intact, with pain elicited in the areas noted in HPI when [facility] testing is done. Neg drop arm, neg impingement.  UE strength 5/5 prox dist. He has signif TTP in upper trap/suprascap region on right and this extends into R posterolateral neck soft tissues.  Two trigger points palpable in R upper scap/trapezius region.  LABS:    Chemistry      Component Value Date/Time   NA 138 09/17/2019 1001   K 3.4 (L) 09/17/2019 1001   CL 95 (L) 09/17/2019 1001   CO2 31 09/17/2019 1001   BUN 19 09/17/2019 1001   CREATININE 1.09 09/17/2019 1001      Component Value Date/Time   CALCIUM 9.5 09/17/2019 1001   ALKPHOS 60 09/17/2019  1001   AST 18 09/17/2019 1001   ALT 23 09/17/2019 1001   BILITOT 0.9 09/17/2019 1001     Lab Results  Component Value Date   WBC 5.1 08/28/2018   HGB 15.2 08/28/2018   HCT 43.7 08/28/2018   MCV 89.4 08/28/2018   PLT 182.0 08/28/2018   Lab Results  Component Value Date   HGBA1C 10.3 (H) 09/17/2019    IMPRESSION AND PLAN:  Myofascial pain, trigger points. Question whether possible side effect from recent covid injection, but it was delayed 2 d from the time of  the injection, so low suspicion. I really don't think this is coming from [facility] or intra-articular location. Discussed trigger point injections today and he was agreeable to this.  Consent obtained. Also rx'd flexeril 83m, 1 tid prn, #30, no RF. Ibup 6076mwith food bid x 7d.  Vicodin 5/32531m1-2 q4h prn, #30.  Trigger point injection x 2 performed at the sites of maximal tenderness using 1/2 ml 1% plain Lidocaine and 1/2 ml 72m20m depo medrol. This was well tolerated, no immediate complications.  An After Visit Summary was printed and given to the patient.  FOLLOW UP: if not improving as expected Keep appt set for 02/04/20.  Signed:  PhilCrissie Sickles           01/14/2020

## 2020-01-14 NOTE — Patient Instructions (Signed)
Take 3 advil with food TWICE per day for 7 days. Apply ice to the area of pain for 20 min 2 times per day for 1 week.

## 2020-01-23 ENCOUNTER — Other Ambulatory Visit: Payer: Self-pay

## 2020-01-23 ENCOUNTER — Ambulatory Visit (INDEPENDENT_AMBULATORY_CARE_PROVIDER_SITE_OTHER): Payer: Medicare Other | Admitting: Family Medicine

## 2020-01-23 ENCOUNTER — Encounter: Payer: Self-pay | Admitting: Family Medicine

## 2020-01-23 VITALS — BP 110/78 | HR 88 | Temp 97.6°F | Resp 16 | Ht 67.0 in | Wt 194.8 lb

## 2020-01-23 DIAGNOSIS — M542 Cervicalgia: Secondary | ICD-10-CM

## 2020-01-23 DIAGNOSIS — M25511 Pain in right shoulder: Secondary | ICD-10-CM | POA: Diagnosis not present

## 2020-01-23 DIAGNOSIS — M79601 Pain in right arm: Secondary | ICD-10-CM | POA: Diagnosis not present

## 2020-01-23 MED ORDER — OXYCODONE HCL 5 MG PO TABS: ORAL_TABLET | ORAL | 0 refills | Status: DC

## 2020-01-23 NOTE — Progress Notes (Signed)
OFFICE VISIT  01/23/2020   CC:  Chief Complaint  Patient presents with  . Right arm pain    ongoing since last visit on 1/26   HPI:    Patient is a 69 y.o. Caucasian male who presents for right arm pain. I saw him for this problem 9 days ago. A/P as of that visit: "Myofascial pain, trigger points. Question whether possible side effect from recent covid injection, but it was delayed 2 d from the time of the injection, so low suspicion. I really don't think this is coming from [facility] or intra-articular location. Discussed trigger point injections today and he was agreeable to this.  Consent obtained. Also rx'd flexeril 96m, 1 tid prn, #30, no RF. Ibup 6061mwith food bid x 7d.  Vicodin 5/32542m1-2 q4h prn, #30.  Trigger point injection x 2 performed at the sites of maximal tenderness using 1/2 ml 1% plain Lidocaine and 1/2 ml 64m72m depo medrol. This was well tolerated, no immediate complications."  Interim hx: Everything same, vicodin and flexeril no help. Can't sleep at night.   Pain starts in R side of neck and extends down R trap/shoulder all the way down arm to his hand.  Heat to the area no help. No arm weakness or paresthesias. No rash or swelling.   The pain is made worse when he extends his head straight back--he feels the pain focused in right SCM region and it goes to the right shoulder and arm from there.  Past Medical History:  Diagnosis Date  . Adenomatous colon polyp 04/2006; 2018   2007 Dr. Orr;Lajoyce Corners18 Dr. JacoStefano Gaul023.  . ChMarland Kitchenonic renal insufficiency, stage II (mild) 2017   CrCl 60s  . Diabetes mellitus   . Diverticulosis of colon    hospitalized for diverticulitis in remote past; also had episode 08/27/14  . Fatty liver 2015   Noted on noncontrast CT done for eval for kidney stones  . History of ETT    LOW RISK/NEG ETT 02/2011 w/Dr. TennDoreatha LewHistory of hemorrhoids   . Hyperlipidemia    Crestor caused leg cramps  . Hypertension   . Left foot pain    medial: post tibial tendonitis---podiatrist did sheath injection 03/2018 and 02/2019  . Nephrolithiasis    [place] Left mid ureteral stone 01/2016--ED visit.  . Prostate nodule 08/2018   Referred to urol (PSA was normal/unchanged from prev year).  . Renal cyst, left    hemorrhagic vs proteinacious (last f/u CT 2007 showed stability)--Dr. Orr.Lajoyce Corners Shoulder pain, right     [place] on 2 consec mo at [place]. DeanMarlou Sa12    Past Surgical History:  Procedure Laterality Date  . CHALAZION EXCISION  summer 2015  . COLONOSCOPY W/ POLYPECTOMY  04/2006; 04/2017   2007: Adenomatous polyps x 2; recall 5 yrs (Dr. Orr)Lajoyce CornersRepeat 04/2017 (Dr. JacoPhilomena Dohenynoma x1 + sigmoid diverticulosis).  Recall 5 yrs.  . ESOPHAGOGASTRODUODENOSCOPY  2008   Esophagitis/gastritis/GERD.  H pylori and Barrett's NEG.  . HEMarland KitchenORRHOID SURGERY    . NOSE SURGERY     deviated septum    Outpatient Medications Prior to Visit  Medication Sig Dispense Refill  . atorvastatin (LIPITOR) 80 MG tablet Take 1 tablet (80 mg total) by mouth daily. 90 tablet 3  . cyclobenzaprine (FLEXERIL) 10 MG tablet Take 1 tablet (10 mg total) by mouth 3 (three) times daily as needed for muscle spasms. 30 tablet 0  . hydrochlorothiazide (HYDRODIURIL)  25 MG tablet TAKE 1 TABLET BY MOUTH DAILY 90 tablet 1  . Lancets (ONETOUCH DELICA PLUS [id number]) MISC USE TO CHECK BLOOD SUGAR TWICE DAILY 100 each 11  . liraglutide (VICTOZA) 18 MG/3ML SOPN INJECT 0.3 MLS(1.8MG TOTAL) INTO THE SKIN DAILY 27 mL 3  . metFORMIN (GLUCOPHAGE) 1000 MG tablet Take 1 tablet (1,000 mg total) by mouth 2 (two) times daily. 180 tablet 0  . pantoprazole (PROTONIX) 40 MG tablet TAKE 1 TABLET BY MOUTH DAILY 90 tablet 3  . HYDROcodone-acetaminophen (NORCO/VICODIN) 5-325 MG tablet Take 1-2 tablets by mouth every 4 (four) hours as needed for moderate pain. 30 tablet 0  . blood glucose meter kit and supplies KIT Dispense based on patient and  insurance preference. Use to check blood sugar twice daily. Dx E11.9 (Patient not taking: Reported on 01/07/2020) 1 each 0  . cetirizine (ZYRTEC) 10 MG tablet Take 10 mg by mouth daily.     . Continuous Blood Gluc Receiver (FREESTYLE LIBRE 14 DAY READER) DEVI 1 each by Does not apply route daily. (Patient not taking: Reported on 01/07/2020) 1 Device 0  . Continuous Blood Gluc Sensor (FREESTYLE LIBRE 14 DAY SENSOR) MISC 1 each by Does not apply route 3 (three) times daily. (Patient not taking: Reported on 01/07/2020) 3 each 5  . fenofibrate (TRICOR) 145 MG tablet Take 1 tablet (145 mg total) by mouth daily. (Patient not taking: Reported on 01/14/2020) 90 tablet 3  . fluticasone (CUTIVATE) 0.05 % cream Apply topically 2 (two) times daily. (Patient not taking: Reported on 01/23/2020) 60 g 1  . Glucose Blood (FREESTYLE LITE TEST VI)     . Insulin Pen Needle (PEN NEEDLES) 31G X 5 MM MISC 1 each by Does not apply route daily. (Patient not taking: Reported on 01/07/2020) 100 each 5  . Insulin Syringe-Needle U-100 25G X 5/8" 1 ML MISC Use to inject insulin twice a day (Patient not taking: Reported on 01/07/2020) 100 each 6  . NOVOFINE 32G X 6 MM MISC USE AS DIRECTED (Patient not taking: Reported on 09/17/2019) 100 each 0  . ONE TOUCH ULTRA TEST test strip TEST SUGAR DAILY (Patient not taking: Reported on 01/07/2020) 100 each 11  . empagliflozin (JARDIANCE) 25 MG TABS tablet Take 25 mg by mouth daily. (Patient not taking: Reported on 01/14/2020) 90 tablet 1  . insulin NPH Human (NOVOLIN N) 100 UNIT/ML injection 7 units SQ with morning meal and 4 units SQ with supper meal. (Patient not taking: Reported on 01/07/2020) 3 mL 2   No facility-administered medications prior to visit.    No Known Allergies  ROS As per HPI  PE: Blood pressure 110/78, pulse 88, temperature 97.6 F (36.4 C), temperature source Temporal, resp. rate 16, height '5\' 7"'  (1.702 m), weight 194 lb 12.8 oz (88.4 kg), SpO2 97 %. Gen: Alert, well  appearing.  Patient is oriented to person, place, time, and situation. AFFECT: pleasant, lucid thought and speech.   LABS:    Chemistry      Component Value Date/Time   NA 138 09/17/2019 1001   K 3.4 (L) 09/17/2019 1001   CL 95 (L) 09/17/2019 1001   CO2 31 09/17/2019 1001   BUN 19 09/17/2019 1001   CREATININE 1.09 09/17/2019 1001      Component Value Date/Time   CALCIUM 9.5 09/17/2019 1001   ALKPHOS 60 09/17/2019 1001   AST 18 09/17/2019 1001   ALT 23 09/17/2019 1001   BILITOT 0.9 09/17/2019 1001     Lab  Results  Component Value Date   HGBA1C 10.3 (H) 09/17/2019    IMPRESSION AND PLAN:  Acute right sided neck pain with shoulder and arm pain-->question of radicular component. I thought he had myofascial pain with trigger points last visit but injections no help. His pain is pretty bad, not helped by flexeril and vicodin. Shoulder etiology low suspicion.  Side effect from covid vaccine possible but unlikely. Will ask sports medicine for help.  An After Visit Summary was printed and given to the patient.  FOLLOW UP: Return if symptoms worsen or fail to improve.  Signed:  Crissie Sickles, MD           01/23/2020

## 2020-01-27 ENCOUNTER — Other Ambulatory Visit: Payer: Self-pay

## 2020-01-27 ENCOUNTER — Ambulatory Visit (INDEPENDENT_AMBULATORY_CARE_PROVIDER_SITE_OTHER): Payer: Medicare Other | Admitting: Family Medicine

## 2020-01-27 ENCOUNTER — Encounter: Payer: Self-pay | Admitting: Family Medicine

## 2020-01-27 VITALS — BP 112/78 | HR 81 | Ht 67.0 in | Wt 190.6 lb

## 2020-01-27 DIAGNOSIS — M62838 Other muscle spasm: Secondary | ICD-10-CM | POA: Diagnosis not present

## 2020-01-27 DIAGNOSIS — S43421A Sprain of right rotator cuff capsule, initial encounter: Secondary | ICD-10-CM | POA: Diagnosis not present

## 2020-01-27 MED ORDER — PREDNISONE 5 MG (48) PO TBPK: ORAL_TABLET | ORAL | 0 refills | Status: DC

## 2020-01-27 NOTE — Patient Instructions (Addendum)
Thank you for coming in today. Try to reduce the oxycodone dose if able.  Attend PT.  Do the exercises Cloyde Reams Showed you today.  If you have worsening pain after the next COVID-19 shot ok to fill and take the prednisone for inflammation and pain.  Recheck if not improving.   Please perform the exercise program that we have prepared for you and gone over in detail on a daily basis.  In addition to the handout you were provided you can access your program through: [email or web address]   Your unique program code is:  [id number]

## 2020-01-27 NOTE — Progress Notes (Signed)
Subjective:    CC: Neck and R shoulder pain  I, Molly Weber, LAT, ATC, am serving as scribe for Dr. Lynne Leader.  HPI: Pt is a 69 y/o male presenting w/ c/o R-sided neck, upper trap, R shoulder and arm pain since late January.  Pt states that he got his Covid vaccine on 01/08/20 and began having his current symptoms about a week after getting his vaccine.  He has seen his PCP 2x since the start of his symptoms and most recently had trigger point injection son 01/23/20.  Since his last visit w/ his PCP on 01/23/20, pt reports no change in his symptoms and states that the majority of his pain is currently located to the R upper trap and R lateral neck.  He still is taking oxycodone for pain control.  He notes with the oxycodone he is feeling pretty good and able to do most of what he wants to do with shoulder.  Neck pain: Yes, R-sided neck Radiating R UE pain: Yes. Intermittently into the R arm to the elbow Numbness/tingling: No R UE weakness: No Mechanical shoulder symptoms: No Aggravating factors: Cervical extension Treatments tried: Trigger point injections; Flexeril 10 mg tid; Hydrocodone-acetaminophen 5-325 mg; Oxycodone 5 mg; heat  Pertinent review of Systems: No fevers or chills  Relevant historical information: History of diabetes.   Objective:    Vitals:   01/27/20 0852  BP: 112/78  Pulse: 81  SpO2: 94%   General: Well Developed, well nourished, and in no acute distress.   MSK:  C-spine: Normal-appearing Normal motion. Tender palpation right trapezius. Upper extremity strength to deltoid, triceps biceps wrist and finger extension flexion and finger abduction adduction and grip 5/5 bilaterally. Sensation intact bilateral extremities.  Right shoulder: Normal-appearing Nontender. Normal motion. Strength: Abduction 5/5.  External rotation 4+/5.  Internal rotation 5/5. Positive empty can test negative Hawkins and Neer's test. Negative Yergason's and speeds  test.  Left shoulder: Normal-appearing Normal motion Nontender. Normal strength. Negative impingement and biceps tendinitis testing.  Pulses cap refill and sensation intact bilateral extremities.    Impression and Recommendations:    Assessment and Plan: 69 y.o. male with  Right shoulder pain.  Pain predominantly now in trapezius.  Patient describes pain occurring in lateral upper arm following COVID-19 vaccination.  I suspect he had some rotator cuff tendinitis that is now mostly resolved leaving more of a trapezius picture.  He still is having fair amount of pain requiring oxycodone to control.  However in clinic today after taking some oxycodone this morning he is feeling pretty good.  After discussion plan for trial of physical therapy.  Home exercise program taught in clinic today by ATC/PT. Check back if not improving especially following physical therapy.  Of note patient has his second COVID-19 vaccine in 2 days.  If he has worsening or similar pain following the vaccine I have prescribed prednisone that he can fill and start taking.  Cautioned against elevated blood sugar with prednisone and history of diabetes.Marland Kitchen    PDMP reviewed during this encounter. Orders Placed This Encounter  Procedures  . Ambulatory referral to Physical Therapy    Referral Priority:   Routine    Referral Type:   Physical Medicine    Referral Reason:   Specialty Services Required    Requested Specialty:   Physical Therapy    Number of Visits Requested:   1   Meds ordered this encounter  Medications  . predniSONE (STERAPRED UNI-PAK 48 TAB) 5 MG (  48) TBPK tablet    Sig: 12 day dosepack po    Dispense:  48 tablet    Refill:  0    Discussed warning signs or symptoms. Please see discharge instructions. Patient expresses understanding.   The above documentation has been reviewed and is accurate and complete Lynne Leader

## 2020-01-29 ENCOUNTER — Ambulatory Visit: Payer: Medicare Other | Attending: Internal Medicine

## 2020-01-29 DIAGNOSIS — Z23 Encounter for immunization: Secondary | ICD-10-CM | POA: Insufficient documentation

## 2020-01-29 NOTE — Progress Notes (Signed)
   Covid-19 Vaccination Clinic  Name:  Shaun Knight    MRN: [id number] DOB: 1951/10/21  01/29/2020  Shaun Knight was observed post Covid-19 immunization for 15 minutes without incidence. He was provided with Vaccine Information Sheet and instruction to access the V-Safe system.   Shaun Knight was instructed to call 911 with any severe reactions post vaccine: Marland Kitchen Difficulty breathing  . Swelling of your face and throat  . A fast heartbeat  . A bad rash all over your body  . Dizziness and weakness    Immunizations Administered    Name Date Dose VIS Date Route   Pfizer COVID-19 Vaccine 01/29/2020  3:26 PM 0.3 mL 11/29/2019 Intramuscular   Manufacturer: [place]   Lot: [id number]   [place]: [id number]

## 2020-02-03 ENCOUNTER — Telehealth: Payer: Self-pay | Admitting: Family Medicine

## 2020-02-03 NOTE — Telephone Encounter (Signed)
Called pt to confirm appt. Pt states he is fine and doesn't see the need for appointment. Pt wanted me to cancel. I cancelled appt for 02/04/20 @ 8:30am with Dr. Anitra Lauth

## 2020-02-03 NOTE — Telephone Encounter (Signed)
FYI  Please see below

## 2020-02-03 NOTE — Telephone Encounter (Signed)
Noted  

## 2020-02-04 ENCOUNTER — Ambulatory Visit: Payer: Medicare Other | Admitting: Family Medicine

## 2020-02-17 ENCOUNTER — Other Ambulatory Visit: Payer: Self-pay

## 2020-02-17 MED ORDER — HYDROCHLOROTHIAZIDE 25 MG PO TABS: ORAL_TABLET | ORAL | 1 refills | Status: DC

## 2020-03-30 ENCOUNTER — Other Ambulatory Visit: Payer: Self-pay

## 2020-03-30 MED ORDER — METFORMIN HCL 1000 MG PO TABS: 1000.0000 mg | ORAL_TABLET | Freq: Two times a day (BID) | ORAL | 0 refills | Status: DC

## 2020-06-23 ENCOUNTER — Telehealth: Payer: Self-pay

## 2020-06-23 MED ORDER — METFORMIN HCL 1000 MG PO TABS: 1000.0000 mg | ORAL_TABLET | Freq: Two times a day (BID) | ORAL | 3 refills | Status: DC

## 2020-06-23 MED ORDER — ATORVASTATIN CALCIUM 80 MG PO TABS: 80.0000 mg | ORAL_TABLET | Freq: Every day | ORAL | 3 refills | Status: DC

## 2020-06-23 NOTE — Telephone Encounter (Signed)
Patient called in needing a refill request sent in he now has an appt set for 06/30/20   atorvastatin (LIPITOR) 80 MG tablet  metFORMIN (GLUCOPHAGE) 1000 MG tablet    [id number] - [place], [place] - [address]

## 2020-06-23 NOTE — Telephone Encounter (Signed)
OK, metformin and atorvastatin eRx'd.

## 2020-06-30 ENCOUNTER — Ambulatory Visit (INDEPENDENT_AMBULATORY_CARE_PROVIDER_SITE_OTHER): Payer: Medicare Other | Admitting: Family Medicine

## 2020-06-30 ENCOUNTER — Other Ambulatory Visit: Payer: Self-pay

## 2020-06-30 ENCOUNTER — Encounter: Payer: Self-pay | Admitting: Family Medicine

## 2020-06-30 VITALS — BP 116/80 | HR 67 | Temp 98.2°F | Resp 16 | Ht 67.0 in | Wt 196.6 lb

## 2020-06-30 DIAGNOSIS — L309 Dermatitis, unspecified: Secondary | ICD-10-CM | POA: Diagnosis not present

## 2020-06-30 DIAGNOSIS — I10 Essential (primary) hypertension: Secondary | ICD-10-CM | POA: Diagnosis not present

## 2020-06-30 DIAGNOSIS — E782 Mixed hyperlipidemia: Secondary | ICD-10-CM

## 2020-06-30 DIAGNOSIS — E119 Type 2 diabetes mellitus without complications: Secondary | ICD-10-CM

## 2020-06-30 LAB — HEMOGLOBIN A1C: Hgb A1c MFr Bld: 11.6 % — ABNORMAL HIGH (ref 4.6–6.5)

## 2020-06-30 LAB — COMPREHENSIVE METABOLIC PANEL
ALT: 23 U/L (ref 0–53)
AST: 19 U/L (ref 0–37)
Albumin: 4.3 g/dL (ref 3.5–5.2)
Alkaline Phosphatase: 26 U/L — ABNORMAL LOW (ref 39–117)
BUN: 18 mg/dL (ref 6–23)
CO2: 31 mEq/L (ref 19–32)
Calcium: 9.2 mg/dL (ref 8.4–10.5)
Chloride: 96 mEq/L (ref 96–112)
Creatinine, Ser: 1.15 mg/dL (ref 0.40–1.50)
GFR: 63.06 mL/min (ref >60.00)
Glucose, Bld: 268 mg/dL — ABNORMAL HIGH (ref 70–99)
Potassium: 4.2 mEq/L (ref 3.5–5.1)
Sodium: 135 mEq/L (ref 135–145)
Total Bilirubin: 0.7 mg/dL (ref 0.2–1.2)
Total Protein: 6.6 g/dL (ref 6.0–8.3)

## 2020-06-30 LAB — LIPID PANEL
Cholesterol: 235 mg/dL — ABNORMAL HIGH (ref 0–200)
HDL: 38.1 mg/dL — ABNORMAL LOW (ref >39.00)
NonHDL: 197.09
Total CHOL/HDL Ratio: 6
Triglycerides: 274 mg/dL — ABNORMAL HIGH (ref 0.0–149.0)
VLDL: 54.8 mg/dL — ABNORMAL HIGH (ref 0.0–40.0)

## 2020-06-30 LAB — CBC WITH DIFFERENTIAL/PLATELET
Basophils Absolute: 0.1 10*3/uL (ref 0.0–0.1)
Basophils Relative: 1.3 % (ref 0.0–3.0)
Eosinophils Absolute: 0.1 10*3/uL (ref 0.0–0.7)
Eosinophils Relative: 2.6 % (ref 0.0–5.0)
HCT: 42 % (ref 39.0–52.0)
Hemoglobin: 14.4 g/dL (ref 13.0–17.0)
Lymphocytes Relative: 19.3 % (ref 12.0–46.0)
Lymphs Abs: 0.9 10*3/uL (ref 0.7–4.0)
MCHC: 34.3 g/dL (ref 30.0–36.0)
MCV: 91.8 fl (ref 78.0–100.0)
Monocytes Absolute: 0.3 10*3/uL (ref 0.1–1.0)
Monocytes Relative: 6 % (ref 3.0–12.0)
Neutro Abs: 3.4 10*3/uL (ref 1.4–7.7)
Neutrophils Relative %: 70.8 % (ref 43.0–77.0)
Platelets: 198 10*3/uL (ref 150.0–400.0)
RBC: 4.58 Mil/uL (ref 4.22–5.81)
RDW: 13 % (ref 11.5–15.5)
WBC: 4.8 10*3/uL (ref 4.0–10.5)

## 2020-06-30 LAB — LDL CHOLESTEROL, DIRECT: Direct LDL: 151 mg/dL

## 2020-06-30 MED ORDER — FLUTICASONE PROPIONATE 0.05 % EX CREA: TOPICAL_CREAM | Freq: Two times a day (BID) | CUTANEOUS | 1 refills | Status: DC

## 2020-06-30 NOTE — Progress Notes (Signed)
OFFICE VISIT  06/30/2020   CC:  Chief Complaint  Patient presents with  . Follow-up    RCI, pt is fasting   HPI:    Patient is a 69 y.o. Caucasian male who presents for f/u DM 2, HTN, and mixed HLD. A/P as of last visit: "1) DM 2: improved control as per glucoses he reports to me today. Continue victoza, jardiance, and metformin. HbA1c, urine microalb/cr, and BMET today. Feet exam normal today.  2) HTN: The current medical regimen is effective;  continue present plan and medications. Lytes/cr today.  3) Hyperlipidemia, mixed: tolerating statin.  FLP and hepatic panel today.  4) Hx of prostate nodule, with normal PSAs.  Unclear urol f/u per pt's recollection (I can't find anything in media section of EMR either). DRE today-->normal prostate.  PSA today."  INTERIM HX: Last visit his HbA1c was 10.3%. I recommended he continue taking current meds AND start the insuliin that I had recommended at the time of last visit. I sent in the insulin to his pharmacy. Also, his triglycerides were significantly elevated. I recommended he stay on his atorvastatin at current dose BUT I recommended adding fenofibrate.  He did not start any insulin but he is compliant with victoza and metformin. He is not monitoring glucoses or bp but is compliant with meds and has pretty good diabetic and low Na diet.  No formal exercise but he is very active and has good energy level. Tolerating statin and fibrate.  Since getting covid vaccine he says he has had some diffuse, flaky, dry, pinkish patches of itchy rash.  Cutivate has helped.  L ear feels very full/stuffy for the last week.  Says hearing is normal.  Past Medical History:  Diagnosis Date  . Adenomatous colon polyp 04/2006; 2018   2007 Dr. Lajoyce Corners; 2018 Dr. Stefano Gaul 04/2022.  Marland Kitchen Chronic renal insufficiency, stage II (mild) 2017   CrCl 60s  . Diabetes mellitus   . Diverticulosis of colon    hospitalized for diverticulitis in remote past;  also had episode 08/27/14  . Fatty liver 2015   Noted on noncontrast CT done for eval for kidney stones  . History of ETT    LOW RISK/NEG ETT 02/2011 w/Dr. Doreatha Lew  . History of hemorrhoids   . Hyperlipidemia    Crestor caused leg cramps  . Hypertension   . Left foot pain    medial: post tibial tendonitis---podiatrist did sheath injection 03/2018 and 02/2019  . Nephrolithiasis    Saw urologist in [place] past (?[place]). Left mid ureteral stone 01/2016--ED visit.  . Prostate nodule 08/2018   Referred to urol (PSA was normal/unchanged from prev year).  . Renal cyst, left    hemorrhagic vs proteinacious (last f/u CT 2007 showed stability)--Dr. Lajoyce Corners.  . Shoulder pain, right     Injection on 2 consec mo at [place] (Dr. Marlou Sa) 2012    Past Surgical History:  Procedure Laterality Date  . CHALAZION EXCISION  summer 2015  . COLONOSCOPY W/ POLYPECTOMY  04/2006; 04/2017   2007: Adenomatous polyps x 2; recall 5 yrs (Dr. Lajoyce Corners).  Repeat 04/2017 (Dr. Philomena Doheny adenoma x1 + sigmoid diverticulosis).  Recall 5 yrs.  . ESOPHAGOGASTRODUODENOSCOPY  2008   Esophagitis/gastritis/GERD.  H pylori and Barrett's NEG.  Marland Kitchen HEMORRHOID SURGERY    . NOSE SURGERY     deviated septum    Outpatient Medications Prior to Visit  Medication Sig Dispense Refill  . atorvastatin (LIPITOR) 80 MG tablet Take 1 tablet (80 mg total)  by mouth daily. 90 tablet 3  . cetirizine (ZYRTEC) 10 MG tablet Take 10 mg by mouth daily.     . hydrochlorothiazide (HYDRODIURIL) 25 MG tablet TAKE 1 TABLET BY MOUTH DAILY 90 tablet 1  . Insulin Pen Needle (PEN NEEDLES) 31G X 5 MM MISC 1 each by Does not apply route daily. 100 each 5  . Insulin Syringe-Needle U-100 25G X 5/8" 1 ML MISC Use to inject insulin twice a day 100 each 6  . liraglutide (VICTOZA) 18 MG/3ML SOPN INJECT 0.3 MLS(1.8MG TOTAL) INTO THE SKIN DAILY 27 mL 3  . metFORMIN (GLUCOPHAGE) 1000 MG tablet Take 1 tablet (1,000 mg total) by mouth 2 (two) times daily. 180 tablet 3   . pantoprazole (PROTONIX) 40 MG tablet TAKE 1 TABLET BY MOUTH DAILY 90 tablet 3  . fluticasone (CUTIVATE) 0.05 % cream Apply topically 2 (two) times daily. 60 g 1  . blood glucose meter kit and supplies KIT Dispense based on patient and insurance preference. Use to check blood sugar twice daily. Dx E11.9 (Patient not taking: Reported on 01/07/2020) 1 each 0  . Continuous Blood Gluc Receiver (FREESTYLE LIBRE 14 DAY READER) DEVI 1 each by Does not apply route daily. (Patient not taking: Reported on 01/07/2020) 1 Device 0  . Continuous Blood Gluc Sensor (FREESTYLE LIBRE 14 DAY SENSOR) MISC 1 each by Does not apply route 3 (three) times daily. (Patient not taking: Reported on 01/07/2020) 3 each 5  . fenofibrate (TRICOR) 145 MG tablet Take 1 tablet (145 mg total) by mouth daily. (Patient not taking: Reported on 01/14/2020) 90 tablet 3  . Lancets (ONETOUCH DELICA PLUS [id number]) MISC USE TO CHECK BLOOD SUGAR TWICE DAILY (Patient not taking: Reported on 01/27/2020) 100 each 11  . ONE TOUCH ULTRA TEST test strip TEST SUGAR DAILY (Patient not taking: Reported on 01/07/2020) 100 each 11  . Glucose Blood (FREESTYLE LITE TEST VI)  (Patient not taking: Reported on 06/30/2020)    . NOVOFINE 32G X 6 MM MISC USE AS DIRECTED (Patient not taking: Reported on 09/17/2019) 100 each 0  . oxyCODONE (OXY IR/ROXICODONE) 5 MG immediate release tablet 1-2 tabs po q6h prn pain (Patient not taking: Reported on 06/30/2020) 42 tablet 0  . predniSONE (STERAPRED UNI-PAK 48 TAB) 5 MG (48) TBPK tablet 12 day dosepack po (Patient not taking: Reported on 06/30/2020) 48 tablet 0   No facility-administered medications prior to visit.    No Known Allergies  ROS As per HPI  PE: Blood pressure 116/80, pulse 67, temperature 98.2 F (36.8 C), temperature source Oral, resp. rate 16, height '5\' 7"'  (1.702 m), weight 196 lb 9.6 oz (89.2 kg), SpO2 96 %. Gen: Alert, well appearing.  Patient is oriented to person, place, time, and situation. AFFECT:  pleasant, lucid thought and speech. ENT: Bilat EACs clear, TMs clear and mobile. CV: RRR, no m/r/g.   LUNGS: CTA bilat, nonlabored resps, good aeration in all lung fields. EXT: no clubbing or cyanosis.  no edema.  Skin: faint patches of light pinkish, superficial flaky rash---barely discernable. No greasy texture. Borders not well defined.  Mostly on upper arms, lower legs, a couple spots on cheeks.  LABS:  Lab Results  Component Value Date   TSH 1.22 08/28/2018   Lab Results  Component Value Date   WBC 5.1 08/28/2018   HGB 15.2 08/28/2018   HCT 43.7 08/28/2018   MCV 89.4 08/28/2018   PLT 182.0 08/28/2018   Lab Results  Component Value Date   CREATININE  1.09 09/17/2019   BUN 19 09/17/2019   NA 138 09/17/2019   K 3.4 (L) 09/17/2019   CL 95 (L) 09/17/2019   CO2 31 09/17/2019   Lab Results  Component Value Date   ALT 23 09/17/2019   AST 18 09/17/2019   ALKPHOS 60 09/17/2019   BILITOT 0.9 09/17/2019   Lab Results  Component Value Date   CHOL 203 (H) 09/17/2019   Lab Results  Component Value Date   HDL 41.60 09/17/2019   Lab Results  Component Value Date   LDLCALC 99 11/26/2012   Lab Results  Component Value Date   TRIG (H) 09/17/2019    408.0 Triglyceride is over 400; calculations on Lipids are invalid.   Lab Results  Component Value Date   CHOLHDL 5 09/17/2019   Lab Results  Component Value Date   PSA 0.60 09/17/2019   PSA 0.59 08/28/2018   PSA 0.51 08/25/2017   Lab Results  Component Value Date   HGBA1C 10.3 (H) 09/17/2019   IMPRESSION AND PLAN:  1) L ear fullness: normal exam today.  No hearing impairment. Reassured.  2) Eczematous dermatitis vs seborrheic dermatitis:  Onset after covid vaccine ?? Contine prn cutivate 0.05%.  3) DM 2: historically not well controlled. Has been resistant to starting insulin.  4) HTN: The current medical regimen is effective;  continue present plan and medications.  5) HLD: compliant with/tolerating  meds.  LABS today: FLP, CMET, A1c, CBC.  An After Visit Summary was printed and given to the patient.  FOLLOW UP: Return in about 6 months (around 12/31/2020) for routine chronic illness f/u.  Signed:  Crissie Sickles, MD           06/30/2020

## 2020-08-14 ENCOUNTER — Other Ambulatory Visit: Payer: Self-pay | Admitting: Family Medicine

## 2020-09-17 ENCOUNTER — Telehealth: Payer: Self-pay

## 2020-09-17 NOTE — Telephone Encounter (Signed)
Patient's wife is calling in stating that they did a -at home covid test- which came back positive, wife is wondering if he can get an infusion, seeming how he is a diabetic

## 2020-09-17 NOTE — Telephone Encounter (Signed)
Patient needs virtual appt with PCP to further discuss. Please assist with scheduling, thanks.

## 2020-09-17 NOTE — Telephone Encounter (Signed)
Unable to LVM with patient to get scheduled

## 2020-09-21 ENCOUNTER — Encounter: Payer: Self-pay | Admitting: Family Medicine

## 2020-09-21 ENCOUNTER — Telehealth (INDEPENDENT_AMBULATORY_CARE_PROVIDER_SITE_OTHER): Payer: Medicare Other | Admitting: Family Medicine

## 2020-09-21 ENCOUNTER — Other Ambulatory Visit: Payer: Self-pay

## 2020-09-21 DIAGNOSIS — U071 COVID-19: Secondary | ICD-10-CM | POA: Diagnosis not present

## 2020-09-21 DIAGNOSIS — Z20822 Contact with and (suspected) exposure to covid-19: Secondary | ICD-10-CM | POA: Diagnosis not present

## 2020-09-21 NOTE — Progress Notes (Signed)
Virtual Visit via Video Note  I connected with pt on 09/21/20 at  1:30 PM EDT by a video enabled telemedicine application and verified that I am speaking with the correct person using two identifiers.  Location patient: home Location provider:work or home office Persons participating in the virtual visit: patient, provider  I discussed the limitations of evaluation and management by telemedicine and the availability of in person appointments. The patient expressed understanding and agreed to proceed.  Telemedicine visit is a necessity given the COVID-19 restrictions in place at the current time.  HPI: 69 y/o WM being seen today for "covid positive". He is completely asymptomatic.  His son had covid illness within the last couple weeks but has recovered now. Imri, his wife, and his mother in law decided to get tested, even though they are not sick. He got a "home test" 5 days ago and he states it showed positive.  Wife's test neg.  He's unsure about mother in law's test result. He has had covid vaccine.   ROS: See pertinent positives and negatives per HPI.  Past Medical History:  Diagnosis Date  . Adenomatous colon polyp 04/2006; 2018   2007 Dr. Lajoyce Corners; 2018 Dr. Stefano Gaul 04/2022.  Marland Kitchen Chronic renal insufficiency, stage II (mild) 2017   CrCl 60s  . Diabetes mellitus   . Diverticulosis of colon    hospitalized for diverticulitis in remote past; also had episode 08/27/14  . Fatty liver 2015   Noted on noncontrast CT done for eval for kidney stones  . History of ETT    LOW RISK/NEG ETT 02/2011 w/Dr. Doreatha Lew  . History of hemorrhoids   . Hyperlipidemia    Crestor caused leg cramps  . Hypertension   . Left foot pain    medial: post tibial tendonitis---podiatrist did sheath injection 03/2018 and 02/2019  . Nephrolithiasis    Saw urologist in [place] past ([facility]). Left mid ureteral stone 01/2016--ED visit.  . Prostate nodule 08/2018   Referred to urol (PSA was normal/unchanged from  prev year).  . Renal cyst, left    hemorrhagic vs proteinacious (last f/u CT 2007 showed stability)--Dr. Lajoyce Corners.  . Shoulder pain, right     Injection on 2 consec mo at [facility] (Dr. Marlou Sa) 2012    Past Surgical History:  Procedure Laterality Date  . CHALAZION EXCISION  summer 2015  . COLONOSCOPY W/ POLYPECTOMY  04/2006; 04/2017   2007: Adenomatous polyps x 2; recall 5 yrs (Dr. Lajoyce Corners).  Repeat 04/2017 (Dr. Philomena Doheny adenoma x1 + sigmoid diverticulosis).  Recall 5 yrs.  . ESOPHAGOGASTRODUODENOSCOPY  2008   Esophagitis/gastritis/GERD.  H pylori and Barrett's NEG.  Marland Kitchen HEMORRHOID SURGERY    . NOSE SURGERY     deviated septum     Current Outpatient Medications:  .  atorvastatin (LIPITOR) 80 MG tablet, Take 1 tablet (80 mg total) by mouth daily., Disp: 90 tablet, Rfl: 3 .  cetirizine (ZYRTEC) 10 MG tablet, Take 10 mg by mouth daily. , Disp: , Rfl:  .  Continuous Blood Gluc Receiver (FREESTYLE LIBRE 14 DAY READER) DEVI, 1 each by Does not apply route daily., Disp: 1 Device, Rfl: 0 .  Continuous Blood Gluc Sensor (FREESTYLE LIBRE 14 DAY SENSOR) MISC, 1 each by Does not apply route 3 (three) times daily., Disp: 3 each, Rfl: 5 .  fenofibrate (TRICOR) 145 MG tablet, Take 1 tablet (145 mg total) by mouth daily., Disp: 90 tablet, Rfl: 3 .  fluticasone (CUTIVATE) 0.05 % cream, Apply topically 2 (two) times  daily., Disp: 60 g, Rfl: 1 .  hydrochlorothiazide (HYDRODIURIL) 25 MG tablet, TAKE 1 TABLET BY MOUTH DAILY, Disp: 90 tablet, Rfl: 1 .  Insulin Pen Needle (PEN NEEDLES) 31G X 5 MM MISC, 1 each by Does not apply route daily., Disp: 100 each, Rfl: 5 .  Insulin Syringe-Needle U-100 25G X 5/8" 1 ML MISC, Use to inject insulin twice a day, Disp: 100 each, Rfl: 6 .  liraglutide (VICTOZA) 18 MG/3ML SOPN, INJECT 0.3 MLS(1.8MG  TOTAL) INTO THE SKIN DAILY, Disp: 27 mL, Rfl: 3 .  metFORMIN (GLUCOPHAGE) 1000 MG tablet, Take 1 tablet (1,000 mg total) by mouth 2 (two) times daily., Disp: 180 tablet, Rfl: 3 .   pantoprazole (PROTONIX) 40 MG tablet, TAKE 1 TABLET BY MOUTH DAILY, Disp: 90 tablet, Rfl: 3 .  Lancets (ONETOUCH DELICA PLUS [id number]) MISC, USE TO CHECK BLOOD SUGAR TWICE DAILY (Patient not taking: Reported on 01/27/2020), Disp: 100 each, Rfl: 11 .  ONE TOUCH ULTRA TEST test strip, TEST SUGAR DAILY (Patient not taking: Reported on 01/07/2020), Disp: 100 each, Rfl: 11  EXAM:  VITALS per patient if applicable:  Vitals with BMI 06/30/2020 01/27/2020 01/23/2020  Height 5\' 7"  5\' 7"  5\' 7"   Weight 196 lbs 10 oz 190 lbs 10 oz 194 lbs 13 oz  BMI 30.78 78.29 56.2  Systolic 130 865 784  Diastolic 80 78 78  Pulse 67 81 88     GENERAL: alert, oriented, appears well and in no acute distress  HEENT: atraumatic, conjunttiva clear, no obvious abnormalities on inspection of external nose and ears  NECK: normal movements of the head and neck  LUNGS: on inspection no signs of respiratory distress, breathing rate appears normal, no obvious gross SOB, gasping or wheezing  CV: no obvious cyanosis  MS: moves all visible extremities without noticeable abnormality  PSYCH/NEURO: pleasant and cooperative, no obvious depression or anxiety, speech and thought processing grossly intact  LABS: none today    Chemistry      Component Value Date/Time   NA 135 06/30/2020 0831   K 4.2 06/30/2020 0831   CL 96 06/30/2020 0831   CO2 31 06/30/2020 0831   BUN 18 06/30/2020 0831   CREATININE 1.15 06/30/2020 0831      Component Value Date/Time   CALCIUM 9.2 06/30/2020 0831   ALKPHOS 26 (L) 06/30/2020 0831   AST 19 06/30/2020 0831   ALT 23 06/30/2020 0831   BILITOT 0.7 06/30/2020 0831     Lab Results  Component Value Date   HGBA1C 11.6 (H) 06/30/2020   Lab Results  Component Value Date   CHOL 235 (H) 06/30/2020   HDL 38.10 (L) 06/30/2020   LDLCALC 99 11/26/2012   LDLDIRECT 151.0 06/30/2020   TRIG 274.0 (H) 06/30/2020   CHOLHDL 6 06/30/2020    ASSESSMENT AND PLAN:  Discussed the following assessment  and plan:  Asymptomatic, +exposure to known covid infected person, pt with covid + "home test" 5 days ago.  He has remained feeling well.   Unclear what to do in this situation.  We decided to have him come by our office and get covid PCR test to try to confirm the rapid antigen test he did. Otherwise, discussed sx's to watch for, discussed quarantine recommendations.   -we discussed possible serious and likely etiologies, options for evaluation and workup, limitations of telemedicine visit vs in person visit, treatment, treatment risks and precautions. Pt prefers to treat via telemedicine empirically rather than in person at this moment.  I discussed the assessment and treatment plan with the patient. The patient was provided an opportunity to ask questions and all were answered. The patient agreed with the plan and demonstrated an understanding of the instructions.    F/u: as needed  Signed:  Crissie Sickles, MD           09/21/2020

## 2020-09-21 NOTE — Addendum Note (Signed)
Addended by: Deveron Furlong D on: 09/21/2020 02:08 PM   Modules accepted: Orders

## 2020-09-22 DIAGNOSIS — U071 COVID-19: Secondary | ICD-10-CM | POA: Diagnosis not present

## 2020-09-22 DIAGNOSIS — Z20822 Contact with and (suspected) exposure to covid-19: Secondary | ICD-10-CM | POA: Diagnosis not present

## 2020-09-24 ENCOUNTER — Telehealth: Payer: Self-pay

## 2020-09-24 LAB — SARS-COV-2, NAA 2 DAY TAT

## 2020-09-24 LAB — NOVEL CORONAVIRUS, NAA: SARS-CoV-2, NAA: NOT DETECTED

## 2020-09-24 NOTE — Telephone Encounter (Signed)
10/4 virtual visit with Dr. Anitra Lauth.  COVID test on 10/5

## 2020-09-24 NOTE — Telephone Encounter (Signed)
Octaviano Glow, CMA  09/24/2020  1:16 PM EDT Back to Top    Verified pt understanding. Sent via email and letter

## 2020-09-24 NOTE — Telephone Encounter (Signed)
Patient is requesting his Covid results

## 2020-11-11 DIAGNOSIS — Z23 Encounter for immunization: Secondary | ICD-10-CM | POA: Diagnosis not present

## 2020-12-14 ENCOUNTER — Other Ambulatory Visit: Payer: Self-pay | Admitting: Family Medicine

## 2020-12-30 ENCOUNTER — Other Ambulatory Visit: Payer: Self-pay

## 2020-12-30 NOTE — Progress Notes (Unsigned)
Office Note 12/31/2020  CC:  Chief Complaint  Patient presents with  . Follow-up    RCI, 6 mo. Pt is fasting    HPI:  Shaun Knight is a 70 y.o. White male who is here for 6 mo f/u DM 2, HTN, and HLD. A/P as of last visit: "1) L ear fullness: normal exam today.  No hearing impairment. Reassured.  2) Eczematous dermatitis vs seborrheic dermatitis:  Onset after covid vaccine ?? Contine prn cutivate 0.05%.  3) DM 2: historically not well controlled. Has been resistant to starting insulin.  4) HTN: The current medical regimen is effective;  continue present plan and medications.  5) HLD: compliant with/tolerating meds.  LABS today: FLP, CMET, A1c, CBC."  INTERIM HX: Feeling fine, no acute complaints.  DM: last visit a1c still high (11.6%).  I recommended insulin again.  He did not want this.  I recommended he see an endocrinologist.  He said he would consider it but never got back to me about it. Taking metformin, victoza daily as rx'd. No burning, tingling, or numbness in feet.  HLD: trigs remain >250, Tchol/hdl 6.  Says compliant with atorva 80 and fenofibrate 145. I recommended referral to advanced lipid clinic last visit but he wanted to think about it and never got back to me about it.  HTN: no home bp monitoring.  Compliant with hctz.   ROS: no fevers, no CP, no SOB, no wheezing, no cough, no dizziness, no HAs, no rashes, no melena/hematochezia.  No polyuria or polydipsia.  No myalgias or arthralgias.  No focal weakness, paresthesias, or tremors.  No acute vision or hearing abnormalities. No n/v/d or abd pain.  No palpitations.    Past Medical History:  Diagnosis Date  . Adenomatous colon polyp 04/2006; 2018   2007 Dr. Lajoyce Corners; 2018 Dr. Stefano Gaul 04/2022.  Marland Kitchen Chronic renal insufficiency, stage II (mild) 2017   CrCl 60s  . Diabetes mellitus   . Diverticulosis of colon    hospitalized for diverticulitis in remote past; also had episode 08/27/14  . Fatty liver  2015   Noted on noncontrast CT done for eval for kidney stones  . History of ETT    LOW RISK/NEG ETT 02/2011 w/Dr. Doreatha Lew  . History of hemorrhoids   . Hyperlipidemia    Crestor caused leg cramps  . Hypertension   . Left foot pain    medial: post tibial tendonitis---podiatrist did sheath injection 03/2018 and 02/2019  . Nephrolithiasis    Saw urologist in [place] past (?[facility]). Left mid ureteral stone 01/2016--ED visit.  . Prostate nodule 08/2018   Referred to urol (PSA was normal/unchanged from prev year).  . Renal cyst, left    hemorrhagic vs proteinacious (last f/u CT 2007 showed stability)--Dr. Lajoyce Corners.  . Shoulder pain, right     Injection on 2 consec mo at [facility] [place] (Dr. Marlou Sa) 2012    Past Surgical History:  Procedure Laterality Date  . CHALAZION EXCISION  summer 2015  . COLONOSCOPY W/ POLYPECTOMY  04/2006; 04/2017   2007: Adenomatous polyps x 2; recall 5 yrs (Dr. Lajoyce Corners).  Repeat 04/2017 (Dr. Philomena Doheny adenoma x1 + sigmoid diverticulosis).  Recall 5 yrs.  . ESOPHAGOGASTRODUODENOSCOPY  2008   Esophagitis/gastritis/GERD.  H pylori and Barrett's NEG.  Marland Kitchen HEMORRHOID SURGERY    . NOSE SURGERY     deviated septum    Family History  Problem Relation Age of Onset  . Diabetes Mother   . Hypertension Mother   . Coronary artery disease  Mother   . Diabetes Father   . Hypertension Father   . Coronary artery disease Father   . Lymphoma Father        non-hodgkin  . Obesity Daughter   . Cancer Paternal Grandmother        unknown    Social History   Socioeconomic History  . Marital status: Married    Spouse name: Not on file  . Number of children: Not on file  . Years of education: Not on file  . Highest education level: Not on file  Occupational History  . Not on file  Tobacco Use  . Smoking status: Never Smoker  . Smokeless tobacco: Never Used  Vaping Use  . Vaping Use: Never used  Substance and Sexual Activity  . Alcohol use: No  . Drug use: No  . Sexual  activity: Yes    Partners: Female  Other Topics Concern  . Not on file  Social History Narrative   Married, 1 son and 1 daughter.   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   No T/A/Ds.   Social Determinants of Health   Financial Resource Strain: Not on file  Food Insecurity: Not on file  Transportation Needs: Not on file  Physical Activity: Not on file  Stress: Not on file  Social Connections: Not on file  Intimate Partner Violence: Not on file    Outpatient Medications Prior to Visit  Medication Sig Dispense Refill  . atorvastatin (LIPITOR) 80 MG tablet Take 1 tablet (80 mg total) by mouth daily. 90 tablet 3  . fenofibrate (TRICOR) 145 MG tablet Take 1 tablet (145 mg total) by mouth daily. 90 tablet 3  . hydrochlorothiazide (HYDRODIURIL) 25 MG tablet TAKE 1 TABLET BY MOUTH DAILY 90 tablet 1  . Insulin Pen Needle (PEN NEEDLES) 31G X 5 MM MISC 1 each by Does not apply route daily. 100 each 5  . liraglutide (VICTOZA) 18 MG/3ML SOPN INJECT 0.3 MLS(1.8MG  TOTAL) INTO THE SKIN DAILY 27 mL 3  . metFORMIN (GLUCOPHAGE) 1000 MG tablet Take 1 tablet (1,000 mg total) by mouth 2 (two) times daily. 180 tablet 3  . pantoprazole (PROTONIX) 40 MG tablet TAKE 1 TABLET BY MOUTH DAILY 90 tablet 3  . Continuous Blood Gluc Receiver (FREESTYLE LIBRE 14 DAY READER) DEVI 1 each by Does not apply route daily. (Patient not taking: Reported on 12/31/2020) 1 Device 0  . Continuous Blood Gluc Sensor (FREESTYLE LIBRE 14 DAY SENSOR) MISC 1 each by Does not apply route 3 (three) times daily. (Patient not taking: Reported on 12/31/2020) 3 each 5  . Insulin Syringe-Needle U-100 25G X 5/8" 1 ML MISC Use to inject insulin twice a day (Patient not taking: Reported on 12/31/2020) 100 each 6  . Lancets (ONETOUCH DELICA PLUS [id number]) MISC USE TO CHECK BLOOD SUGAR TWICE DAILY (Patient not taking: No sig reported) 100 each 11  . ONE TOUCH ULTRA TEST test strip TEST SUGAR DAILY  (Patient not taking: No sig reported) 100 each 11  . cetirizine (ZYRTEC) 10 MG tablet Take 10 mg by mouth daily.  (Patient not taking: Reported on 12/31/2020)    . fluticasone (CUTIVATE) 0.05 % cream Apply topically 2 (two) times daily. (Patient not taking: Reported on 12/31/2020) 60 g 1   No facility-administered medications prior to visit.    No Known Allergies  PE; Vitals with BMI 12/31/2020 06/30/2020 01/27/2020  Height 5\' 7"  5\' 7"  5\' 7"   Weight 194 lbs 196 lbs 10  oz 190 lbs 10 oz  BMI 30.38 [id number]  Systolic [id number]  Diastolic 80 80 78  Pulse 71 67 81     Gen: Alert, well appearing.  Patient is oriented to person, place, time, and situation. AFFECT: pleasant, lucid thought and speech. CV: RRR, no m/r/g.   LUNGS: CTA bilat, nonlabored resps, good aeration in all lung fields. EXT: no clubbing or cyanosis.  no edema.  Foot exam - no swelling, tenderness or skin or vascular lesions. Color and temperature is normal. Sensation is intact. Peripheral pulses are palpable. Toenails are normal.  Pertinent labs:  Lab Results  Component Value Date   TSH 1.22 08/28/2018   Lab Results  Component Value Date   WBC 4.8 06/30/2020   HGB 14.4 06/30/2020   HCT 42.0 06/30/2020   MCV 91.8 06/30/2020   PLT 198.0 06/30/2020   Lab Results  Component Value Date   CREATININE 1.15 06/30/2020   BUN 18 06/30/2020   NA 135 06/30/2020   K 4.2 06/30/2020   CL 96 06/30/2020   CO2 31 06/30/2020   Lab Results  Component Value Date   ALT 23 06/30/2020   AST 19 06/30/2020   ALKPHOS 26 (L) 06/30/2020   BILITOT 0.7 06/30/2020   Lab Results  Component Value Date   CHOL 235 (H) 06/30/2020   Lab Results  Component Value Date   HDL 38.10 (L) 06/30/2020   Lab Results  Component Value Date   LDLCALC 99 11/26/2012   Lab Results  Component Value Date   TRIG 274.0 (H) 06/30/2020   Lab Results  Component Value Date   CHOLHDL 6 06/30/2020   Lab Results  Component Value Date   PSA  0.60 09/17/2019   PSA 0.59 08/28/2018   PSA 0.51 08/25/2017   Lab Results  Component Value Date   HGBA1C 11.6 (H) 06/30/2020   ASSESSMENT AND PLAN:   1) DM, hx of poor control.  No home monitoring. H is compliant with meds at this time.  Feet exam normal today. Has declined my recommendation of insulin treatment multiple times in the past. Will rx Libre again, check glucose qid. Hba1c, urine microalb/cr, lytes/cr, GAD ab's, insulin and c-peptide levels today. Trying to get him to accept that he either needs to take insulin OR I need him to discuss things with an endocrinologist (or both).  2) HTN: good control.  Cont hctz 25mg  qd. Lytes/cr today.  3) HLD: tolerating 80mg  qd. FLP and hepatic panel today.  4) Preventative health care: Vaccines all UTD. Prostate ca screening: PSA today. Colon ca screening: hx of polyps, recall 04/2022.  An After Visit Summary was printed and given to the patient.  FOLLOW UP:  Return in about 6 months (around 06/30/2021) for routine chronic illness f/u.  Signed:  Crissie Sickles, MD           12/31/2020

## 2020-12-31 ENCOUNTER — Ambulatory Visit (INDEPENDENT_AMBULATORY_CARE_PROVIDER_SITE_OTHER): Payer: Medicare Other | Admitting: Family Medicine

## 2020-12-31 ENCOUNTER — Other Ambulatory Visit: Payer: Self-pay

## 2020-12-31 ENCOUNTER — Encounter: Payer: Self-pay | Admitting: Family Medicine

## 2020-12-31 VITALS — BP 130/80 | HR 71 | Temp 97.5°F | Resp 16 | Ht 67.0 in | Wt 194.0 lb

## 2020-12-31 DIAGNOSIS — Z23 Encounter for immunization: Secondary | ICD-10-CM | POA: Diagnosis not present

## 2020-12-31 DIAGNOSIS — I10 Essential (primary) hypertension: Secondary | ICD-10-CM | POA: Diagnosis not present

## 2020-12-31 DIAGNOSIS — E782 Mixed hyperlipidemia: Secondary | ICD-10-CM

## 2020-12-31 DIAGNOSIS — E119 Type 2 diabetes mellitus without complications: Secondary | ICD-10-CM | POA: Diagnosis not present

## 2020-12-31 DIAGNOSIS — Z125 Encounter for screening for malignant neoplasm of prostate: Secondary | ICD-10-CM | POA: Diagnosis not present

## 2020-12-31 LAB — MICROALBUMIN / CREATININE URINE RATIO
Creatinine,U: 76.5 mg/dL
Microalb Creat Ratio: 4.1 mg/g (ref 0.0–30.0)
Microalb, Ur: 3.2 mg/dL — ABNORMAL HIGH (ref 0.0–1.9)

## 2020-12-31 LAB — COMPREHENSIVE METABOLIC PANEL
ALT: 24 U/L (ref 0–53)
AST: 21 U/L (ref 0–37)
Albumin: 4.5 g/dL (ref 3.5–5.2)
Alkaline Phosphatase: 36 U/L — ABNORMAL LOW (ref 39–117)
BUN: 20 mg/dL (ref 6–23)
CO2: 31 mEq/L (ref 19–32)
Calcium: 9.5 mg/dL (ref 8.4–10.5)
Chloride: 94 mEq/L — ABNORMAL LOW (ref 96–112)
Creatinine, Ser: 1.32 mg/dL (ref 0.40–1.50)
GFR: 55.06 mL/min — ABNORMAL LOW (ref >60.00)
Glucose, Bld: 301 mg/dL — ABNORMAL HIGH (ref 70–99)
Potassium: 3.9 mEq/L (ref 3.5–5.1)
Sodium: 133 mEq/L — ABNORMAL LOW (ref 135–145)
Total Bilirubin: 0.8 mg/dL (ref 0.2–1.2)
Total Protein: 6.8 g/dL (ref 6.0–8.3)

## 2020-12-31 LAB — LIPID PANEL
Cholesterol: 225 mg/dL — ABNORMAL HIGH (ref 0–200)
HDL: 36.1 mg/dL — ABNORMAL LOW (ref >39.00)
Total CHOL/HDL Ratio: 6
Triglycerides: 427 mg/dL — ABNORMAL HIGH (ref 0.0–149.0)

## 2020-12-31 LAB — LDL CHOLESTEROL, DIRECT: Direct LDL: 127 mg/dL

## 2020-12-31 LAB — HEMOGLOBIN A1C: Hgb A1c MFr Bld: 13.2 % — ABNORMAL HIGH (ref 4.6–6.5)

## 2020-12-31 LAB — PSA, MEDICARE: PSA: 0.45 ng/ml (ref 0.10–4.00)

## 2020-12-31 MED ORDER — FREESTYLE LIBRE 14 DAY READER DEVI: 1 refills | Status: DC

## 2020-12-31 MED ORDER — FREESTYLE LIBRE 14 DAY SENSOR MISC: 5 refills | Status: DC

## 2021-01-01 ENCOUNTER — Other Ambulatory Visit: Payer: Self-pay

## 2021-01-01 DIAGNOSIS — E119 Type 2 diabetes mellitus without complications: Secondary | ICD-10-CM

## 2021-01-01 LAB — INSULIN AND C-PEPTIDE, SERUM
C-Peptide: 3.6 ng/mL (ref 1.1–4.4)
INSULIN: 11.9 u[IU]/mL (ref 2.6–24.9)

## 2021-01-01 MED ORDER — FREESTYLE LIBRE 14 DAY SENSOR MISC: 5 refills | Status: DC

## 2021-01-01 MED ORDER — FREESTYLE LIBRE 14 DAY READER DEVI: 1 refills | Status: DC

## 2021-01-03 LAB — GLUTAMIC ACID DECARBOXYLASE AUTO ABS: Glutamic Acid Decarb Ab: <5 IU/mL (ref <5)

## 2021-01-07 ENCOUNTER — Telehealth: Payer: Self-pay

## 2021-01-07 NOTE — Telephone Encounter (Signed)
Received fax from pharmacy that patient's plan does not cover this. Prior authorization can be completed by calling [phone] or changing Rx.  Please advise, thanks.

## 2021-01-07 NOTE — Telephone Encounter (Signed)
Freestyle libre 14 day reader device, sorry.

## 2021-01-07 NOTE — Telephone Encounter (Signed)
What medicine?

## 2021-01-08 ENCOUNTER — Telehealth: Payer: Self-pay

## 2021-01-08 MED ORDER — TRESIBA FLEXTOUCH 100 UNIT/ML ~~LOC~~ SOPN: PEN_INJECTOR | SUBCUTANEOUS | 6 refills | Status: DC

## 2021-01-08 NOTE — Addendum Note (Signed)
Addended by: Tammi Sou on: 01/08/2021 12:16 PM   Modules accepted: Orders

## 2021-01-08 NOTE — Telephone Encounter (Signed)
Pls do prior [place]

## 2021-01-08 NOTE — Telephone Encounter (Signed)
-----   Message from Tammi Sou, MD sent at 01/08/2021 10:16 AM EST ----- Latest labs showed his diabetes control continues to worsen. We have to change treatment.  INSULIN IS A MUST for him at this time. If he is not agreeable to starting insulin today then pls ask him to make appt with me to discuss things. Let me know-thx

## 2021-01-08 NOTE — Telephone Encounter (Signed)
Spoke with pt regarding labs and instructions. Pt agrees to starting insulin. Please send to [place]

## 2021-01-08 NOTE — Telephone Encounter (Signed)
OK, I eRx'd a once-daily insulin, fingers crossed regarding insurance coverage, etc!   Continue victoza and metformin like he has been doing. Check glucose every morning before eating and again about 2 hours after a meal. F/u 2-3 wks in office to review and see how things are going.-thx

## 2021-01-08 NOTE — Telephone Encounter (Signed)
Attempted to contact pt and could not LVM

## 2021-01-11 MED ORDER — LEVEMIR FLEXTOUCH 100 UNIT/ML ~~LOC~~ SOPN: PEN_INJECTOR | SUBCUTANEOUS | 6 refills | Status: DC

## 2021-01-11 NOTE — Telephone Encounter (Signed)
OK. I just now sent in levemir insulin rx to see if that's covered better by his insurance.

## 2021-01-11 NOTE — Addendum Note (Signed)
Addended by: Tammi Sou on: 01/11/2021 02:55 PM   Modules accepted: Orders

## 2021-01-11 NOTE — Telephone Encounter (Signed)
Pt is aware of Rx change. Will call if any complications.

## 2021-01-11 NOTE — Telephone Encounter (Signed)
Insulin is not covered by insurance. Pt states medication is over $500. Please advise.

## 2021-01-11 NOTE — Telephone Encounter (Signed)
Pt bought OOP

## 2021-01-12 DIAGNOSIS — H02055 Trichiasis without entropian left lower eyelid: Secondary | ICD-10-CM | POA: Diagnosis not present

## 2021-01-17 ENCOUNTER — Other Ambulatory Visit: Payer: Self-pay | Admitting: Family Medicine

## 2021-01-29 ENCOUNTER — Other Ambulatory Visit: Payer: Self-pay | Admitting: Family Medicine

## 2021-02-03 ENCOUNTER — Other Ambulatory Visit: Payer: Self-pay

## 2021-02-03 MED ORDER — VICTOZA 18 MG/3ML ~~LOC~~ SOPN: PEN_INJECTOR | SUBCUTANEOUS | 1 refills | Status: DC

## 2021-02-14 ENCOUNTER — Other Ambulatory Visit: Payer: Self-pay | Admitting: Family Medicine

## 2021-03-31 ENCOUNTER — Other Ambulatory Visit: Payer: Self-pay

## 2021-03-31 ENCOUNTER — Ambulatory Visit (INDEPENDENT_AMBULATORY_CARE_PROVIDER_SITE_OTHER): Payer: Medicare Other

## 2021-03-31 ENCOUNTER — Telehealth: Payer: Self-pay

## 2021-03-31 VITALS — BP 124/82 | HR 66 | Temp 97.1°F | Resp 16 | Ht 67.0 in | Wt 197.8 lb

## 2021-03-31 DIAGNOSIS — Z0000 Encounter for general adult medical examination without abnormal findings: Secondary | ICD-10-CM

## 2021-03-31 MED ORDER — PEN NEEDLES 31G X 5 MM MISC: 1.0000 | Freq: Every day | 5 refills | Status: DC

## 2021-03-31 NOTE — Patient Instructions (Addendum)
Shaun Knight , Thank you for taking time to come for your Medicare Wellness Visit. I appreciate your ongoing commitment to your health goals. Please review the following plan we discussed and let me know if I can assist you in the future.   Screening recommendations/referrals: Colonoscopy: Completed 04/25/2017-Due 04/25/2022 Recommended yearly ophthalmology/optometry visit for glaucoma screening and checkup Recommended yearly dental visit for hygiene and checkup  Vaccinations: Influenza vaccine: Up to date Pneumococcal vaccine: Completed vaccines Tdap vaccine: Up to date-Due 07/27/2022 Shingles vaccine: Discuss with pharmacy Covid-19: Completed vaccines  Advanced directives: Please bring a copy for your chart  Conditions/risks identified: See problem list  Next appointment: Follow up in one year for your annual wellness visit. 04/06/2022 @ 8:15  Preventive Care 65 Years and Older, Male Preventive care refers to lifestyle choices and visits with your health care provider that can promote health and wellness. What does preventive care include?  A yearly physical exam. This is also called an annual well check.  Dental exams once or twice a year.  Routine eye exams. Ask your health care provider how often you should have your eyes checked.  Personal lifestyle choices, including:  Daily care of your teeth and gums.  Regular physical activity.  Eating a healthy diet.  Avoiding tobacco and drug use.  Limiting alcohol use.  Practicing safe sex.  Taking low doses of aspirin every day.  Taking vitamin and mineral supplements as recommended by your health care provider. What happens during an annual well check? The services and screenings done by your health care provider during your annual well check will depend on your age, overall health, lifestyle risk factors, and family history of disease. Counseling  Your health care provider may ask you questions about your:  Alcohol  use.  Tobacco use.  Drug use.  Emotional well-being.  Home and relationship well-being.  Sexual activity.  Eating habits.  History of falls.  Memory and ability to understand (cognition).  Work and work Statistician. Screening  You may have the following tests or measurements:  Height, weight, and BMI.  Blood pressure.  Lipid and cholesterol levels. These may be checked every 5 years, or more frequently if you are over 79 years old.  Skin check.  Lung cancer screening. You may have this screening every year starting at age 74 if you have a 30-pack-year history of smoking and currently smoke or have quit within the past 15 years.  Fecal occult blood test (FOBT) of the stool. You may have this test every year starting at age 22.  Flexible sigmoidoscopy or colonoscopy. You may have a sigmoidoscopy every 5 years or a colonoscopy every 10 years starting at age 34.  Prostate cancer screening. Recommendations will vary depending on your family history and other risks.  Hepatitis C blood test.  Hepatitis B blood test.  Sexually transmitted disease (STD) testing.  Diabetes screening. This is done by checking your blood sugar (glucose) after you have not eaten for a while (fasting). You may have this done every 1-3 years.  Abdominal aortic aneurysm (AAA) screening. You may need this if you are a current or former smoker.  Osteoporosis. You may be screened starting at age 17 if you are at high risk. Talk with your health care provider about your test results, treatment options, and if necessary, the need for more tests. Vaccines  Your health care provider may recommend certain vaccines, such as:  Influenza vaccine. This is recommended every year.  Tetanus, diphtheria, and acellular  pertussis (Tdap, Td) vaccine. You may need a Td booster every 10 years.  Zoster vaccine. You may need this after age 83.  Pneumococcal 13-valent conjugate (PCV13) vaccine. One dose is  recommended after age 41.  Pneumococcal polysaccharide (PPSV23) vaccine. One dose is recommended after age 43. Talk to your health care provider about which screenings and vaccines you need and how often you need them. This information is not intended to replace advice given to you by your health care provider. Make sure you discuss any questions you have with your health care provider. Document Released: 01/01/2016 Document Revised: 08/24/2016 Document Reviewed: 10/06/2015 Elsevier Interactive Patient Education  2017 [place] Prevention in the Home Falls can cause injuries. They can happen to people of all ages. There are many things you can do to make your home safe and to help prevent falls. What can I do on the outside of my home?  Regularly fix the edges of walkways and driveways and fix any cracks.  Remove anything that might make you trip as you walk through a door, such as a raised step or threshold.  Trim any bushes or trees on the path to your home.  Use bright outdoor lighting.  Clear any walking paths of anything that might make someone trip, such as rocks or tools.  Regularly check to see if handrails are loose or broken. Make sure that both sides of any steps have handrails.  Any raised decks and porches should have guardrails on the edges.  Have any leaves, snow, or ice cleared regularly.  Use sand or salt on walking paths during winter.  Clean up any spills in your garage right away. This includes oil or grease spills. What can I do in the bathroom?  Use night lights.  Install grab bars by the toilet and in the tub and shower. Do not use towel bars as grab bars.  Use non-skid mats or decals in the tub or shower.  If you need to sit down in the shower, use a plastic, non-slip stool.  Keep the floor dry. Clean up any water that spills on the floor as soon as it happens.  Remove soap buildup in the tub or shower regularly.  Attach bath mats  securely with double-sided non-slip rug tape.  Do not have throw rugs and other things on the floor that can make you trip. What can I do in the bedroom?  Use night lights.  Make sure that you have a light by your bed that is easy to reach.  Do not use any sheets or blankets that are too big for your bed. They should not hang down onto the floor.  Have a firm chair that has side arms. You can use this for support while you get dressed.  Do not have throw rugs and other things on the floor that can make you trip. What can I do in the kitchen?  Clean up any spills right away.  Avoid walking on wet floors.  Keep items that you use a lot in easy-to-reach places.  If you need to reach something above you, use a strong step stool that has a grab bar.  Keep electrical cords out of the way.  Do not use floor polish or wax that makes floors slippery. If you must use wax, use non-skid floor wax.  Do not have throw rugs and other things on the floor that can make you trip. What can I do with my stairs?  Do not leave any items on the stairs.  Make sure that there are handrails on both sides of the stairs and use them. Fix handrails that are broken or loose. Make sure that handrails are as long as the stairways.  Check any carpeting to make sure that it is firmly attached to the stairs. Fix any carpet that is loose or worn.  Avoid having throw rugs at the top or bottom of the stairs. If you do have throw rugs, attach them to the floor with carpet tape.  Make sure that you have a light switch at the top of the stairs and the bottom of the stairs. If you do not have them, ask someone to add them for you. What else can I do to help prevent falls?  Wear shoes that:  Do not have high heels.  Have rubber bottoms.  Are comfortable and fit you well.  Are closed at the toe. Do not wear sandals.  If you use a stepladder:  Make sure that it is fully opened. Do not climb a closed  stepladder.  Make sure that both sides of the stepladder are locked into place.  Ask someone to hold it for you, if possible.  Clearly mark and make sure that you can see:  Any grab bars or handrails.  First and last steps.  Where the edge of each step is.  Use tools that help you move around (mobility aids) if they are needed. These include:  Canes.  Walkers.  Scooters.  Crutches.  Turn on the lights when you go into a dark area. Replace any light bulbs as soon as they burn out.  Set up your furniture so you have a clear path. Avoid moving your furniture around.  If any of your floors are uneven, fix them.  If there are any pets around you, be aware of where they are.  Review your medicines with your doctor. Some medicines can make you feel dizzy. This can increase your chance of falling. Ask your doctor what other things that you can do to help prevent falls. This information is not intended to replace advice given to you by your health care provider. Make sure you discuss any questions you have with your health care provider. Document Released: 10/01/2009 Document Revised: 05/12/2016 Document Reviewed: 01/09/2015 Elsevier Interactive Patient Education  2017 [employer].

## 2021-03-31 NOTE — Telephone Encounter (Signed)
Dr. Anitra Lauth. Patient is requesting refills of his pen needles . He is also wanting to know if there is anything he can take for his leg cramps. He states he has leg cramps in the evenings when he sits down to rest. He also wants you to know that he has stopped taking the Victoza. He says his blood sugars are much better since he started on the insulin.

## 2021-03-31 NOTE — Progress Notes (Signed)
Subjective:   Shaun Knight is a 70 y.o. male who presents for Medicare Annual/Subsequent preventive examination.  Review of Systems     Cardiac Risk Factors include: advanced age (>59men, >34 women);male gender;diabetes mellitus;dyslipidemia;hypertension;obesity (BMI >30kg/m2)     Objective:    Today's Vitals   03/31/21 0803  BP: 124/82  Pulse: 66  Resp: 16  Temp: (!) 97.1 F (36.2 C)  TempSrc: Temporal  SpO2: 97%  Weight: 197 lb 12.8 oz (89.7 kg)  Height: 5\' 7"  (1.702 m)   Body mass index is 30.98 kg/m.  Advanced Directives 03/31/2021 10/08/2019 08/25/2017  Does Patient Have a Medical Advance Directive? Yes No Yes  Type of Paramedic of [place];Living will - Living will  Copy of [place] in Chart? No - copy requested - -    Current Medications (verified) Outpatient Encounter Medications as of 03/31/2021  Medication Sig  . atorvastatin (LIPITOR) 80 MG tablet Take 1 tablet (80 mg total) by mouth daily.  . Continuous Blood Gluc Receiver (FREESTYLE LIBRE 14 DAY READER) DEVI Use to check blood sugar 1-2 times daily.  . Continuous Blood Gluc Sensor (FREESTYLE LIBRE 14 DAY SENSOR) MISC Use to check blood sugar 1-2 times daily.  . fenofibrate (TRICOR) 145 MG tablet TAKE 1 TABLET(145 MG) BY MOUTH DAILY  . hydrochlorothiazide (HYDRODIURIL) 25 MG tablet TAKE 1 TABLET BY MOUTH DAILY  . insulin detemir (LEVEMIR FLEXTOUCH) 100 UNIT/ML FlexPen 20 U SQ qhs  . Insulin Pen Needle (PEN NEEDLES) 31G X 5 MM MISC 1 each by Does not apply route daily.  . metFORMIN (GLUCOPHAGE) 1000 MG tablet Take 1 tablet (1,000 mg total) by mouth 2 (two) times daily.  . pantoprazole (PROTONIX) 40 MG tablet TAKE 1 TABLET BY MOUTH DAILY  . Insulin Syringe-Needle U-100 25G X 5/8" 1 ML MISC Use to inject insulin twice a day (Patient not taking: No sig reported)  . Lancets (ONETOUCH DELICA PLUS [id number]) MISC USE TO CHECK BLOOD SUGAR TWICE DAILY (Patient not taking: No  sig reported)  . liraglutide (VICTOZA) 18 MG/3ML SOPN INJECT 0.3 MLS(1.8MG  TOTAL) INTO THE SKIN DAILY (Patient not taking: Reported on 03/31/2021)  . ONE TOUCH ULTRA TEST test strip TEST SUGAR DAILY (Patient not taking: No sig reported)   No facility-administered encounter medications on file as of 03/31/2021.    Allergies (verified) Patient has no known allergies.   History: Past Medical History:  Diagnosis Date  . Adenomatous colon polyp 04/2006; 2018   2007 Dr. Lajoyce Corners; 2018 Dr. Stefano Gaul 04/2022.  Marland Kitchen Chronic renal insufficiency, stage II (mild) 2017   CrCl 60s  . Diabetes mellitus   . Diverticulosis of colon    hospitalized for diverticulitis in remote past; also had episode 08/27/14  . Fatty liver 2015   Noted on noncontrast CT done for eval for kidney stones  . History of ETT    LOW RISK/NEG ETT 02/2011 w/Dr. Doreatha Lew  . History of hemorrhoids   . Hyperlipidemia    Crestor caused leg cramps  . Hypertension   . Left foot pain    medial: post tibial tendonitis---podiatrist did sheath injection 03/2018 and 02/2019  . Nephrolithiasis    Saw urologist in [place] past (?[facility]). Left mid ureteral stone 01/2016--ED visit.  . Prostate nodule 08/2018   Referred to urol (PSA was normal/unchanged from prev year).  . Renal cyst, left    hemorrhagic vs proteinacious (last f/u CT 2007 showed stability)--Dr. Lajoyce Corners.  . Shoulder pain, right  Injection on 2 consec mo at [facility] (Dr. Marlou Sa) 2012   Past Surgical History:  Procedure Laterality Date  . CHALAZION EXCISION  summer 2015  . COLONOSCOPY W/ POLYPECTOMY  04/2006; 04/2017   2007: Adenomatous polyps x 2; recall 5 yrs (Dr. Lajoyce Corners).  Repeat 04/2017 (Dr. Philomena Doheny adenoma x1 + sigmoid diverticulosis).  Recall 5 yrs.  . ESOPHAGOGASTRODUODENOSCOPY  2008   Esophagitis/gastritis/GERD.  H pylori and Barrett's NEG.  Marland Kitchen HEMORRHOID SURGERY    . NOSE SURGERY     deviated septum   Family History  Problem Relation Age of Onset  . Diabetes  Mother   . Hypertension Mother   . Coronary artery disease Mother   . Diabetes Father   . Hypertension Father   . Coronary artery disease Father   . Lymphoma Father        non-hodgkin  . Obesity Daughter   . Cancer Paternal Grandmother        unknown   Social History   Socioeconomic History  . Marital status: Married    Spouse name: Not on file  . Number of children: Not on file  . Years of education: Not on file  . Highest education level: Not on file  Occupational History  . Not on file  Tobacco Use  . Smoking status: Never Smoker  . Smokeless tobacco: Never Used  Vaping Use  . Vaping Use: Never used  Substance and Sexual Activity  . Alcohol use: No  . Drug use: No  . Sexual activity: Yes    Partners: Female  Other Topics Concern  . Not on file  Social History Narrative   Married, 1 son and 1 daughter.   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   No T/A/Ds.   Social Determinants of Health   Financial Resource Strain: Low Risk   . Difficulty of Paying Living Expenses: Not hard at all  Food Insecurity: No Food Insecurity  . Worried About Charity fundraiser in the Last Year: Never true  . Ran Out of Food in the Last Year: Never true  Transportation Needs: No Transportation Needs  . Lack of Transportation (Medical): No  . Lack of Transportation (Non-Medical): No  Physical Activity: Inactive  . Days of Exercise per Week: 0 days  . Minutes of Exercise per Session: 0 min  Stress: No Stress Concern Present  . Feeling of Stress : Not at all  Social Connections: Moderately Integrated  . Frequency of Communication with Friends and Family: More than three times a week  . Frequency of Social Gatherings with Friends and Family: More than three times a week  . Attends Religious Services: Never  . Active Member of Clubs or Organizations: Yes  . Attends Archivist Meetings: More than 4 times per year  . Marital  Status: Married    Tobacco Counseling Counseling given: Not Answered   Clinical Intake:  Pre-visit preparation completed: No  Pain : No/denies pain     Nutritional Status: BMI > 30  Obese Nutritional Risks: None Diabetes: Yes CBG done?: No Did pt. bring in CBG monitor from home?: No  How often do you need to have someone help you when you read instructions, pamphlets, or other written materials from your doctor or pharmacy?: 1 - Never  Diabetes:  Is the patient diabetic?  Yes  If diabetic, was a CBG obtained today?  No  Did the patient bring in their glucometer from home?  No  How often do you monitor your CBG's? daily.   Financial Strains and Diabetes Management:  Are you having any financial strains with the device, your supplies or your medication? No .  Does the patient want to be seen by Chronic Care Management for management of their diabetes?  No  Would the patient like to be referred to a Nutritionist or for Diabetic Management?  No   Diabetic Exams:  Diabetic Eye Exam: . Overdue for diabetic eye exam. Pt has been advised about the importance in completing this exam. Patient says he has an appt next month.  Diabetic Foot Exam: Completed 12/31/2020.     Interpreter Needed?: No  Information entered by :: Caroleen Hamman LPN   Activities of Daily Living In your present state of health, do you have any difficulty performing the following activities: 03/31/2021  Hearing? N  Vision? N  Difficulty concentrating or making decisions? N  Walking or climbing stairs? N  Dressing or bathing? N  Doing errands, shopping? N  Preparing Food and eating ? N  Using the Toilet? N  In the past six months, have you accidently leaked urine? N  Do you have problems with loss of bowel control? N  Managing your Medications? N  Managing your Finances? N  Housekeeping or managing your Housekeeping? N  Some recent data might be hidden    Patient Care Team: Tammi Sou, MD as PCP - General (Family Medicine) Milus Banister, MD as Consulting Physician (Gastroenterology) Paulla Dolly Tamala Fothergill, DPM as Consulting Physician (Podiatry) Gregor Hams, MD as Consulting Physician (Sports Medicine)  Indicate any recent Medical Services you may have received from other than [facility] providers in the past year (date may be approximate).     Assessment:   This is a routine wellness examination for [place].  Hearing/Vision screen  Hearing Screening   125Hz  250Hz  500Hz  1000Hz  2000Hz  3000Hz  4000Hz  6000Hz  8000Hz   Right ear:           Left ear:           Comments: No issues  Vision Screening Comments: Last eye exam-several years ago-has an upcoming appt scheduled  Dietary issues and exercise activities discussed: Current Exercise Habits: The patient has a physically strenuous job, but has no regular exercise apart from work., Exercise limited by: None identified  Goals    . Patient Stated     Maintain current active lifestyle      Depression Screen PHQ 2/9 Scores 03/31/2021 09/17/2019 08/28/2018 08/25/2017 10/24/2016  PHQ - 2 Score 0 0 0 0 0    Fall Risk Fall Risk  03/31/2021 06/30/2020 08/28/2018 08/25/2017 10/24/2016  Falls in the past year? 0 0 No No No  Number falls in past yr: 0 0 - - -  Injury with Fall? 0 0 - - -  Follow up Falls prevention discussed Falls evaluation completed - - -    FALL RISK PREVENTION PERTAINING TO THE HOME:  Any stairs in or around the home? No  Home freeof loose throw rugs in walkways, pet beds, electrical cords, etc? Yes  Adequate lighting in your home to reduce risk of falls? Yes   ASSISTIVE DEVICES UTILIZED TO PREVENT FALLS:  Life alert? No  Use of a cane, walker or w/c? No  Grab bars in the bathroom? Yes  Shower chair or bench in shower? No  Elevated toilet seat or a handicapped toilet? No   TIMED UP AND GO:  Was the test performed? Yes .  Length of time to ambulate 10 feet: 9 sec.   Gait steady and fast without use of  assistive device  Cognitive Function:Normal cognitive status assessed by direct observation by this Nurse Health Advisor. No abnormalities found.          Immunizations Immunization History  Administered Date(s) Administered  . Fluad Quad(high Dose 65+) 09/17/2019, 12/31/2020  . Hepatitis A, Adult 08/11/2014  . Influenza Whole 10/19/2010, 10/04/2011  . Influenza, High Dose Seasonal PF 10/24/2016, 08/25/2017, 08/28/2018  . Influenza,inj,Quad PF,6+ Mos 08/11/2014, 09/30/2015  . PFIZER(Purple Top)SARS-COV-2 Vaccination 01/08/2020, 01/29/2020, 11/11/2020  . Pneumococcal Conjugate-13 08/25/2017  . Pneumococcal Polysaccharide-23 07/27/2012, 08/28/2018  . Tdap 07/27/2012  . Zoster 11/13/2011    TDAP status: Up to date  Flu Vaccine status: Up to date  Pneumococcal vaccine status: Up to date  Covid-19 vaccine status: Completed vaccines  Qualifies for Shingles Vaccine? Yes   Zostavax completed Yes   Shingrix Completed?: No.    Education has been provided regarding the importance of this vaccine. Patient has been advised to call insurance company to determine out of pocket expense if they have not yet received this vaccine. Advised may also receive vaccine at local pharmacy or Health Dept. Verbalized acceptance and understanding.  Screening Tests Health Maintenance  Topic Date Due  . OPHTHALMOLOGY EXAM  02/26/2016  . HEMOGLOBIN A1C  06/30/2021  . INFLUENZA VACCINE  07/19/2021  . FOOT EXAM  12/31/2021  . URINE MICROALBUMIN  12/31/2021  . COLONOSCOPY (Pts 45-34yrs Insurance coverage will need to be confirmed)  04/25/2022  . TETANUS/TDAP  07/27/2022  . COVID-19 Vaccine  Completed  . Hepatitis C Screening  Completed  . PNA vac Low Risk Adult  Completed  . HPV VACCINES  Aged Out    Health Maintenance  Health Maintenance Due  Topic Date Due  . OPHTHALMOLOGY EXAM  02/26/2016    Colorectal cancer screening: Type of screening: Colonoscopy. Completed 04/25/2017. Repeat every 5  years  Lung Cancer Screening: (Low Dose CT Chest recommended if Age 49-80 years, 30 pack-year currently smoking OR have quit w/in 15years.) does not qualify.     Additional Screening:  Hepatitis C Screening:Completed 08/25/2017  Vision Screening: Recommended annual ophthalmology exams for early detection of glaucoma and other disorders of the eye. Is the patient up to date with their annual eye exam?  No  Who is the provider or what is the name of the office in which the patient attends annual eye exams? [place] Screening: Recommended annual dental exams for proper oral hygiene  Community Resource Referral / Chronic Care Management: CRR required this visit?  No   CCM required this visit?  No      Plan:     I have personally reviewed and noted the following in the patient's chart:   . Medical and social history . Use of alcohol, tobacco or illicit drugs  . Current medications and supplements . Functional ability and status . Nutritional status . Physical activity . Advanced directives . List of other physicians . Hospitalizations, surgeries, and ER visits in previous 12 months . Vitals . Screenings to include cognitive, depression, and falls . Referrals and appointments  In addition, I have reviewed and discussed with patient certain preventive protocols, quality metrics, and best practice recommendations. A written personalized care plan for preventive services as well as general preventive health recommendations were provided to patient.     Marta Antu, LPN   03/09/253  Nurse Health Advisor  Nurse Notes: None

## 2021-03-31 NOTE — Telephone Encounter (Signed)
Pls do pen needles RF's for him. Advise 500 mg magnesium hydroxide tab every day and 3 oz of tonic water (can get at grocery store) every morning and every evening for muscle cramps.

## 2021-03-31 NOTE — Telephone Encounter (Signed)
Spoke with pt regarding recommendations, rx sent for pen needles. Pharmacy verified

## 2021-05-14 DIAGNOSIS — H0288A Meibomian gland dysfunction right eye, upper and lower eyelids: Secondary | ICD-10-CM | POA: Diagnosis not present

## 2021-05-14 DIAGNOSIS — E119 Type 2 diabetes mellitus without complications: Secondary | ICD-10-CM | POA: Diagnosis not present

## 2021-05-14 DIAGNOSIS — H0288B Meibomian gland dysfunction left eye, upper and lower eyelids: Secondary | ICD-10-CM | POA: Diagnosis not present

## 2021-05-14 DIAGNOSIS — H2513 Age-related nuclear cataract, bilateral: Secondary | ICD-10-CM | POA: Diagnosis not present

## 2021-05-14 DIAGNOSIS — H52223 Regular astigmatism, bilateral: Secondary | ICD-10-CM | POA: Diagnosis not present

## 2021-05-14 DIAGNOSIS — H524 Presbyopia: Secondary | ICD-10-CM | POA: Diagnosis not present

## 2021-05-14 LAB — HM DIABETES EYE EXAM

## 2021-06-13 ENCOUNTER — Other Ambulatory Visit: Payer: Self-pay | Admitting: Family Medicine

## 2021-06-14 ENCOUNTER — Other Ambulatory Visit: Payer: Self-pay | Admitting: Family Medicine

## 2021-06-18 ENCOUNTER — Other Ambulatory Visit: Payer: Self-pay | Admitting: Family Medicine

## 2021-06-26 ENCOUNTER — Other Ambulatory Visit: Payer: Self-pay | Admitting: Family Medicine

## 2021-06-30 NOTE — Progress Notes (Signed)
OFFICE VISIT  07/01/2021  CC:  Chief Complaint  Patient presents with   Follow-up    RCI, pt is fasting    HPI:    Patient is a 70 y.o. Caucasian male who presents for 6 mo f/u DM 2, HLD, HTN. A/P as of last visit: "1) DM, hx of poor control.  No home monitoring. H is compliant with meds at this time.  Feet exam normal today. Has declined my recommendation of insulin treatment multiple times in the past. Will rx Libre again, check glucose qid. Hba1c, urine microalb/cr, lytes/cr, GAD ab's, insulin and c-peptide levels today. Trying to get him to accept that he either needs to take insulin OR I need him to discuss things with an endocrinologist (or both).   2) HTN: good control.  Cont hctz 25mg  qd. Lytes/cr today.   3) HLD: tolerating 80mg  qd. FLP and hepatic panel today.   4) Preventative health care: Vaccines all UTD. Prostate ca screening: PSA today. Colon ca screening: hx of polyps, recall 04/2022."  INTERIM HX: Feeling fine. Some recent R flank pain, mild and waxing and waning.  No rad to groin.  Not hurting at all right now. No blood in urine.  +hx of kidney stones.   DM: Hba1c was 13.2% six mo ago and I recommended insulin again. He started tresiba for last 3-4 mo, 20 U qAM and 20 U qP most days, sometimes backs down to 10 U. No longer taking victoza. Home glucoses 140-190 range since getting on this. (Invokana and jardiance rx'd in the past, ?cost issue?)  HTN: no home bp monitoring  HLD: tolerating atorva 80 qd and fenofibrate.  ROS as above, plus--> no fevers, no CP, no SOB, no wheezing, no cough, no dizziness, no HAs, no rashes, no melena/hematochezia.  No polyuria or polydipsia.  No myalgias or arthralgias.  No focal weakness, paresthesias, or tremors.  No acute vision or hearing abnormalities.  No dysuria or unusual/new urinary urgency or frequency.  No recent changes in lower legs. No n/v/d or abd pain.  No palpitations.    Past Medical History:   Diagnosis Date   Adenomatous colon polyp 04/2006; 2018   2007 Dr. Lajoyce Corners; 2018 Dr. Stefano Gaul 04/2022.   Chronic renal insufficiency, stage II (mild) 2017   CrCl 60s   Diabetes mellitus    Diverticulosis of colon    hospitalized for diverticulitis in remote past; also had episode 08/27/14   Fatty liver 2015   Noted on noncontrast CT done for eval for kidney stones   History of ETT    LOW RISK/NEG ETT 02/2011 w/Dr. Doreatha Lew   History of hemorrhoids    Hyperlipidemia    Crestor caused leg cramps   Hypertension    Left foot pain    medial: post tibial tendonitis---podiatrist did sheath injection 03/2018 and 02/2019   Nephrolithiasis    Saw urologist in [place] past ([facility]). Left mid ureteral stone 01/2016--ED visit.   Prostate nodule 08/2018   Referred to urol (PSA was normal/unchanged from prev year).   Renal cyst, left    hemorrhagic vs proteinacious (last f/u CT 2007 showed stability)--Dr. Lajoyce Corners.   Shoulder pain, right     Injection on 2 consec mo at [place] (Dr. Marlou Sa) 2012    Past Surgical History:  Procedure Laterality Date   CHALAZION EXCISION  summer 2015   COLONOSCOPY W/ POLYPECTOMY  04/2006; 04/2017   2007: Adenomatous polyps x 2; recall 5 yrs (Dr. Lajoyce Corners).  Repeat 04/2017 (Dr. Philomena Doheny  adenoma x1 + sigmoid diverticulosis).  Recall 5 yrs.   ESOPHAGOGASTRODUODENOSCOPY  2008   Esophagitis/gastritis/GERD.  H pylori and Barrett's NEG.   HEMORRHOID SURGERY     NOSE SURGERY     deviated septum    Outpatient Medications Prior to Visit  Medication Sig Dispense Refill   atorvastatin (LIPITOR) 80 MG tablet TAKE 1 TABLET(80 MG) BY MOUTH DAILY 30 tablet 0   Continuous Blood Gluc Receiver (FREESTYLE LIBRE 14 DAY READER) DEVI Use to check blood sugar 1-2 times daily. 1 each 1   Continuous Blood Gluc Sensor (FREESTYLE LIBRE 14 DAY SENSOR) MISC Use to check blood sugar 1-2 times daily. 3 each 5   fenofibrate (TRICOR) 145 MG tablet TAKE 1 TABLET(145 MG) BY MOUTH DAILY 90 tablet  1   hydrochlorothiazide (HYDRODIURIL) 25 MG tablet TAKE 1 TABLET BY MOUTH DAILY 90 tablet 1   Insulin Pen Needle (PEN NEEDLES) 31G X 5 MM MISC 1 each by Does not apply route daily. 100 each 5   metFORMIN (GLUCOPHAGE) 1000 MG tablet TAKE 1 TABLET(1000 MG) BY MOUTH TWICE DAILY 60 tablet 0   pantoprazole (PROTONIX) 40 MG tablet TAKE 1 TABLET BY MOUTH DAILY 30 tablet 0   TRESIBA FLEXTOUCH 100 UNIT/ML FlexTouch Pen SMARTSIG:20 Unit(s) SUB-Q     Insulin Syringe-Needle U-100 25G X 5/8" 1 ML MISC Use to inject insulin twice a day (Patient not taking: No sig reported) 100 each 6   Lancets (ONETOUCH DELICA PLUS [id number]) MISC USE TO CHECK BLOOD SUGAR TWICE DAILY (Patient not taking: No sig reported) 100 each 11   ONE TOUCH ULTRA TEST test strip TEST SUGAR DAILY (Patient not taking: No sig reported) 100 each 11   insulin detemir (LEVEMIR FLEXTOUCH) 100 UNIT/ML FlexPen 20 U SQ qhs (Patient not taking: Reported on 07/01/2021) 15 mL 6   liraglutide (VICTOZA) 18 MG/3ML SOPN INJECT 0.3 MLS(1.8MG  TOTAL) INTO THE SKIN DAILY (Patient not taking: No sig reported) 27 mL 1   No facility-administered medications prior to visit.    No Known Allergies  ROS As per HPI  PE: Vitals with BMI 07/01/2021 03/31/2021 12/31/2020  Height 5' 8.5" 5\' 7"  5\' 7"   Weight 199 lbs 6 oz 197 lbs 13 oz 194 lbs  BMI 29.87 83.15 17.61  Systolic 607 371 062  Diastolic 76 82 80  Pulse 70 66 71   Gen: Alert, well appearing.  Patient is oriented to person, place, time, and situation. AFFECT: pleasant, lucid thought and speech. CV: RRR, no m/r/g.   LUNGS: CTA bilat, nonlabored resps, good aeration in all lung fields. EXT: no clubbing or cyanosis.  no edema.    LABS:  Lab Results  Component Value Date   TSH 1.22 08/28/2018   Lab Results  Component Value Date   WBC 4.8 06/30/2020   HGB 14.4 06/30/2020   HCT 42.0 06/30/2020   MCV 91.8 06/30/2020   PLT 198.0 06/30/2020   Lab Results  Component Value Date   CREATININE 1.32  12/31/2020   BUN 20 12/31/2020   NA 133 (L) 12/31/2020   K 3.9 12/31/2020   CL 94 (L) 12/31/2020   CO2 31 12/31/2020   Lab Results  Component Value Date   ALT 24 12/31/2020   AST 21 12/31/2020   ALKPHOS 36 (L) 12/31/2020   BILITOT 0.8 12/31/2020   Lab Results  Component Value Date   CHOL 225 (H) 12/31/2020   Lab Results  Component Value Date   HDL 36.10 (L) 12/31/2020   Lab  Results  Component Value Date   LDLCALC 99 11/26/2012   Lab Results  Component Value Date   TRIG (H) 12/31/2020    427.0 Triglyceride is over 400; calculations on Lipids are invalid.   Lab Results  Component Value Date   CHOLHDL 6 12/31/2020   Lab Results  Component Value Date   PSA 0.45 12/31/2020   PSA 0.60 09/17/2019   PSA 0.59 08/28/2018   Lab Results  Component Value Date   HGBA1C 13.2 (H) 12/31/2020   IMPRESSION AND PLAN:  1) DM 2, better control per home readings since getting on tresiba 3-4 mo ago->cont 20 U bid and continue metformin 1000 mg bid.  We can add victoza back if needed. Hba1c today.  2) HTN: well controlled on hctz 25mg  qd. Lytes/cr today.  3) HLD, mixed: doing well on atorva 80 qd and fenofibrate 145 qd. FLP and hepatic panel today.  4) Intermittent R flank pain, hx of kidney stones. I don't see any need for imaging or med at this point. In the past with passing stone he has benefited from flomax so he'll call if things progress and we'll get him on this med.  An After Visit Summary was printed and given to the patient.  FOLLOW UP: Return in about 6 months (around 01/01/2022) for routine chronic illness f/u.  Signed:  Crissie Sickles, MD           07/01/2021

## 2021-07-01 ENCOUNTER — Ambulatory Visit (INDEPENDENT_AMBULATORY_CARE_PROVIDER_SITE_OTHER): Payer: Medicare Other | Admitting: Family Medicine

## 2021-07-01 ENCOUNTER — Encounter: Payer: Self-pay | Admitting: Family Medicine

## 2021-07-01 ENCOUNTER — Other Ambulatory Visit: Payer: Self-pay

## 2021-07-01 VITALS — BP 122/76 | HR 70 | Temp 98.2°F | Resp 16 | Ht 68.5 in | Wt 199.4 lb

## 2021-07-01 DIAGNOSIS — E119 Type 2 diabetes mellitus without complications: Secondary | ICD-10-CM | POA: Diagnosis not present

## 2021-07-01 DIAGNOSIS — E7800 Pure hypercholesterolemia, unspecified: Secondary | ICD-10-CM | POA: Diagnosis not present

## 2021-07-01 DIAGNOSIS — R109 Unspecified abdominal pain: Secondary | ICD-10-CM

## 2021-07-01 DIAGNOSIS — Z841 Family history of disorders of kidney and ureter: Secondary | ICD-10-CM

## 2021-07-01 DIAGNOSIS — I10 Essential (primary) hypertension: Secondary | ICD-10-CM | POA: Diagnosis not present

## 2021-07-01 LAB — COMPREHENSIVE METABOLIC PANEL
ALT: 20 U/L (ref 0–53)
AST: 23 U/L (ref 0–37)
Albumin: 4.5 g/dL (ref 3.5–5.2)
Alkaline Phosphatase: 25 U/L — ABNORMAL LOW (ref 39–117)
BUN: 31 mg/dL — ABNORMAL HIGH (ref 6–23)
CO2: 27 mEq/L (ref 19–32)
Calcium: 9.7 mg/dL (ref 8.4–10.5)
Chloride: 99 mEq/L (ref 96–112)
Creatinine, Ser: 1.41 mg/dL (ref 0.40–1.50)
GFR: 50.69 mL/min — ABNORMAL LOW (ref >60.00)
Glucose, Bld: 142 mg/dL — ABNORMAL HIGH (ref 70–99)
Potassium: 4.2 mEq/L (ref 3.5–5.1)
Sodium: 137 mEq/L (ref 135–145)
Total Bilirubin: 0.7 mg/dL (ref 0.2–1.2)
Total Protein: 7 g/dL (ref 6.0–8.3)

## 2021-07-01 LAB — LIPID PANEL
Cholesterol: 220 mg/dL — ABNORMAL HIGH (ref 0–200)
HDL: 42 mg/dL (ref >39.00)
LDL Cholesterol: 146 mg/dL — ABNORMAL HIGH (ref 0–99)
NonHDL: 177.93
Total CHOL/HDL Ratio: 5
Triglycerides: 159 mg/dL — ABNORMAL HIGH (ref 0.0–149.0)
VLDL: 31.8 mg/dL (ref 0.0–40.0)

## 2021-07-01 LAB — HEMOGLOBIN A1C: Hgb A1c MFr Bld: 8.1 % — ABNORMAL HIGH (ref 4.6–6.5)

## 2021-07-01 MED ORDER — TRESIBA FLEXTOUCH 100 UNIT/ML ~~LOC~~ SOPN: PEN_INJECTOR | SUBCUTANEOUS | 6 refills | Status: DC

## 2021-07-02 ENCOUNTER — Other Ambulatory Visit: Payer: Self-pay

## 2021-07-02 ENCOUNTER — Other Ambulatory Visit: Payer: Self-pay | Admitting: Family Medicine

## 2021-07-02 MED ORDER — OZEMPIC (0.25 OR 0.5 MG/DOSE) 2 MG/1.5ML ~~LOC~~ SOPN: PEN_INJECTOR | SUBCUTANEOUS | 6 refills | Status: DC

## 2021-07-15 ENCOUNTER — Other Ambulatory Visit: Payer: Self-pay | Admitting: Family Medicine

## 2021-07-18 ENCOUNTER — Other Ambulatory Visit: Payer: Self-pay | Admitting: Family Medicine

## 2021-07-24 ENCOUNTER — Other Ambulatory Visit: Payer: Self-pay | Admitting: Family Medicine

## 2021-08-24 ENCOUNTER — Emergency Department (HOSPITAL_BASED_OUTPATIENT_CLINIC_OR_DEPARTMENT_OTHER)
Admission: EM | Admit: 2021-08-24 | Discharge: 2021-08-25 | Disposition: A | Discharge status: Home / Self Care | Payer: Medicare Other | Attending: Emergency Medicine | Admitting: Emergency Medicine

## 2021-08-24 ENCOUNTER — Other Ambulatory Visit: Payer: Self-pay

## 2021-08-24 ENCOUNTER — Encounter (HOSPITAL_BASED_OUTPATIENT_CLINIC_OR_DEPARTMENT_OTHER): Payer: Self-pay | Admitting: Urology

## 2021-08-24 DIAGNOSIS — R197 Diarrhea, unspecified: Secondary | ICD-10-CM | POA: Insufficient documentation

## 2021-08-24 DIAGNOSIS — E1122 Type 2 diabetes mellitus with diabetic chronic kidney disease: Secondary | ICD-10-CM | POA: Diagnosis not present

## 2021-08-24 DIAGNOSIS — I129 Hypertensive chronic kidney disease with stage 1 through stage 4 chronic kidney disease, or unspecified chronic kidney disease: Secondary | ICD-10-CM | POA: Diagnosis not present

## 2021-08-24 DIAGNOSIS — Z7984 Long term (current) use of oral hypoglycemic drugs: Secondary | ICD-10-CM | POA: Diagnosis not present

## 2021-08-24 DIAGNOSIS — Z79899 Other long term (current) drug therapy: Secondary | ICD-10-CM | POA: Insufficient documentation

## 2021-08-24 DIAGNOSIS — N182 Chronic kidney disease, stage 2 (mild): Secondary | ICD-10-CM | POA: Insufficient documentation

## 2021-08-24 DIAGNOSIS — R112 Nausea with vomiting, unspecified: Secondary | ICD-10-CM

## 2021-08-24 DIAGNOSIS — Z794 Long term (current) use of insulin: Secondary | ICD-10-CM | POA: Insufficient documentation

## 2021-08-24 DIAGNOSIS — I10 Essential (primary) hypertension: Secondary | ICD-10-CM | POA: Diagnosis not present

## 2021-08-24 LAB — COMPREHENSIVE METABOLIC PANEL
ALT: 26 U/L (ref 0–44)
AST: 30 U/L (ref 15–41)
Albumin: 4.6 g/dL (ref 3.5–5.0)
Alkaline Phosphatase: 22 U/L — ABNORMAL LOW (ref 38–126)
Anion gap: 10 (ref 5–15)
BUN: 26 mg/dL — ABNORMAL HIGH (ref 8–23)
CO2: 27 mmol/L (ref 22–32)
Calcium: 9.9 mg/dL (ref 8.9–10.3)
Chloride: 95 mmol/L — ABNORMAL LOW (ref 98–111)
Creatinine, Ser: 1.42 mg/dL — ABNORMAL HIGH (ref 0.61–1.24)
GFR, Estimated: 53 mL/min — ABNORMAL LOW (ref >60)
Glucose, Bld: 176 mg/dL — ABNORMAL HIGH (ref 70–99)
Potassium: 3.9 mmol/L (ref 3.5–5.1)
Sodium: 132 mmol/L — ABNORMAL LOW (ref 135–145)
Total Bilirubin: 0.8 mg/dL (ref 0.3–1.2)
Total Protein: 7.8 g/dL (ref 6.5–8.1)

## 2021-08-24 LAB — CBC
HCT: 48.7 % (ref 39.0–52.0)
Hemoglobin: 17 g/dL (ref 13.0–17.0)
MCH: 31 pg (ref 26.0–34.0)
MCHC: 34.9 g/dL (ref 30.0–36.0)
MCV: 88.9 fL (ref 80.0–100.0)
Platelets: 248 10*3/uL (ref 150–400)
RBC: 5.48 MIL/uL (ref 4.22–5.81)
RDW: 12.6 % (ref 11.5–15.5)
WBC: 10.7 10*3/uL — ABNORMAL HIGH (ref 4.0–10.5)
nRBC: 0 % (ref 0.0–0.2)

## 2021-08-24 LAB — LIPASE, BLOOD: Lipase: 33 U/L (ref 11–51)

## 2021-08-24 MED ORDER — ONDANSETRON HCL 4 MG/2ML IJ SOLN
4.0000 mg | Freq: Once | INTRAMUSCULAR | Status: AC
  Administered 2021-08-24: 4 mg via INTRAVENOUS
  Filled 2021-08-24: qty 2

## 2021-08-24 MED ORDER — LACTATED RINGERS IV BOLUS: 1000.0000 mL | Freq: Once | INTRAVENOUS | Status: DC

## 2021-08-24 NOTE — ED Notes (Signed)
Pt reports he was unable to urinate and was not able to provide urine sample

## 2021-08-24 NOTE — ED Notes (Signed)
Pt having episode of near syncope in lobby with diaphoresis.  BS 155 on monitor.

## 2021-08-24 NOTE — ED Triage Notes (Addendum)
N/V/D that started this morning, denies any fever, Blood sugar 137 at this time.  States belching all day. Increased Ozempic dose on Sunday.

## 2021-08-24 NOTE — ED Notes (Signed)
Dr. Schlossman at bedside. 

## 2021-08-24 NOTE — ED Provider Notes (Signed)
[facility] Provider Note   CSN: [id number] Arrival date & time: 08/24/21  1837     History Chief Complaint  Patient presents with   Nausea   Emesis    Shaun Knight is a 70 y.o. male.  HPI     Nausea starting today Diarrhea, every 3 hours Vomiting 4 times since 630PM, was more than 6 times No abdominal pain felt like something rotten No chest pain, dyspnea, no fevers Lightheaded, blood sugar to 50s at lowest, otherwise has been well controlled No urinary symptoms No sick contacts Ozempic increased on Sunday, did not have symptoms when initially started medicine No fevers, no recent antibiotics, no suspicioius foods  Feels like he is still tasting steak when he belches but feels improved after zofran in ED  Past Medical History:  Diagnosis Date   Adenomatous colon polyp 04/2006; 2018   2007 Dr. Lajoyce Corners; 2018 Dr. Stefano Gaul 04/2022.   Chronic renal insufficiency, stage II (mild) 2017   CrCl 60s   Diabetes mellitus    Diverticulosis of colon    hospitalized for diverticulitis in remote past; also had episode 08/27/14   Fatty liver 2015   Noted on noncontrast CT done for eval for kidney stones   History of ETT    LOW RISK/NEG ETT 02/2011 w/Dr. Doreatha Lew   History of hemorrhoids    Hyperlipidemia    Crestor caused leg cramps   Hypertension    Left foot pain    medial: post tibial tendonitis---podiatrist did sheath injection 03/2018 and 02/2019   Nephrolithiasis    Saw urologist in [place] past (?[facility]). Left mid ureteral stone 01/2016--ED visit.   Prostate nodule 08/2018   Referred to urol (PSA was normal/unchanged from prev year).   Renal cyst, left    hemorrhagic vs proteinacious (last f/u CT 2007 showed stability)--Dr. Lajoyce Corners.   Shoulder pain, right     Injection on 2 consec mo at [place] (Dr. Marlou Sa) 2012    Patient Active Problem List   Diagnosis Date Noted   Diabetes mellitus without complication ([place]) [id number]   Essential  hypertension 12/02/2014   Hyperlipidemia 08/11/2014   Travel advice encounter 08/11/2014   Gastroenteritis 05/20/2013   Pain of right heel 10/04/2012   Type II or unspecified type diabetes mellitus without mention of complication, uncontrolled 07/27/2012   Adenomatous colon polyp    GERD (gastroesophageal reflux disease) 06/25/2012   Erectile dysfunction 04/27/2012   History of kidney stones 03/29/2012   Insomnia 12/04/2011   PLANTAR WART 02/07/2011   DM 02/04/2011   Hyperlipemia, mixed 02/04/2011    Past Surgical History:  Procedure Laterality Date   CHALAZION EXCISION  summer 2015   COLONOSCOPY W/ POLYPECTOMY  04/2006; 04/2017   2007: Adenomatous polyps x 2; recall 5 yrs (Dr. Lajoyce Corners).  Repeat 04/2017 (Dr. Philomena Doheny adenoma x1 + sigmoid diverticulosis).  Recall 5 yrs.   ESOPHAGOGASTRODUODENOSCOPY  2008   Esophagitis/gastritis/GERD.  H pylori and Barrett's NEG.   HEMORRHOID SURGERY     NOSE SURGERY     deviated septum       Family History  Problem Relation Age of Onset   Diabetes Mother    Hypertension Mother    Coronary artery disease Mother    Diabetes Father    Hypertension Father    Coronary artery disease Father    Lymphoma Father        non-hodgkin   Obesity Daughter    Cancer Paternal Grandmother  unknown    Social History   Tobacco Use   Smoking status: Never   Smokeless tobacco: Never  Vaping Use   Vaping Use: Never used  Substance Use Topics   Alcohol use: No   Drug use: No    Home Medications Prior to Admission medications   Medication Sig Start Date End Date Taking? Authorizing Provider  ondansetron (ZOFRAN ODT) 4 MG disintegrating tablet Take 1 tablet (4 mg total) by mouth every 8 (eight) hours as needed for nausea or vomiting. 08/25/21  Yes Gareth Morgan, MD  atorvastatin (LIPITOR) 80 MG tablet TAKE 1 TABLET(80 MG) BY MOUTH DAILY 07/19/21   McGowen, Adrian Blackwater, MD  Continuous Blood Gluc Receiver (FREESTYLE LIBRE 14 DAY READER) DEVI Use to  check blood sugar 1-2 times daily. 01/01/21   McGowen, Adrian Blackwater, MD  Continuous Blood Gluc Sensor (FREESTYLE LIBRE 14 DAY SENSOR) MISC Use to check blood sugar 1-2 times daily. 01/01/21   McGowen, Adrian Blackwater, MD  fenofibrate (TRICOR) 145 MG tablet TAKE 1 TABLET(145 MG) BY MOUTH DAILY 07/19/21   McGowen, Adrian Blackwater, MD  hydrochlorothiazide (HYDRODIURIL) 25 MG tablet TAKE 1 TABLET BY MOUTH DAILY 07/26/21   McGowen, Adrian Blackwater, MD  Insulin Pen Needle (PEN NEEDLES) 31G X 5 MM MISC 1 each by Does not apply route daily. 03/31/21   McGowen, Adrian Blackwater, MD  Insulin Syringe-Needle U-100 25G X 5/8" 1 ML MISC Use to inject insulin twice a day Patient not taking: No sig reported 08/29/17   McGowen, Adrian Blackwater, MD  Lancets (ONETOUCH DELICA PLUS [id number]) [place] USE TO [place] Patient not taking: No sig reported 05/07/19   McGowen, Adrian Blackwater, MD  metFORMIN (GLUCOPHAGE) 1000 MG tablet TAKE 1 TABLET(1000 MG) BY MOUTH TWICE DAILY 07/16/21   McGowen, Adrian Blackwater, MD  ONE TOUCH ULTRA TEST test strip TEST SUGAR DAILY Patient not taking: No sig reported 08/02/17   Tammi Sou, MD  pantoprazole (PROTONIX) 40 MG tablet TAKE 1 TABLET BY MOUTH DAILY 07/19/21   McGowen, Adrian Blackwater, MD  Semaglutide,0.25 or 0.'5MG'$ /DOS, (OZEMPIC, 0.25 OR 0.5 MG/DOSE,) 2 MG/1.5ML SOPN 0.'25mg'$  SQ q week 07/02/21   Tammi Sou, MD  TRESIBA FLEXTOUCH 100 UNIT/ML FlexTouch Pen 20 units SQ bid 07/01/21   McGowen, Adrian Blackwater, MD    Allergies    Patient has no known allergies.  Review of Systems   Review of Systems  Constitutional:  Positive for fatigue. Negative for fever.  HENT:  Negative for sore throat.   Eyes:  Negative for visual disturbance.  Respiratory:  Negative for shortness of breath.   Cardiovascular:  Negative for chest pain.  Gastrointestinal:  Positive for diarrhea, nausea and vomiting. Negative for abdominal pain.  Genitourinary:  Negative for difficulty urinating.  Musculoskeletal:  Negative for back pain and neck stiffness.   Skin:  Negative for rash.  Neurological:  Negative for syncope and headaches.   Physical Exam Updated Vital Signs BP 127/78 (BP Location: Right Arm)   Pulse 88   Temp 98.2 F (36.8 C) (Oral)   Resp 18   Ht 5' 8.5" (1.74 m)   Wt 88.5 kg   SpO2 94%   BMI 29.22 kg/m   Physical Exam Vitals and nursing note reviewed.  Constitutional:      General: He is not in acute distress.    Appearance: He is well-developed. He is not diaphoretic.  HENT:     Head: Normocephalic and atraumatic.  Eyes:  Conjunctiva/sclera: Conjunctivae normal.  Cardiovascular:     Rate and Rhythm: Normal rate and regular rhythm.     Heart sounds: Normal heart sounds. No murmur heard.   No friction rub. No gallop.  Pulmonary:     Effort: Pulmonary effort is normal. No respiratory distress.     Breath sounds: Normal breath sounds. No wheezing or rales.  Abdominal:     General: There is no distension.     Palpations: Abdomen is soft.     Tenderness: There is no abdominal tenderness. There is no guarding.  Musculoskeletal:     Cervical back: Normal range of motion.  Skin:    General: Skin is warm and dry.  Neurological:     Mental Status: He is alert and oriented to person, place, and time.    ED Results / Procedures / Treatments   Labs (all labs ordered are listed, but only abnormal results are displayed) Labs Reviewed  COMPREHENSIVE METABOLIC PANEL - Abnormal; Notable for the following components:      Result Value   Sodium 132 (*)    Chloride 95 (*)    Glucose, Bld 176 (*)    BUN 26 (*)    Creatinine, Ser 1.42 (*)    Alkaline Phosphatase 22 (*)    GFR, Estimated 53 (*)    All other components within normal limits  CBC - Abnormal; Notable for the following components:   WBC 10.7 (*)    All other components within normal limits  LIPASE, BLOOD    EKG EKG Interpretation  Date/Time:  Tuesday August 24 2021 21:12:22 EDT Ventricular Rate:  97 PR Interval:  149 QRS Duration: 92 QT  Interval:  356 QTC Calculation: 453 R Axis:   -37 Text Interpretation: Sinus rhythm Inferior infarct, old No old tracing to compare Confirmed by Dorie Rank [phone]) on 08/25/2021 3:45:42 PM  Radiology No results found.  Procedures Procedures   Medications Ordered in ED Medications  ondansetron (ZOFRAN) injection 4 mg (4 mg Intravenous Given 08/24/21 2217)    ED Course  I have reviewed the triage vital signs and the nursing notes.  Pertinent labs & imaging results that were available during my care of the patient were reviewed by me and considered in my medical decision making (see chart for details).    MDM Rules/Calculators/A&P                           70yo male with history above presents with concern for nauesa, vomiting, diarrhea beginning today.  No abdominal pain, no abdominal tenderness, doubt SBO, diverticulitis, cholecystitis, appendicitis, AAA rupture by history and exam.    Suspect viral vs bacterial gastroenteritis or side effects of recent ozempic increase. No signs of DKA, Cr stable 1.42 from 1.41. Discussed possibility of IV hydration, but is now tolerating po after zofran and has normal vital signs and feel oral hydration is appropriate and it is his preference.  Recommend zofran, supportive care, discussing with PCP if symptoms continue. Patient discharged in stable condition with understanding of reasons to return.   Final Clinical Impression(s) / ED Diagnoses Final diagnoses:  Nausea vomiting and diarrhea    Rx / DC Orders ED Discharge Orders          Ordered    ondansetron (ZOFRAN ODT) 4 MG disintegrating tablet  Every 8 hours PRN        08/25/21 0000  Gareth Morgan, MD 08/25/21 [phone]

## 2021-08-24 NOTE — ED Notes (Signed)
Pt states he does not want to stay for IV fluids, says he would rather do oral intake of fluids. He states he feels much better and is ready to go home. Dr. Abdulaziz Fischer informed.

## 2021-08-24 NOTE — ED Notes (Signed)
Pt ambulatory to restroom

## 2021-08-25 MED ORDER — ONDANSETRON 4 MG PO TBDP: 4.0000 mg | ORAL_TABLET | Freq: Three times a day (TID) | ORAL | 0 refills | Status: DC | PRN

## 2021-08-25 NOTE — ED Notes (Signed)
Pt A&OX4, ambulatory at d/c with independent steady gait, NAD

## 2021-08-28 ENCOUNTER — Other Ambulatory Visit: Payer: Self-pay | Admitting: Family Medicine

## 2021-08-28 DIAGNOSIS — E119 Type 2 diabetes mellitus without complications: Secondary | ICD-10-CM

## 2021-11-08 ENCOUNTER — Telehealth: Payer: Self-pay | Admitting: Family Medicine

## 2021-11-08 MED ORDER — FREESTYLE LIBRE 3 SENSOR MISC: 1.0000 | 5 refills | Status: DC

## 2021-11-08 NOTE — Telephone Encounter (Signed)
Pt was advised updated sensor sent in.

## 2021-11-18 ENCOUNTER — Other Ambulatory Visit: Payer: Self-pay

## 2021-11-18 MED ORDER — FREESTYLE LIBRE 3 SENSOR MISC: 1.0000 | 5 refills | Status: DC

## 2021-12-04 DIAGNOSIS — Z23 Encounter for immunization: Secondary | ICD-10-CM | POA: Diagnosis not present

## 2021-12-30 ENCOUNTER — Other Ambulatory Visit: Payer: Self-pay

## 2021-12-30 ENCOUNTER — Ambulatory Visit (INDEPENDENT_AMBULATORY_CARE_PROVIDER_SITE_OTHER): Payer: Medicare Other | Admitting: Family Medicine

## 2021-12-30 ENCOUNTER — Encounter: Payer: Self-pay | Admitting: Family Medicine

## 2021-12-30 VITALS — BP 135/86 | HR 71 | Temp 97.9°F | Ht 68.5 in | Wt 199.6 lb

## 2021-12-30 DIAGNOSIS — Z125 Encounter for screening for malignant neoplasm of prostate: Secondary | ICD-10-CM

## 2021-12-30 DIAGNOSIS — I10 Essential (primary) hypertension: Secondary | ICD-10-CM | POA: Diagnosis not present

## 2021-12-30 DIAGNOSIS — Z23 Encounter for immunization: Secondary | ICD-10-CM | POA: Diagnosis not present

## 2021-12-30 DIAGNOSIS — Z794 Long term (current) use of insulin: Secondary | ICD-10-CM | POA: Diagnosis not present

## 2021-12-30 DIAGNOSIS — E119 Type 2 diabetes mellitus without complications: Secondary | ICD-10-CM

## 2021-12-30 DIAGNOSIS — E7800 Pure hypercholesterolemia, unspecified: Secondary | ICD-10-CM | POA: Diagnosis not present

## 2021-12-30 LAB — COMPREHENSIVE METABOLIC PANEL
ALT: 28 U/L (ref 0–53)
AST: 31 U/L (ref 0–37)
Albumin: 4.5 g/dL (ref 3.5–5.2)
Alkaline Phosphatase: 23 U/L — ABNORMAL LOW (ref 39–117)
BUN: 21 mg/dL (ref 6–23)
CO2: 33 mEq/L — ABNORMAL HIGH (ref 19–32)
Calcium: 9.7 mg/dL (ref 8.4–10.5)
Chloride: 97 mEq/L (ref 96–112)
Creatinine, Ser: 1.29 mg/dL (ref 0.40–1.50)
GFR: 56.2 mL/min — ABNORMAL LOW (ref >60.00)
Glucose, Bld: 115 mg/dL — ABNORMAL HIGH (ref 70–99)
Potassium: 4 mEq/L (ref 3.5–5.1)
Sodium: 138 mEq/L (ref 135–145)
Total Bilirubin: 0.8 mg/dL (ref 0.2–1.2)
Total Protein: 7.1 g/dL (ref 6.0–8.3)

## 2021-12-30 LAB — MICROALBUMIN / CREATININE URINE RATIO
Creatinine,U: 79.3 mg/dL
Microalb Creat Ratio: 0.9 mg/g (ref 0.0–30.0)
Microalb, Ur: <0.7 mg/dL (ref 0.0–1.9)

## 2021-12-30 LAB — HEMOGLOBIN A1C: Hgb A1c MFr Bld: 7 % — ABNORMAL HIGH (ref 4.6–6.5)

## 2021-12-30 LAB — PSA, MEDICARE: PSA: 0.66 ng/ml (ref 0.10–4.00)

## 2021-12-30 MED ORDER — PEN NEEDLES 31G X 5 MM MISC: 1.0000 | Freq: Every day | 5 refills | Status: DC

## 2021-12-30 MED ORDER — ZOSTER VAC RECOMB ADJUVANTED 50 MCG/0.5ML IM SUSR: 0.5000 mL | Freq: Once | INTRAMUSCULAR | 1 refills | Status: AC

## 2021-12-30 MED ORDER — TRESIBA FLEXTOUCH 100 UNIT/ML ~~LOC~~ SOPN: PEN_INJECTOR | SUBCUTANEOUS | 6 refills | Status: DC

## 2021-12-30 NOTE — Progress Notes (Signed)
OFFICE VISIT  12/30/2021  CC:  Chief Complaint  Patient presents with   Follow-up    RCI; not fasting    HPI:    Patient is a 71 y.o. male who presents for 6 mo f/u DM 2, HLD, HTN. A/P as of last visit: "1) DM 2, better control per home readings since getting on tresiba 3-4 mo ago->cont 20 U bid and continue metformin 1000 mg bid.  We can add victoza back if needed. Hba1c today.   2) HTN: well controlled on hctz 25mg  qd. Lytes/cr today.   3) HLD, mixed: doing well on atorva 80 qd and fenofibrate 145 qd. FLP and hepatic panel today.   4) Intermittent R flank pain, hx of kidney stones. I don't see any need for imaging or med at this point. In the past with passing stone he has benefited from flomax so he'll call if things progress and we'll get him on this med."  INTERIM HX: Mansoor feels well. He is taking 20 units of Tresiba divided in unit twice daily dosing. He has a libre 3 meter and glucoses ranging 120s to 200s, but he is pleased that mostly they are in the 130-150 range.  No home blood pressure monitoring. Feet: Denies burning tingling or numbness.  ROS as above, plus--> no fevers, no CP, no SOB, no wheezing, no cough, no dizziness, no HAs, no rashes, no melena/hematochezia.  No polyuria or polydipsia.  No myalgias or arthralgias.  No focal weakness, paresthesias, or tremors.  No acute vision or hearing abnormalities.  No dysuria or unusual/new urinary urgency or frequency.  No recent changes in lower legs. No n/v/d or abd pain.  No palpitations.    Past Medical History:  Diagnosis Date   Adenomatous colon polyp 04/2006; 2018   2007 Dr. Lajoyce Corners; 2018 Dr. Stefano Gaul 04/2022.   Chronic renal insufficiency, stage II (mild) 2017   CrCl 60s   Diabetes mellitus    Diverticulosis of colon    hospitalized for diverticulitis in remote past; also had episode 08/27/14   Fatty liver 2015   Noted on noncontrast CT done for eval for kidney stones   History of ETT    LOW RISK/NEG  ETT 02/2011 w/Dr. Doreatha Lew   History of hemorrhoids    Hyperlipidemia    Crestor caused leg cramps   Hypertension    Left foot pain    medial: post tibial tendonitis---podiatrist did sheath injection 03/2018 and 02/2019   Nephrolithiasis    Saw urologist in [place] past (?[facility]). Left mid ureteral stone 01/2016--ED visit.   Prostate nodule 08/2018   Referred to urol (PSA was normal/unchanged from prev year).   Renal cyst, left    hemorrhagic vs proteinacious (last f/u CT 2007 showed stability)--Dr. Lajoyce Corners.   Shoulder pain, right     Injection on 2 consec mo at [place] (Dr. Marlou Sa) 2012    Past Surgical History:  Procedure Laterality Date   CHALAZION EXCISION  summer 2015   COLONOSCOPY W/ POLYPECTOMY  04/2006; 04/2017   2007: Adenomatous polyps x 2; recall 5 yrs (Dr. Lajoyce Corners).  Repeat 04/2017 (Dr. Philomena Doheny adenoma x1 + sigmoid diverticulosis).  Recall 5 yrs.   ESOPHAGOGASTRODUODENOSCOPY  2008   Esophagitis/gastritis/GERD.  H pylori and Barrett's NEG.   HEMORRHOID SURGERY     NOSE SURGERY     deviated septum    Outpatient Medications Prior to Visit  Medication Sig Dispense Refill   atorvastatin (LIPITOR) 80 MG tablet TAKE 1 TABLET(80 MG) BY MOUTH DAILY  90 tablet 1   Continuous Blood Gluc Sensor (FREESTYLE LIBRE 3 SENSOR) MISC 1 each by Does not apply route as directed. Place 1 sensor on the skin every 14 days. Use to check glucose continuously 3 each 5   fenofibrate (TRICOR) 145 MG tablet TAKE 1 TABLET(145 MG) BY MOUTH DAILY 90 tablet 1   hydrochlorothiazide (HYDRODIURIL) 25 MG tablet TAKE 1 TABLET BY MOUTH DAILY 90 tablet 1   metFORMIN (GLUCOPHAGE) 1000 MG tablet TAKE 1 TABLET(1000 MG) BY MOUTH TWICE DAILY 180 tablet 1   pantoprazole (PROTONIX) 40 MG tablet TAKE 1 TABLET BY MOUTH DAILY 90 tablet 1   Insulin Pen Needle (PEN NEEDLES) 31G X 5 MM MISC 1 each by Does not apply route daily. 100 each 5   TRESIBA FLEXTOUCH 100 UNIT/ML FlexTouch Pen 20 units SQ bid (Patient taking  differently: 10 units SQ bid) 15 mL 6   Continuous Blood Gluc Receiver (FREESTYLE LIBRE 14 DAY READER) DEVI Use to check blood sugar 1-2 times daily. (Patient not taking: Reported on 12/30/2021) 1 each 1   Continuous Blood Gluc Sensor (FREESTYLE LIBRE 14 DAY SENSOR) MISC USE TO CHECK BLOOD SUGAR 1-2 TIMES DAILY (Patient not taking: Reported on 12/30/2021) 3 each 5   Insulin Syringe-Needle U-100 25G X 5/8" 1 ML MISC Use to inject insulin twice a day (Patient not taking: Reported on 12/31/2020) 100 each 6   Lancets (ONETOUCH DELICA PLUS [id number]) MISC USE TO CHECK BLOOD SUGAR TWICE DAILY (Patient not taking: No sig reported) 100 each 11   ondansetron (ZOFRAN ODT) 4 MG disintegrating tablet Take 1 tablet (4 mg total) by mouth every 8 (eight) hours as needed for nausea or vomiting. (Patient not taking: Reported on 12/30/2021) 20 tablet 0   ONE TOUCH ULTRA TEST test strip TEST SUGAR DAILY (Patient not taking: No sig reported) 100 each 11   Semaglutide,0.25 or 0.5MG /DOS, (OZEMPIC, 0.25 OR 0.5 MG/DOSE,) 2 MG/1.5ML SOPN 0.25mg  SQ q week (Patient not taking: Reported on 12/30/2021) 3 mL 6   No facility-administered medications prior to visit.    No Known Allergies  ROS As per HPI  PE: Vitals with BMI 12/30/2021 08/24/2021 08/24/2021  Height 5' 8.5" - -  Weight 199 lbs 10 oz - -  BMI 10.6 - -  Systolic 269 485 462  Diastolic 86 78 78  Pulse 71 88 87     Physical Exam  Gen: Alert, well appearing.  Patient is oriented to person, place, time, and situation. AFFECT: pleasant, lucid thought and speech. CV: RRR, no m/r/g.   LUNGS: CTA bilat, nonlabored resps, good aeration in all lung fields. EXT: no clubbing or cyanosis.  no edema.  Foot exam -  no swelling, tenderness or skin or vascular lesions. Color and temperature is normal. Sensation is intact. Peripheral pulses are palpable. Toenails are normal.   LABS:  Last CBC Lab Results  Component Value Date   WBC 10.7 (H) 08/24/2021   HGB 17.0  08/24/2021   HCT 48.7 08/24/2021   MCV 88.9 08/24/2021   MCH 31.0 08/24/2021   RDW 12.6 08/24/2021   PLT 248 70/35/0093   Last metabolic panel Lab Results  Component Value Date   GLUCOSE 176 (H) 08/24/2021   NA 132 (L) 08/24/2021   K 3.9 08/24/2021   CL 95 (L) 08/24/2021   CO2 27 08/24/2021   BUN 26 (H) 08/24/2021   CREATININE 1.42 (H) 08/24/2021   GFRNONAA 53 (L) 08/24/2021   CALCIUM 9.9 08/24/2021   PROT 7.8  08/24/2021   ALBUMIN 4.6 08/24/2021   BILITOT 0.8 08/24/2021   ALKPHOS 22 (L) 08/24/2021   AST 30 08/24/2021   ALT 26 08/24/2021   ANIONGAP 10 08/24/2021   Last lipids Lab Results  Component Value Date   CHOL 220 (H) 07/01/2021   HDL 42.00 07/01/2021   LDLCALC 146 (H) 07/01/2021   LDLDIRECT 127.0 12/31/2020   TRIG 159.0 (H) 07/01/2021   CHOLHDL 5 07/01/2021   Last hemoglobin A1c Lab Results  Component Value Date   HGBA1C 8.1 (H) 07/01/2021   Last thyroid functions Lab Results  Component Value Date   TSH 1.22 08/28/2018   Lab Results  Component Value Date   PSA 0.45 12/31/2020   PSA 0.60 09/17/2019   PSA 0.59 08/28/2018   IMPRESSION AND PLAN:  #1 diabetes without complication. He is compliant with Tresiba 10 units twice daily and metformin 1000 mg twice a day. Feet exam normal today. Hemoglobin A1c and basic metabolic panel today.  2.  Hypertension.  Well-controlled on HCTZ 25 mg a day. Electrolytes and creatinine today.  3.  Hyperlipidemia, mixed. 80 mg atorvastatin daily and fenofibrate 145 daily. Not fasting today so we will defer lipid check until next visit in 6 months. Hepatic panel today.  4. Preventative health care: Vaccines--Shingrix-rx to pharmacy. Prostate cancer screening--PSA today. Colon cancer screening--due for repeat colonoscopy around May this year.  An After Visit Summary was printed and given to the patient.  FOLLOW UP: Return in about 6 months (around 06/29/2022) for routine chronic illness f/u.  Signed:  Crissie Sickles, MD           12/30/2021

## 2022-01-09 ENCOUNTER — Other Ambulatory Visit: Payer: Self-pay | Admitting: Family Medicine

## 2022-01-12 ENCOUNTER — Other Ambulatory Visit: Payer: Self-pay

## 2022-01-12 MED ORDER — FENOFIBRATE 145 MG PO TABS: ORAL_TABLET | ORAL | 1 refills | Status: DC

## 2022-04-06 ENCOUNTER — Ambulatory Visit: Payer: Medicare Other

## 2022-04-13 ENCOUNTER — Ambulatory Visit (INDEPENDENT_AMBULATORY_CARE_PROVIDER_SITE_OTHER): Payer: Medicare Other

## 2022-04-13 VITALS — BP 120/80 | HR 70 | Temp 98.0°F | Wt 204.1 lb

## 2022-04-13 DIAGNOSIS — Z0000 Encounter for general adult medical examination without abnormal findings: Secondary | ICD-10-CM | POA: Diagnosis not present

## 2022-04-13 NOTE — Patient Instructions (Signed)
Shaun Knight , ?Thank you for taking time to come for your Medicare Wellness Visit. I appreciate your ongoing commitment to your health goals. Please review the following plan we discussed and let me know if I can assist you in the future.  ? ?Screening recommendations/referrals: ?Colonoscopy: Done 04/25/17 repeat every 5 years  ?Recommended yearly ophthalmology/optometry visit for glaucoma screening and checkup ?Recommended yearly dental visit for hygiene and checkup ? ?Vaccinations: ?Influenza vaccine: Done 12/08/21 repeat every year  ?Pneumococcal vaccine: Up to date ?Tdap vaccine: Done 07/27/12 repeat every 10 years Due 07/27/22 ?Shingles vaccine: Shingrix discussed. Please contact your pharmacy for coverage information.    ?Covid-19: Completed 1/20, 2/10, & 11/11/20 ? ?Advanced directives: Please bring a copy of your health care power of attorney and living will to the office at your convenience. ? ? ?Conditions/risks identified: None at this time  ? ?Next appointment: Follow up in one year for your annual wellness visit.  ? ?Preventive Care 71 Years and Older, Male ?Preventive care refers to lifestyle choices and visits with your health care provider that can promote health and wellness. ?What does preventive care include? ?A yearly physical exam. This is also called an annual well check. ?Dental exams once or twice a year. ?Routine eye exams. Ask your health care provider how often you should have your eyes checked. ?Personal lifestyle choices, including: ?Daily care of your teeth and gums. ?Regular physical activity. ?Eating a healthy diet. ?Avoiding tobacco and drug use. ?Limiting alcohol use. ?Practicing safe sex. ?Taking low doses of aspirin every day. ?Taking vitamin and mineral supplements as recommended by your health care provider. ?What happens during an annual well check? ?The services and screenings done by your health care provider during your annual well check will depend on your age, overall health,  lifestyle risk factors, and family history of disease. ?Counseling  ?Your health care provider may ask you questions about your: ?Alcohol use. ?Tobacco use. ?Drug use. ?Emotional well-being. ?Home and relationship well-being. ?Sexual activity. ?Eating habits. ?History of falls. ?Memory and ability to understand (cognition). ?Work and work Statistician. ?Screening  ?You may have the following tests or measurements: ?Height, weight, and BMI. ?Blood pressure. ?Lipid and cholesterol levels. These may be checked every 5 years, or more frequently if you are over 16 years old. ?Skin check. ?Lung cancer screening. You may have this screening every year starting at age 9 if you have a 30-pack-year history of smoking and currently smoke or have quit within the past 15 years. ?Fecal occult blood test (FOBT) of the stool. You may have this test every year starting at age 34. ?Flexible sigmoidoscopy or colonoscopy. You may have a sigmoidoscopy every 5 years or a colonoscopy every 10 years starting at age 40. ?Prostate cancer screening. Recommendations will vary depending on your family history and other risks. ?Hepatitis C blood test. ?Hepatitis B blood test. ?Sexually transmitted disease (STD) testing. ?Diabetes screening. This is done by checking your blood sugar (glucose) after you have not eaten for a while (fasting). You may have this done every 1-3 years. ?Abdominal aortic aneurysm (AAA) screening. You may need this if you are a current or former smoker. ?Osteoporosis. You may be screened starting at age 57 if you are at high risk. ?Talk with your health care provider about your test results, treatment options, and if necessary, the need for more tests. ?Vaccines  ?Your health care provider may recommend certain vaccines, such as: ?Influenza vaccine. This is recommended every year. ?Tetanus, diphtheria, and acellular  pertussis (Tdap, Td) vaccine. You may need a Td booster every 10 years. ?Zoster vaccine. You may need this  after age 50. ?Pneumococcal 13-valent conjugate (PCV13) vaccine. One dose is recommended after age 67. ?Pneumococcal polysaccharide (PPSV23) vaccine. One dose is recommended after age 32. ?Talk to your health care provider about which screenings and vaccines you need and how often you need them. ?This information is not intended to replace advice given to you by your health care provider. Make sure you discuss any questions you have with your health care provider. ?Document Released: 01/01/2016 Document Revised: 08/24/2016 Document Reviewed: 10/06/2015 ?Elsevier Interactive Patient Education ? 2017 [place]. ? ?Fall Prevention in the Home ?Falls can cause injuries. They can happen to people of all ages. There are many things you can do to make your home safe and to help prevent falls. ?What can I do on the outside of my home? ?Regularly fix the edges of walkways and driveways and fix any cracks. ?Remove anything that might make you trip as you walk through a door, such as a raised step or threshold. ?Trim any bushes or trees on the path to your home. ?Use bright outdoor lighting. ?Clear any walking paths of anything that might make someone trip, such as rocks or tools. ?Regularly check to see if handrails are loose or broken. Make sure that both sides of any steps have handrails. ?Any raised decks and porches should have guardrails on the edges. ?Have any leaves, snow, or ice cleared regularly. ?Use sand or salt on walking paths during winter. ?Clean up any spills in your garage right away. This includes oil or grease spills. ?What can I do in the bathroom? ?Use night lights. ?Install grab bars by the toilet and in the tub and shower. Do not use towel bars as grab bars. ?Use non-skid mats or decals in the tub or shower. ?If you need to sit down in the shower, use a plastic, non-slip stool. ?Keep the floor dry. Clean up any water that spills on the floor as soon as it happens. ?Remove soap buildup in the tub or  shower regularly. ?Attach bath mats securely with double-sided non-slip rug tape. ?Do not have throw rugs and other things on the floor that can make you trip. ?What can I do in the bedroom? ?Use night lights. ?Make sure that you have a light by your bed that is easy to reach. ?Do not use any sheets or blankets that are too big for your bed. They should not hang down onto the floor. ?Have a firm chair that has side arms. You can use this for support while you get dressed. ?Do not have throw rugs and other things on the floor that can make you trip. ?What can I do in the kitchen? ?Clean up any spills right away. ?Avoid walking on wet floors. ?Keep items that you use a lot in easy-to-reach places. ?If you need to reach something above you, use a strong step stool that has a grab bar. ?Keep electrical cords out of the way. ?Do not use floor polish or wax that makes floors slippery. If you must use wax, use non-skid floor wax. ?Do not have throw rugs and other things on the floor that can make you trip. ?What can I do with my stairs? ?Do not leave any items on the stairs. ?Make sure that there are handrails on both sides of the stairs and use them. Fix handrails that are broken or loose. Make sure that handrails  are as long as the stairways. ?Check any carpeting to make sure that it is firmly attached to the stairs. Fix any carpet that is loose or worn. ?Avoid having throw rugs at the top or bottom of the stairs. If you do have throw rugs, attach them to the floor with carpet tape. ?Make sure that you have a light switch at the top of the stairs and the bottom of the stairs. If you do not have them, ask someone to add them for you. ?What else can I do to help prevent falls? ?Wear shoes that: ?Do not have high heels. ?Have rubber bottoms. ?Are comfortable and fit you well. ?Are closed at the toe. Do not wear sandals. ?If you use a stepladder: ?Make sure that it is fully opened. Do not climb a closed stepladder. ?Make  sure that both sides of the stepladder are locked into place. ?Ask someone to hold it for you, if possible. ?Clearly mark and make sure that you can see: ?Any grab bars or handrails. ?First and last steps. ?W

## 2022-04-13 NOTE — Progress Notes (Addendum)
? ?Subjective:  ? Shaun Knight is a 71 y.o. male who presents for Medicare Annual/Subsequent preventive examination. ? ?Review of Systems    ? ?Cardiac Risk Factors include: advanced age (>54mn, >>34women);diabetes mellitus;male gender;obesity (BMI >30kg/m2);dyslipidemia;hypertension ? ?   ?Objective:  ?  ?Today's Vitals  ? 04/13/22 0833  ?BP: 120/80  ?Pulse: 70  ?Temp: 98 ?F (36.7 ?C)  ?SpO2: 97%  ?Weight: 204 lb 1.9 oz (92.6 kg)  ? ?Body mass index is 30.58 kg/m?. ? ? ?  04/13/2022  ?  8:43 AM 08/24/2021  ?  6:55 PM 03/31/2021  ?  8:15 AM 10/08/2019  ?  5:03 PM 08/25/2017  ? 10:59 AM  ?Advanced Directives  ?Does Patient Have a Medical Advance Directive? Yes No Yes No Yes  ?Type of AParamedicof [place]Living will  Living will  ?Copy of [place]? No - copy requested  No - copy requested    ? ? ?Current Medications (verified) ?Outpatient Encounter Medications as of 04/13/2022  ?Medication Sig  ? atorvastatin (LIPITOR) 80 MG tablet TAKE 1 TABLET(80 MG) BY MOUTH DAILY  ? Continuous Blood Gluc Sensor (FREESTYLE LIBRE 3 SENSOR) MISC 1 each by Does not apply route as directed. Place 1 sensor on the skin every 14 days. Use to check glucose continuously  ? fenofibrate (TRICOR) 145 MG tablet TAKE 1 TABLET(145 MG) BY MOUTH DAILY  ? hydrochlorothiazide (HYDRODIURIL) 25 MG tablet TAKE 1 TABLET BY MOUTH DAILY  ? Insulin Pen Needle (PEN NEEDLES) 31G X 5 MM MISC 1 each by Does not apply route daily.  ? metFORMIN (GLUCOPHAGE) 1000 MG tablet TAKE 1 TABLET(1000 MG) BY MOUTH TWICE DAILY  ? pantoprazole (PROTONIX) 40 MG tablet TAKE 1 TABLET BY MOUTH DAILY  ? TRESIBA FLEXTOUCH 100 UNIT/ML FlexTouch Pen 20 units SQ bid  ? ?No facility-administered encounter medications on file as of 04/13/2022.  ? ? ?Allergies (verified) ?Patient has no known allergies.  ? ?History: ?Past Medical History:  ?Diagnosis Date  ? Adenomatous colon polyp 04/2006; 2018  ? 2007 Dr.  OLajoyce Corners 2018 Dr. JStefano Gaul5/2023.  ? Chronic renal insufficiency, stage II (mild) 2017  ? CrCl 60s  ? Diabetes mellitus   ? Diverticulosis of colon   ? hospitalized [place] diverticulitis [place] past; also had episode 08/27/14  ? Fatty liver 2015  ? Noted on noncontrast CT done for eval for kidney stones  ? History of ETT   ? LOW RISK/NEG ETT 02/2011 w/Dr. TDoreatha Lew ? History of hemorrhoids   ? Hyperlipidemia   ? Crestor caused leg cramps  ? Hypertension   ? Left foot pain   ? medial: post tibial tendonitis---podiatrist did sheath injection 03/2018 and 02/2019  ? Nephrolithiasis   [place] Left mid ureteral stone 01/2016--ED visit.  ? Prostate nodule 08/2018  ? Referred to urol (PSA was normal/unchanged from prev year).  ? Renal cyst, left   ? hemorrhagic vs proteinacious (last f/u CT 2007 showed stability)--Dr. OLajoyce Corners  ? Shoulder pain, right    ? [place] 2012  ? ?Past Surgical History:  ?Procedure Laterality Date  ? CHALAZION EXCISION  summer 2015  ? COLONOSCOPY W/ POLYPECTOMY  04/2006; 04/2017  ? 2007: Adenomatous polyps x 2; recall 5 yrs (Dr. OLajoyce Corners.  Repeat 04/2017 (Dr. JPhilomena Dohenyadenoma x1 + sigmoid diverticulosis).  Recall 5 yrs.  ? ESOPHAGOGASTRODUODENOSCOPY  2008  ?  Esophagitis/gastritis/GERD.  H pylori and Barrett's NEG.  ? HEMORRHOID SURGERY    ? NOSE SURGERY    ? deviated septum  ? ?Family History  ?Problem Relation Age of Onset  ? Diabetes Mother   ? Hypertension Mother   ? Coronary artery disease Mother   ? Diabetes Father   ? Hypertension Father   ? Coronary artery disease Father   ? Lymphoma Father   ?     non-hodgkin  ? Obesity Daughter   ? Cancer Paternal Grandmother   ?     unknown  ? ?Social History  ? ?Socioeconomic History  ? Marital status: Married  ?  Spouse name: Not on file  ? Number of children: Not on file  ? Years of education: Not on file  ? Highest education level: Not on file  ?Occupational History  ? Not on  file  ?Tobacco Use  ? Smoking status: Never  ? Smokeless tobacco: Never  ?Vaping Use  ? Vaping Use: Never used  ?Substance and Sexual Activity  ? Alcohol use: No  ? Drug use: No  ? Sexual activity: Yes  ?  Partners: Female  ?Other Topics Concern  ? Not on file  ?Social History Narrative  ? Married, 1 son and 1 daughter.  ? Occup: Armed forces operational officer Engineer, drilling, Personal assistant).  ? No T/A/Ds.  ? ?Social Determinants of Health  ? ?Financial Resource Strain: Low Risk   ? Difficulty of Paying Living Expenses: Not hard at all  ?Food Insecurity: No Food Insecurity  ? Worried About Charity fundraiser in the Last Year: Never true  ? Ran Out of Food in the Last Year: Never true  ?Transportation Needs: No Transportation Needs  ? Lack of Transportation (Medical): No  ? Lack of Transportation (Non-Medical): No  ?Physical Activity: Inactive  ? Days of Exercise per Week: 0 days  ? Minutes of Exercise per Session: 0 min  ?Stress: No Stress Concern Present  ? Feeling of Stress : Not at all  ?Social Connections: Socially Integrated  ? Frequency of Communication with Friends and Family: More than three times a week  ? Frequency of Social Gatherings with Friends and Family: More than three times a week  ? Attends Religious Services: More than 4 times per year  ? Active Member of Clubs or Organizations: Yes  ? Attends Archivist Meetings: More than 4 times per year  ? Marital Status: Married  ? ? ?Tobacco Counseling ?Counseling given: Not Answered ? ? ?Clinical Intake: ? ?Pre-visit preparation completed: Yes ? ?Pain : No/denies pain ? ?  ? ?BMI - recorded: 30.58 ?Nutritional Status: BMI > 30  Obese ?Nutritional Risks: None ?Diabetes: Yes ?CBG done?: Yes (111) ?CBG resulted in Enter/ Edit results?: No ?Did pt. bring in CBG monitor from home?: No ? ?How often do you need to have someone help you when you read instructions, pamphlets, or other written materials from your doctor or pharmacy?: 1 -  Never ? ?Diabetic?Nutrition Risk Assessment: ? ?Has the patient had any N/V/D within the last 2 months?  No  ?Does the patient have any non-healing wounds?  No  ?Has the patient had any unintentional weight loss or weight gain?  No  ? ?Diabetes: ? ?Is the patient diabetic?  Yes  ?If diabetic, was a CBG obtained today?  Yes  ?Did the patient bring in their glucometer from home?  Yes  ?How often do you monitor your CBG's? Daily .  ? ?  Financial Strains and Diabetes Management: ? ?Are you having any financial strains with the device, your supplies or your medication? No .  ?Does the patient want to be seen by Chronic Care Management for management of their diabetes?  No  ?Would the patient like to be referred to a Nutritionist or for Diabetic Management?  No  ? ?Diabetic Exams: ? ?Diabetic Eye Exam: Completed 05/14/21 ?Diabetic Foot Exam: Completed 12/30/21 ? ? ?Interpreter Needed?: No ? ?Information entered by :: Charlott Rakes, LPN ? ? ?Activities of Daily Living ? ?  04/13/2022  ?  8:44 AM  ?In your present state of health, do you have any difficulty performing the following activities:  ?Hearing? 0  ?Vision? 0  ?Difficulty concentrating or making decisions? 0  ?Walking or climbing stairs? 0  ?Dressing or bathing? 0  ?Doing errands, shopping? 0  ?Preparing Food and eating ? N  ?Using the Toilet? N  ?In the past six months, have you accidently leaked urine? N  ?Do you have problems with loss of bowel control? N  ?Managing your Medications? N  ?Managing your Finances? N  ?Housekeeping or managing your Housekeeping? N  ? ? ?Patient Care Team: ?McGowen, Adrian Blackwater, MD as PCP - General (Family Medicine) ?Milus Banister, MD as Consulting Physician (Gastroenterology) ?Wallene Huh, DPM as Consulting Physician (Podiatry) ?Gregor Hams, MD as Consulting Physician (Sports Medicine) ? ?Indicate any recent Medical Services you may have received from other than [facility] providers in the past year (date may be approximate). ? ?    ?Assessment:  ? This is a routine wellness examination for Shaun Knight. ? ?Hearing/Vision screen ?Hearing Screening - Comments:: Pt denies any hearing issues  ?Vision Screening - Comments:: Pt follows up with [facility]

## 2022-05-11 ENCOUNTER — Encounter: Payer: Self-pay | Admitting: Gastroenterology

## 2022-06-30 ENCOUNTER — Ambulatory Visit: Payer: Medicare Other | Admitting: Family Medicine

## 2022-06-30 DIAGNOSIS — I10 Essential (primary) hypertension: Secondary | ICD-10-CM

## 2022-06-30 DIAGNOSIS — E7800 Pure hypercholesterolemia, unspecified: Secondary | ICD-10-CM

## 2022-06-30 DIAGNOSIS — E119 Type 2 diabetes mellitus without complications: Secondary | ICD-10-CM

## 2022-06-30 NOTE — Progress Notes (Deleted)
OFFICE VISIT  06/30/2022  CC: No chief complaint on file.   Patient is a 71 y.o. male who presents for 62-monthfollow-up diabetes, hypertension, and hyperlipidemia. A/P as of last visit: "#1 diabetes without complication. He is compliant with Tresiba 10 units twice daily and metformin 1000 mg twice a day. Feet exam normal today. Hemoglobin A1c and basic metabolic panel today.  2.  Hypertension.  Well-controlled on HCTZ 25 mg a day. Electrolytes and creatinine today.  3.  Hyperlipidemia, mixed. 80 mg atorvastatin daily and fenofibrate 145 daily. Not fasting today so we will defer lipid check until next visit in 6 months. Hepatic panel today.  4. Preventative health care: Vaccines--Shingrix-rx to pharmacy. Prostate cancer screening--PSA today. Colon cancer screening--due for repeat colonoscopy around May this year"  INTERIM HX: ***  Past Medical History:  Diagnosis Date   Adenomatous colon polyp 04/2006; 2018   2007 Dr. OLajoyce Corners 2018 Dr. JStefano Gaul5/2023.   Chronic renal insufficiency, stage II (mild) 2017   CrCl 60s   Diabetes mellitus    Diverticulosis of colon    hospitalized for diverticulitis in remote past; also had episode 08/27/14   Fatty liver 2015   Noted on noncontrast CT done for eval for kidney stones   History of ETT    LOW RISK/NEG ETT 02/2011 w/Dr. TDoreatha Lew  History of hemorrhoids    Hyperlipidemia    Crestor caused leg cramps   Hypertension    Left foot pain    medial: post tibial tendonitis---podiatrist did sheath injection 03/2018 and 02/2019   Nephrolithiasis    [place] Left mid ureteral stone 01/2016--ED visit.   Prostate nodule 08/2018   Referred to urol (PSA was normal/unchanged from prev year).   Renal cyst, left    hemorrhagic vs proteinacious (last f/u CT 2007 showed stability)--Dr. OLajoyce Corners   Shoulder pain, right     [facility] [place] [facility] [place] [facility]. DM[place] Sa 2012    Past Surgical History:   Procedure Laterality Date   CHALAZION EXCISION  summer 2015   COLONOSCOPY W/ POLYPECTOMY  04/2006; 04/2017   2007: Adenomatous polyps x 2; recall 5 yrs (Dr. OLajoyce Corners.  Repeat 04/2017 (Dr. JPhilomena Dohenyadenoma x1 + sigmoid diverticulosis).  Recall 5 yrs.   ESOPHAGOGASTRODUODENOSCOPY  2008   Esophagitis/gastritis/GERD.  H pylori and Barrett's NEG.   HEMORRHOID SURGERY     NOSE SURGERY     deviated septum    Outpatient Medications Prior to Visit  Medication Sig Dispense Refill   atorvastatin (LIPITOR) 80 MG tablet TAKE 1 TABLET(80 MG) BY MOUTH DAILY 90 tablet 1   Continuous Blood Gluc Sensor (FREESTYLE LIBRE 3 SENSOR) MISC 1 each by Does not apply route as directed. Place 1 sensor on the skin every 14 days. Use to check glucose continuously 3 each 5   fenofibrate (TRICOR) 145 MG tablet TAKE 1 TABLET(145 MG) BY MOUTH DAILY 90 tablet 1   hydrochlorothiazide (HYDRODIURIL) 25 MG tablet TAKE 1 TABLET BY MOUTH DAILY 90 tablet 1   Insulin Pen Needle (PEN NEEDLES) 31G X 5 MM MISC 1 each by Does not apply route daily. 100 each 5   metFORMIN (GLUCOPHAGE) 1000 MG tablet TAKE 1 TABLET(1000 MG) BY MOUTH TWICE DAILY 180 tablet 1   pantoprazole (PROTONIX) 40 MG tablet TAKE 1 TABLET BY MOUTH DAILY 90 tablet 1   TRESIBA FLEXTOUCH 100 UNIT/ML FlexTouch Pen 20 units SQ bid 15 mL 6   No facility-administered medications prior to visit.  No Known Allergies  ROS As per HPI  PE:    04/13/2022    8:33 AM 12/30/2021    8:44 AM 08/24/2021   11:52 PM  Vitals with BMI  Height  5' 8.5"   Weight 204 lbs 2 oz 199 lbs 10 oz   BMI  61.9   Systolic 509 326 712  Diastolic 80 86 78  Pulse 70 71 88     Physical Exam  ***  LABS:  Last CBC Lab Results  Component Value Date   WBC 10.7 (H) 08/24/2021   HGB 17.0 08/24/2021   HCT 48.7 08/24/2021   MCV 88.9 08/24/2021   MCH 31.0 08/24/2021   RDW 12.6 08/24/2021   PLT 248 45/80/9983   Last metabolic panel Lab Results  Component Value Date   GLUCOSE 115  (H) 12/30/2021   NA 138 12/30/2021   K 4.0 12/30/2021   CL 97 12/30/2021   CO2 33 (H) 12/30/2021   BUN 21 12/30/2021   CREATININE 1.29 12/30/2021   GFRNONAA 53 (L) 08/24/2021   CALCIUM 9.7 12/30/2021   PROT 7.1 12/30/2021   ALBUMIN 4.5 12/30/2021   BILITOT 0.8 12/30/2021   ALKPHOS 23 (L) 12/30/2021   AST 31 12/30/2021   ALT 28 12/30/2021   ANIONGAP 10 08/24/2021   Last lipids Lab Results  Component Value Date   CHOL 220 (H) 07/01/2021   HDL 42.00 07/01/2021   LDLCALC 146 (H) 07/01/2021   LDLDIRECT 127.0 12/31/2020   TRIG 159.0 (H) 07/01/2021   CHOLHDL 5 07/01/2021   Last hemoglobin A1c Lab Results  Component Value Date   HGBA1C 7.0 (H) 12/30/2021   Last thyroid functions Lab Results  Component Value Date   TSH 1.22 08/28/2018   Lab Results  Component Value Date   PSA 0.66 12/30/2021   PSA 0.45 12/31/2020   PSA 0.60 09/17/2019   IMPRESSION AND PLAN:  No problem-specific Assessment & Plan notes found for this encounter.  Colonoscopy recall  An After Visit Summary was printed and given to the patient.  FOLLOW UP: No follow-ups on file.  Signed:  Crissie Sickles, MD           06/30/2022

## 2022-07-04 ENCOUNTER — Other Ambulatory Visit: Payer: Self-pay

## 2022-07-04 ENCOUNTER — Telehealth: Payer: Self-pay

## 2022-07-04 NOTE — Telephone Encounter (Signed)
Pt will need to make scheduled appt

## 2022-07-04 NOTE — Telephone Encounter (Signed)
Patient called to say he missed his appt last week with Dr. Anitra Lauth, and stated he guesses that is why none of his prescription refills were approved by Dr. Anitra Lauth. I asked what medications he needed to refill and he said "all of them".  He said that pharmacy is telling him they have not heard from our office.  I could not find any refill requests on his profile.   He stated he was out of all meds.  Again, he doesn't know name of medications. Patient scheduled appt with Dr. Anitra Lauth for  Wednesday 7/19 at 2pm.  Please call patient at [phone].

## 2022-07-06 ENCOUNTER — Ambulatory Visit (INDEPENDENT_AMBULATORY_CARE_PROVIDER_SITE_OTHER): Payer: Medicare Other | Admitting: Family Medicine

## 2022-07-06 ENCOUNTER — Encounter: Payer: Self-pay | Admitting: Family Medicine

## 2022-07-06 VITALS — BP 114/76 | HR 71 | Temp 98.4°F | Wt 200.2 lb

## 2022-07-06 DIAGNOSIS — I10 Essential (primary) hypertension: Secondary | ICD-10-CM | POA: Diagnosis not present

## 2022-07-06 DIAGNOSIS — E119 Type 2 diabetes mellitus without complications: Secondary | ICD-10-CM | POA: Diagnosis not present

## 2022-07-06 DIAGNOSIS — E7800 Pure hypercholesterolemia, unspecified: Secondary | ICD-10-CM | POA: Diagnosis not present

## 2022-07-06 DIAGNOSIS — J309 Allergic rhinitis, unspecified: Secondary | ICD-10-CM | POA: Diagnosis not present

## 2022-07-06 DIAGNOSIS — R051 Acute cough: Secondary | ICD-10-CM

## 2022-07-06 LAB — POCT GLYCOSYLATED HEMOGLOBIN (HGB A1C)
HbA1c POC (<> result, manual entry): 6.8 % (ref 4.0–5.6)
HbA1c, POC (controlled diabetic range): 6.8 % (ref 0.0–7.0)
HbA1c, POC (prediabetic range): 6.8 % — AB (ref 5.7–6.4)
Hemoglobin A1C: 6.8 % — AB (ref 4.0–5.6)

## 2022-07-06 MED ORDER — HYDROCHLOROTHIAZIDE 25 MG PO TABS: 25.0000 mg | ORAL_TABLET | Freq: Every day | ORAL | 1 refills | Status: DC

## 2022-07-06 MED ORDER — ATORVASTATIN CALCIUM 80 MG PO TABS: ORAL_TABLET | ORAL | 1 refills | Status: DC

## 2022-07-06 MED ORDER — FENOFIBRATE 145 MG PO TABS: ORAL_TABLET | ORAL | 1 refills | Status: DC

## 2022-07-06 MED ORDER — METFORMIN HCL 1000 MG PO TABS: ORAL_TABLET | ORAL | 1 refills | Status: DC

## 2022-07-06 MED ORDER — HYDROCODONE BIT-HOMATROP MBR 5-1.5 MG/5ML PO SOLN: ORAL | 0 refills | Status: DC

## 2022-07-06 MED ORDER — PANTOPRAZOLE SODIUM 40 MG PO TBEC: 40.0000 mg | DELAYED_RELEASE_TABLET | Freq: Every day | ORAL | 1 refills | Status: DC

## 2022-07-06 NOTE — Patient Instructions (Signed)
[facility]  Address:  [address], [place], [place] [address]  Phone: [phone]  

## 2022-07-06 NOTE — Progress Notes (Signed)
OFFICE VISIT  07/06/2022  CC:  Chief Complaint  Patient presents with   Diabetes    Pt is fasting   HPI:    Patient is a 71 y.o. male who presents for 6 mo f/u DM, HTN, HLD. A/P as of last visit: "#1 diabetes without complication. He is compliant with Tresiba 10 units twice daily and metformin 1000 mg twice a day. Feet exam normal today. Hemoglobin A1c and basic metabolic panel today.  2.  Hypertension.  Well-controlled on HCTZ 25 mg a day. Electrolytes and creatinine today.  3.  Hyperlipidemia, mixed. 80 mg atorvastatin daily and fenofibrate 145 daily. Not fasting today so we will defer lipid check until next visit in 6 months. Hepatic panel today.  4. Preventative health care: Vaccines--Shingrix-rx to pharmacy. Prostate cancer screening--PSA today. Colon cancer screening--due for repeat colonoscopy around May this year"  INTERIM HX: Shaun Knight feels good other than a cough lately. 5 days ago he was mowing and collecting hay and his tractor broke down so he had to get out in the dust a lot. That evening he started coughing a lot and feeling like he was dry in his throat.  The cough is dry. The cough is continued but he denies any significant nasal congestion or runny nose as well as any fevers. He does not feel like he is wheezing or short of breath. He does not feel any fever, muscle aches, or malaise.  DM: His hemoglobin A1c last visit was the best it has been in 10 years-->7%. He shows me his glucose average on his phone today and it was 127. He has been taking 15 units of Tresiba in the morning and 15 in the evening.  Also taking metformin 1000 mg twice a day. No home blood pressure monitoring.  He is compliant with his fenofibrate and atorvastatin.  ROS as above, plus-->  no CP,  no dizziness, no HAs, no rashes, no melena/hematochezia.  No polyuria or polydipsia.  No myalgias or arthralgias.  No focal weakness, paresthesias, or tremors.  No acute vision or hearing  abnormalities.  No dysuria or unusual/new urinary urgency or frequency.  No recent changes in lower legs. No n/v/d or abd pain.  No palpitations.    Past Medical History:  Diagnosis Date   Adenomatous colon polyp 04/2006; 2018   2007 Dr. Lajoyce Corners; 2018 Dr. Stefano Gaul 04/2022.   Chronic renal insufficiency, stage II (mild) 2017   CrCl 60s   Diabetes mellitus    Diverticulosis of colon    hospitalized for diverticulitis in remote past; also had episode 08/27/14   Fatty liver 2015   Noted on noncontrast CT done for eval for kidney stones   History of ETT    LOW RISK/NEG ETT 02/2011 w/Dr. Doreatha Lew   History of hemorrhoids    Hyperlipidemia    Crestor caused leg cramps   Hypertension    Left foot pain    medial: post tibial tendonitis---podiatrist did sheath injection 03/2018 and 02/2019   Nephrolithiasis    Saw urologist in [place] past (?[facility]). Left mid ureteral stone 01/2016--ED visit.   Prostate nodule 08/2018   Referred to urol (PSA was normal/unchanged from prev year).   Renal cyst, left    hemorrhagic vs proteinacious (last f/u CT 2007 showed stability)--Dr. Lajoyce Corners.   Shoulder pain, right     Injection on 2 consec mo at [facility] (Dr. Marlou Sa) 2012    Past Surgical History:  Procedure Laterality Date   CHALAZION EXCISION  summer 2015  COLONOSCOPY W/ POLYPECTOMY  04/2006; 04/2017   2007: Adenomatous polyps x 2; recall 5 yrs (Dr. Lajoyce Corners).  Repeat 04/2017 (Dr. Philomena Doheny adenoma x1 + sigmoid diverticulosis).  Recall 5 yrs.   ESOPHAGOGASTRODUODENOSCOPY  2008   Esophagitis/gastritis/GERD.  H pylori and Barrett's NEG.   HEMORRHOID SURGERY     NOSE SURGERY     deviated septum    Outpatient Medications Prior to Visit  Medication Sig Dispense Refill   Continuous Blood Gluc Sensor (FREESTYLE LIBRE 3 SENSOR) MISC 1 each by Does not apply route as directed. Place 1 sensor on the skin every 14 days. Use to check glucose continuously 3 each 5   Insulin Pen Needle (PEN NEEDLES) 31G X 5 MM  MISC 1 each by Does not apply route daily. 100 each 5   TRESIBA FLEXTOUCH 100 UNIT/ML FlexTouch Pen 20 units SQ bid 15 mL 6   atorvastatin (LIPITOR) 80 MG tablet TAKE 1 TABLET(80 MG) BY MOUTH DAILY 90 tablet 1   fenofibrate (TRICOR) 145 MG tablet TAKE 1 TABLET(145 MG) BY MOUTH DAILY 90 tablet 1   hydrochlorothiazide (HYDRODIURIL) 25 MG tablet TAKE 1 TABLET BY MOUTH DAILY 90 tablet 1   metFORMIN (GLUCOPHAGE) 1000 MG tablet TAKE 1 TABLET(1000 MG) BY MOUTH TWICE DAILY 180 tablet 1   pantoprazole (PROTONIX) 40 MG tablet TAKE 1 TABLET BY MOUTH DAILY 90 tablet 1   No facility-administered medications prior to visit.    No Known Allergies  ROS As per HPI  PE:    07/06/2022    2:03 PM 04/13/2022    8:33 AM 12/30/2021    8:44 AM  Vitals with BMI  Height   5' 8.5"  Weight 200 lbs 3 oz 204 lbs 2 oz 199 lbs 10 oz  BMI   16.1  Systolic 096 045 409  Diastolic 76 80 86  Pulse 71 70 71  02 sat 93% on RA today  Physical Exam  Gen: Alert, well appearing.  Patient is oriented to person, place, time, and situation. ENT: Ears: EACs clear, normal epithelium.  TMs with good light reflex and landmarks bilaterally.  Eyes: no injection, icteris, swelling, or exudate.  EOMI, PERRLA. Nose: no drainage or turbinate edema/swelling.  No injection or focal lesion.  Mouth: lips without lesion/swelling.  Oral mucosa pink and moist.  Dentition intact and without obvious caries or gingival swelling.  Oropharynx without erythema, exudate, or swelling.  Neck - No masses or thyromegaly or limitation in range of motion CV: RRR, no m/r/g.   LUNGS: CTA bilat, nonlabored resps, good aeration in all lung fields. EXT: no clubbing or cyanosis.  no edema.    LABS:  Last CBC Lab Results  Component Value Date   WBC 10.7 (H) 08/24/2021   HGB 17.0 08/24/2021   HCT 48.7 08/24/2021   MCV 88.9 08/24/2021   MCH 31.0 08/24/2021   RDW 12.6 08/24/2021   PLT 248 81/19/1478   Last metabolic panel Lab Results  Component  Value Date   GLUCOSE 115 (H) 12/30/2021   NA 138 12/30/2021   K 4.0 12/30/2021   CL 97 12/30/2021   CO2 33 (H) 12/30/2021   BUN 21 12/30/2021   CREATININE 1.29 12/30/2021   GFRNONAA 53 (L) 08/24/2021   CALCIUM 9.7 12/30/2021   PROT 7.1 12/30/2021   ALBUMIN 4.5 12/30/2021   BILITOT 0.8 12/30/2021   ALKPHOS 23 (L) 12/30/2021   AST 31 12/30/2021   ALT 28 12/30/2021   ANIONGAP 10 08/24/2021   Last lipids Lab  Results  Component Value Date   CHOL 220 (H) 07/01/2021   HDL 42.00 07/01/2021   LDLCALC 146 (H) 07/01/2021   LDLDIRECT 127.0 12/31/2020   TRIG 159.0 (H) 07/01/2021   CHOLHDL 5 07/01/2021   Last hemoglobin A1c Lab Results  Component Value Date   HGBA1C 6.8 (A) 07/06/2022   HGBA1C 6.8 07/06/2022   HGBA1C 6.8 (A) 07/06/2022   HGBA1C 6.8 07/06/2022   Last thyroid functions Lab Results  Component Value Date   TSH 1.22 08/28/2018   Lab Results  Component Value Date   PSA 0.66 12/30/2021   PSA 0.45 12/31/2020   PSA 0.60 09/17/2019   IMPRESSION AND PLAN:  #1 allergic rhinitis with cough. He will continue his antihistamine and try adding saline nasal spray over-the-counter. Tessalon has never helped in the past and he has been taking Mucinex DM and it is not helped significantly. We will do trial of Hycodan syrup, 1 to 2 teaspoon twice daily as needed, #120 mils. Therapeutic expectations and side effect profile of medication discussed today.  Patient's questions answered.  #2 diabetes without complication, good control.   POC Hba1c today is 6.8%. Continue Tresiba 15 units twice a day as well as metformin 1000 mg twice daily. Urine microalbumin/creatinine and feet exam to be done at next follow-up in 6 months.  #3 hypertension, well controlled on HCTZ 25 mg a day. Electrolytes and creatinine today.  #4 mixed hyperlipidemia. Doing well on atorvastatin 80 mg a day and fenofibrate 145 mg a day. Lipid panel and hepatic panel today.  #5 Preventative  health: Shingrix-> he got #1 but needs #2. Hx colon polyps->due for rpt--> I reminded him of this today and gave him his gastroenterologist's office phone number.. Prostate ca screening: next PSA 39mo  An After Visit Summary was printed and given to the patient.  FOLLOW UP: Return in about 6 months (around 01/06/2023) for routine chronic illness f/u.  Signed:  PCrissie Sickles MD           07/06/2022

## 2022-07-07 LAB — LIPID PANEL
Cholesterol: 217 mg/dL — ABNORMAL HIGH (ref 0–200)
HDL: 40.6 mg/dL (ref >39.00)
LDL Cholesterol: 147 mg/dL — ABNORMAL HIGH (ref 0–99)
NonHDL: 176.78
Total CHOL/HDL Ratio: 5
Triglycerides: 149 mg/dL (ref 0.0–149.0)
VLDL: 29.8 mg/dL (ref 0.0–40.0)

## 2022-07-07 LAB — COMPREHENSIVE METABOLIC PANEL
ALT: 21 U/L (ref 0–53)
AST: 25 U/L (ref 0–37)
Albumin: 4.5 g/dL (ref 3.5–5.2)
Alkaline Phosphatase: 29 U/L — ABNORMAL LOW (ref 39–117)
BUN: 22 mg/dL (ref 6–23)
CO2: 32 mEq/L (ref 19–32)
Calcium: 9.8 mg/dL (ref 8.4–10.5)
Chloride: 95 mEq/L — ABNORMAL LOW (ref 96–112)
Creatinine, Ser: 1.3 mg/dL (ref 0.40–1.50)
GFR: 55.48 mL/min — ABNORMAL LOW (ref >60.00)
Glucose, Bld: 113 mg/dL — ABNORMAL HIGH (ref 70–99)
Potassium: 4.4 mEq/L (ref 3.5–5.1)
Sodium: 137 mEq/L (ref 135–145)
Total Bilirubin: 0.8 mg/dL (ref 0.2–1.2)
Total Protein: 7.2 g/dL (ref 6.0–8.3)

## 2022-07-08 ENCOUNTER — Telehealth: Payer: Self-pay

## 2022-07-08 DIAGNOSIS — E782 Mixed hyperlipidemia: Secondary | ICD-10-CM

## 2022-07-08 MED ORDER — EZETIMIBE 10 MG PO TABS: 10.0000 mg | ORAL_TABLET | Freq: Every day | ORAL | 0 refills | Status: DC

## 2022-07-08 NOTE — Telephone Encounter (Signed)
-----   Message from Tammi Sou, MD sent at 07/08/2022  8:15 AM EDT ----- Metabolic panel normal. Kidney function is stable. Cholesterol elevated, similar to past.  Make sure taking atorvastatin and fenofibrate every day.  We can add a 3rd medication to try to get his LDL cholesterol lower. If pt agreeable to this then rx zetia '10mg'$ , 1 tab qd, #30, RF x 1. Fasting lipid panel, AST, and ALT 3 mo, dx mixed hyperlipidemia.

## 2022-07-11 ENCOUNTER — Ambulatory Visit (INDEPENDENT_AMBULATORY_CARE_PROVIDER_SITE_OTHER): Payer: Medicare Other | Admitting: Family Medicine

## 2022-07-11 ENCOUNTER — Encounter: Payer: Self-pay | Admitting: Family Medicine

## 2022-07-11 ENCOUNTER — Ambulatory Visit: Payer: Medicare Other | Admitting: Family Medicine

## 2022-07-11 VITALS — BP 138/89 | HR 80 | Temp 98.9°F | Wt 198.4 lb

## 2022-07-11 DIAGNOSIS — J180 Bronchopneumonia, unspecified organism: Secondary | ICD-10-CM | POA: Diagnosis not present

## 2022-07-11 DIAGNOSIS — J069 Acute upper respiratory infection, unspecified: Secondary | ICD-10-CM | POA: Diagnosis not present

## 2022-07-11 MED ORDER — PREDNISONE 20 MG PO TABS: ORAL_TABLET | ORAL | 0 refills | Status: DC

## 2022-07-11 MED ORDER — BENZONATATE 200 MG PO CAPS: 200.0000 mg | ORAL_CAPSULE | Freq: Three times a day (TID) | ORAL | 0 refills | Status: DC | PRN

## 2022-07-11 MED ORDER — METHYLPREDNISOLONE ACETATE 80 MG/ML IJ SUSP
80.0000 mg | Freq: Once | INTRAMUSCULAR | Status: AC
  Administered 2022-07-11: 80 mg via INTRAMUSCULAR

## 2022-07-11 MED ORDER — DOXYCYCLINE HYCLATE 100 MG PO CAPS: 100.0000 mg | ORAL_CAPSULE | Freq: Two times a day (BID) | ORAL | 0 refills | Status: AC

## 2022-07-11 NOTE — Patient Instructions (Signed)
Start the prednisone tomorrow.

## 2022-07-11 NOTE — Progress Notes (Deleted)
OFFICE VISIT  07/11/2022  CC: No chief complaint on file.   Patient is a 71 y.o. male who presents for ongoing respiratory symptoms.  HPI: ***  Past Medical History:  Diagnosis Date   Adenomatous colon polyp 04/2006; 2018   2007 Dr. Lajoyce Corners; 2018 Dr. Stefano Gaul 04/2022.   Chronic renal insufficiency, stage II (mild) 2017   CrCl 60s   Diabetes mellitus    Diverticulosis of colon    hospitalized for diverticulitis in remote past; also had episode 08/27/14   Fatty liver 2015   Noted on noncontrast CT done for eval for kidney stones   History of ETT    LOW RISK/NEG ETT 02/2011 w/Dr. Doreatha Lew   History of hemorrhoids    Hyperlipidemia    Crestor caused leg cramps   Hypertension    Left foot pain    medial: post tibial tendonitis---podiatrist did sheath injection 03/2018 and 02/2019   Nephrolithiasis    Saw urologist in [place] past (?[facility]). Left mid ureteral stone 01/2016--ED visit.   Prostate nodule 08/2018   Referred to urol (PSA was normal/unchanged from prev year).   Renal cyst, left    hemorrhagic vs proteinacious (last f/u CT 2007 showed stability)--Dr. Lajoyce Corners.   Shoulder pain, right     Injection on 2 consec mo at [facility] (Dr. Marlou Sa) 2012    Past Surgical History:  Procedure Laterality Date   CHALAZION EXCISION  summer 2015   COLONOSCOPY W/ POLYPECTOMY  04/2006; 04/2017   2007: Adenomatous polyps x 2; recall 5 yrs (Dr. Lajoyce Corners).  Repeat 04/2017 (Dr. Philomena Doheny adenoma x1 + sigmoid diverticulosis).  Recall 5 yrs.   ESOPHAGOGASTRODUODENOSCOPY  2008   Esophagitis/gastritis/GERD.  H pylori and Barrett's NEG.   HEMORRHOID SURGERY     NOSE SURGERY     deviated septum    Outpatient Medications Prior to Visit  Medication Sig Dispense Refill   atorvastatin (LIPITOR) 80 MG tablet TAKE 1 TABLET(80 MG) BY MOUTH DAILY 90 tablet 1   Continuous Blood Gluc Sensor (FREESTYLE LIBRE 3 SENSOR) MISC 1 each by Does not apply route as directed. Place 1 sensor on the skin every 14 days.  Use to check glucose continuously 3 each 5   ezetimibe (ZETIA) 10 MG tablet Take 1 tablet (10 mg total) by mouth daily. 90 tablet 0   fenofibrate (TRICOR) 145 MG tablet TAKE 1 TABLET(145 MG) BY MOUTH DAILY 90 tablet 1   hydrochlorothiazide (HYDRODIURIL) 25 MG tablet Take 1 tablet (25 mg total) by mouth daily. 90 tablet 1   HYDROcodone bit-homatropine (HYCODAN) 5-1.5 MG/5ML syrup 1-2 tsp po bid prn cough 120 mL 0   Insulin Pen Needle (PEN NEEDLES) 31G X 5 MM MISC 1 each by Does not apply route daily. 100 each 5   metFORMIN (GLUCOPHAGE) 1000 MG tablet TAKE 1 TABLET(1000 MG) BY MOUTH TWICE DAILY 180 tablet 1   pantoprazole (PROTONIX) 40 MG tablet Take 1 tablet (40 mg total) by mouth daily. 90 tablet 1   TRESIBA FLEXTOUCH 100 UNIT/ML FlexTouch Pen 20 units SQ bid 15 mL 6   No facility-administered medications prior to visit.    No Known Allergies  ROS As per HPI  PE:    07/06/2022    2:03 PM 04/13/2022    8:33 AM 12/30/2021    8:44 AM  Vitals with BMI  Height   5' 8.5"  Weight 200 lbs 3 oz 204 lbs 2 oz 199 lbs 10 oz  BMI   24.5  Systolic 809 983 382  Diastolic 76 80 86  Pulse 71 70 71     Physical Exam  ***  LABS:  Last CBC Lab Results  Component Value Date   WBC 10.7 (H) 08/24/2021   HGB 17.0 08/24/2021   HCT 48.7 08/24/2021   MCV 88.9 08/24/2021   MCH 31.0 08/24/2021   RDW 12.6 08/24/2021   PLT 248 35/57/3220   Last metabolic panel Lab Results  Component Value Date   GLUCOSE 113 (H) 07/06/2022   NA 137 07/06/2022   K 4.4 07/06/2022   CL 95 (L) 07/06/2022   CO2 32 07/06/2022   BUN 22 07/06/2022   CREATININE 1.30 07/06/2022   GFRNONAA 53 (L) 08/24/2021   CALCIUM 9.8 07/06/2022   PROT 7.2 07/06/2022   ALBUMIN 4.5 07/06/2022   BILITOT 0.8 07/06/2022   ALKPHOS 29 (L) 07/06/2022   AST 25 07/06/2022   ALT 21 07/06/2022   ANIONGAP 10 08/24/2021   Last hemoglobin A1c Lab Results  Component Value Date   HGBA1C 6.8 (A) 07/06/2022   HGBA1C 6.8 07/06/2022    HGBA1C 6.8 (A) 07/06/2022   HGBA1C 6.8 07/06/2022   IMPRESSION AND PLAN:  No problem-specific Assessment & Plan notes found for this encounter.   An After Visit Summary was printed and given to the patient.  FOLLOW UP: No follow-ups on file.  Signed:  Crissie Sickles, MD           07/11/2022

## 2022-07-11 NOTE — Progress Notes (Signed)
OFFICE VISIT  07/11/2022  CC:  Chief Complaint  Patient presents with   Cough    Pt states it has gotten worse   Patient is a 71 y.o. male presenting for cough.  HPI: Bad coughing, nasal congestion, PND, facial and temples/forehead pressure. Going on about 12-13 days now, gradually worsening.  Appetite decreased.  Occasional posttussive emesis and/or emesis due to excessive mucus.  No nausea.  No diarrhea. No fevers.  No SOB, wheezing, sore throat, or CP. I rx'd him hycodan at the time of last visit 5 days ago-->no help.  ROS as above, plus-->no dizziness, no rashes, no melena/hematochezia.  No polyuria or polydipsia.  No myalgias or arthralgias.  No focal weakness, paresthesias, or tremors.  No acute vision or hearing abnormalities.  No dysuria or unusual/new urinary urgency or frequency.  No recent changes in lower legs. No abdominal pain.  No palpitations.    Past Medical History:  Diagnosis Date   Adenomatous colon polyp 04/2006; 2018   2007 Dr. Lajoyce Corners; 2018 Dr. Stefano Gaul 04/2022.   Chronic renal insufficiency, stage II (mild) 2017   CrCl 60s   Diabetes mellitus    Diverticulosis of colon    hospitalized for diverticulitis in remote past; also had episode 08/27/14   Fatty liver 2015   Noted on noncontrast CT done for eval for kidney stones   History of ETT    LOW RISK/NEG ETT 02/2011 w/Dr. Doreatha Lew   History of hemorrhoids    Hyperlipidemia    Crestor caused leg cramps   Hypertension    Left foot pain    medial: post tibial tendonitis---podiatrist did sheath injection 03/2018 and 02/2019   Nephrolithiasis    Saw urologist in [place] past (?[facility]). Left mid ureteral stone 01/2016--ED visit.   Prostate nodule 08/2018   Referred to urol (PSA was normal/unchanged from prev year).   Renal cyst, left    hemorrhagic vs proteinacious (last f/u CT 2007 showed stability)--Dr. Lajoyce Corners.   Shoulder pain, right     Injection on 2 consec mo at [facility] (Dr. Marlou Sa) 2012    Past  Surgical History:  Procedure Laterality Date   CHALAZION EXCISION  summer 2015   COLONOSCOPY W/ POLYPECTOMY  04/2006; 04/2017   2007: Adenomatous polyps x 2; recall 5 yrs (Dr. Lajoyce Corners).  Repeat 04/2017 (Dr. Philomena Doheny adenoma x1 + sigmoid diverticulosis).  Recall 5 yrs.   ESOPHAGOGASTRODUODENOSCOPY  2008   Esophagitis/gastritis/GERD.  H pylori and Barrett's NEG.   HEMORRHOID SURGERY     NOSE SURGERY     deviated septum    Outpatient Medications Prior to Visit  Medication Sig Dispense Refill   atorvastatin (LIPITOR) 80 MG tablet TAKE 1 TABLET(80 MG) BY MOUTH DAILY 90 tablet 1   Continuous Blood Gluc Sensor (FREESTYLE LIBRE 3 SENSOR) MISC 1 each by Does not apply route as directed. Place 1 sensor on the skin every 14 days. Use to check glucose continuously 3 each 5   ezetimibe (ZETIA) 10 MG tablet Take 1 tablet (10 mg total) by mouth daily. 90 tablet 0   fenofibrate (TRICOR) 145 MG tablet TAKE 1 TABLET(145 MG) BY MOUTH DAILY 90 tablet 1   hydrochlorothiazide (HYDRODIURIL) 25 MG tablet Take 1 tablet (25 mg total) by mouth daily. 90 tablet 1   HYDROcodone bit-homatropine (HYCODAN) 5-1.5 MG/5ML syrup 1-2 tsp po bid prn cough 120 mL 0   Insulin Pen Needle (PEN NEEDLES) 31G X 5 MM MISC 1 each by Does not apply route daily. 100 each 5  metFORMIN (GLUCOPHAGE) 1000 MG tablet TAKE 1 TABLET(1000 MG) BY MOUTH TWICE DAILY 180 tablet 1   pantoprazole (PROTONIX) 40 MG tablet Take 1 tablet (40 mg total) by mouth daily. 90 tablet 1   TRESIBA FLEXTOUCH 100 UNIT/ML FlexTouch Pen 20 units SQ bid 15 mL 6   No facility-administered medications prior to visit.    No Known Allergies  ROS As per HPI  PE:    07/11/2022    3:35 PM 07/06/2022    2:03 PM 04/13/2022    8:33 AM  Vitals with BMI  Weight 198 lbs 6 oz 200 lbs 3 oz 101 lbs 2 oz  Systolic 751 025 852  Diastolic 89 76 80  Pulse 80 71 70     Physical Exam  Gen: alert, NAD, NONTOXIC APPEARING. HEENT: eyes without injection, drainage, or  swelling.  Ears: EACs clear, TMs with normal light reflex and landmarks.  Nose: Clear rhinorrhea, with some dried, crusty exudate adherent to mildly injected mucosa.  No purulent d/c.  No paranasal sinus TTP.  No facial swelling.  Throat and mouth without focal lesion.  No pharyngial swelling, erythema, or exudate.   Neck: supple, no LAD.   LUNGS: Subtle soft inspiratory crackles right base, otherwise CTA bilat.  Good aeration.  Nonlabored resps.   CV: RRR, no m/r/g. EXT: no c/c/e SKIN: no rash   LABS:  Last CBC Lab Results  Component Value Date   WBC 10.7 (H) 08/24/2021   HGB 17.0 08/24/2021   HCT 48.7 08/24/2021   MCV 88.9 08/24/2021   MCH 31.0 08/24/2021   RDW 12.6 08/24/2021   PLT 248 77/82/4235   Last metabolic panel Lab Results  Component Value Date   GLUCOSE 113 (H) 07/06/2022   NA 137 07/06/2022   K 4.4 07/06/2022   CL 95 (L) 07/06/2022   CO2 32 07/06/2022   BUN 22 07/06/2022   CREATININE 1.30 07/06/2022   GFRNONAA 53 (L) 08/24/2021   CALCIUM 9.8 07/06/2022   PROT 7.2 07/06/2022   ALBUMIN 4.5 07/06/2022   BILITOT 0.8 07/06/2022   ALKPHOS 29 (L) 07/06/2022   AST 25 07/06/2022   ALT 21 07/06/2022   ANIONGAP 10 08/24/2021   Lab Results  Component Value Date   CHOL 217 (H) 07/06/2022   HDL 40.60 07/06/2022   LDLCALC 147 (H) 07/06/2022   LDLDIRECT 127.0 12/31/2020   TRIG 149.0 07/06/2022   CHOLHDL 5 07/06/2022   IMPRESSION AND PLAN:  URI with cough, prolonged. Question early infiltrate on the right base--- acute bronchopneumonia.  Doxycycline 100 mg twice daily x7 days. Depo-Medrol 80 mg IM in the office today. Start prednisone tomorrow: 40 mg a day x5 days, then 20 mg a day x5 days. Discussed with patient the fact that steroids can make his sugars go up by about 15% on average.  An After Visit Summary was printed and given to the patient.  FOLLOW UP: Return if symptoms worsen or fail to improve.  Signed:  Crissie Sickles, MD           07/11/2022

## 2022-07-18 ENCOUNTER — Encounter: Payer: Self-pay | Admitting: Family Medicine

## 2022-07-19 ENCOUNTER — Telehealth: Payer: Self-pay

## 2022-07-19 NOTE — Telephone Encounter (Signed)
Received chronic condition verification forms via fax. Placed on PCP desk for signature

## 2022-07-19 NOTE — Telephone Encounter (Signed)
Signed and put in box to go up front.  

## 2022-08-04 ENCOUNTER — Other Ambulatory Visit: Payer: Self-pay

## 2022-08-04 MED ORDER — FREESTYLE LIBRE 3 SENSOR MISC: 1.0000 | 2 refills | Status: DC

## 2022-09-07 ENCOUNTER — Other Ambulatory Visit: Payer: Self-pay

## 2022-09-07 MED ORDER — TRESIBA FLEXTOUCH 100 UNIT/ML ~~LOC~~ SOPN: PEN_INJECTOR | SUBCUTANEOUS | 3 refills | Status: DC

## 2022-09-21 DIAGNOSIS — E785 Hyperlipidemia, unspecified: Secondary | ICD-10-CM | POA: Diagnosis not present

## 2022-09-21 DIAGNOSIS — E669 Obesity, unspecified: Secondary | ICD-10-CM | POA: Diagnosis not present

## 2022-09-21 DIAGNOSIS — K219 Gastro-esophageal reflux disease without esophagitis: Secondary | ICD-10-CM | POA: Diagnosis not present

## 2022-09-21 DIAGNOSIS — Z794 Long term (current) use of insulin: Secondary | ICD-10-CM | POA: Diagnosis not present

## 2022-09-21 DIAGNOSIS — E1165 Type 2 diabetes mellitus with hyperglycemia: Secondary | ICD-10-CM | POA: Diagnosis not present

## 2022-09-21 DIAGNOSIS — E1169 Type 2 diabetes mellitus with other specified complication: Secondary | ICD-10-CM | POA: Diagnosis not present

## 2022-09-21 DIAGNOSIS — I10 Essential (primary) hypertension: Secondary | ICD-10-CM | POA: Diagnosis not present

## 2022-10-04 ENCOUNTER — Other Ambulatory Visit: Payer: Self-pay | Admitting: Family Medicine

## 2022-10-05 NOTE — Telephone Encounter (Signed)
Pt has upcoming lab appt 10/23, will decide if continuation on medication needed at that time.

## 2022-10-10 ENCOUNTER — Encounter: Payer: Self-pay | Admitting: Family Medicine

## 2022-10-10 ENCOUNTER — Other Ambulatory Visit (INDEPENDENT_AMBULATORY_CARE_PROVIDER_SITE_OTHER): Payer: HMO

## 2022-10-10 ENCOUNTER — Ambulatory Visit (INDEPENDENT_AMBULATORY_CARE_PROVIDER_SITE_OTHER): Payer: HMO | Admitting: Family Medicine

## 2022-10-10 VITALS — BP 122/79 | HR 67 | Temp 98.8°F | Ht 68.5 in | Wt 201.6 lb

## 2022-10-10 DIAGNOSIS — E782 Mixed hyperlipidemia: Secondary | ICD-10-CM

## 2022-10-10 DIAGNOSIS — J069 Acute upper respiratory infection, unspecified: Secondary | ICD-10-CM | POA: Diagnosis not present

## 2022-10-10 LAB — LIPID PANEL
Cholesterol: 203 mg/dL — ABNORMAL HIGH (ref 0–200)
HDL: 36.9 mg/dL — ABNORMAL LOW (ref >39.00)
LDL Cholesterol: 130 mg/dL — ABNORMAL HIGH (ref 0–99)
NonHDL: 165.9
Total CHOL/HDL Ratio: 5
Triglycerides: 179 mg/dL — ABNORMAL HIGH (ref 0.0–149.0)
VLDL: 35.8 mg/dL (ref 0.0–40.0)

## 2022-10-10 LAB — AST: AST: 25 U/L (ref 0–37)

## 2022-10-10 LAB — ALT: ALT: 22 U/L (ref 0–53)

## 2022-10-10 MED ORDER — HYDROCODONE BIT-HOMATROP MBR 5-1.5 MG/5ML PO SOLN: 5.0000 mL | Freq: Three times a day (TID) | ORAL | 0 refills | Status: DC | PRN

## 2022-10-10 NOTE — Progress Notes (Signed)
OFFICE VISIT  10/10/2022  CC:  Chief Complaint  Patient presents with   Cough    Dry cough, started 3 days ago. He has been coughing so much, his ribs hurt.     Patient is a 71 y.o. male who presents for cough.  INTERIM HX: Onset 5 days ago of nasal congestion/runny nose, PND cough.  No fever, no wheezing, no shortness of breath. Now his cough feels deeper and sometimes he goes into coughing fits.  Keeps him up at night.  Over-the-counter cough medicine no help.  States he does not feel sick at all.  Past Medical History:  Diagnosis Date   Adenomatous colon polyp 04/2006; 2018   2007 Dr. Lajoyce Corners; 2018 Dr. Stefano Gaul 04/2022.   Chronic renal insufficiency, stage II (mild) 2017   CrCl 60s   Diabetes mellitus    Diverticulosis of colon    hospitalized for diverticulitis in remote past; also had episode 08/27/14   Fatty liver 2015   Noted on noncontrast CT done for eval for kidney stones   History of ETT    LOW RISK/NEG ETT 02/2011 w/Dr. Doreatha Lew   History of hemorrhoids    Hyperlipidemia    Crestor caused leg cramps   Hypertension    Left foot pain    medial: post tibial tendonitis---podiatrist did sheath injection 03/2018 and 02/2019   Nephrolithiasis    Saw urologist in [place] past (?[facility]). Left mid ureteral stone 01/2016--ED visit.   Prostate nodule 08/2018   Referred to urol (PSA was normal/unchanged from prev year).   Renal cyst, left    hemorrhagic vs proteinacious (last f/u CT 2007 showed stability)--Dr. Lajoyce Corners.   Shoulder pain, right     Injection on 2 consec mo at [facility] (Dr. Marlou Sa) 2012    Past Surgical History:  Procedure Laterality Date   CHALAZION EXCISION  summer 2015   COLONOSCOPY W/ POLYPECTOMY  04/2006; 04/2017   2007: Adenomatous polyps x 2; recall 5 yrs (Dr. Lajoyce Corners).  Repeat 04/2017 (Dr. Philomena Doheny adenoma x1 + sigmoid diverticulosis).  Recall 5 yrs.   ESOPHAGOGASTRODUODENOSCOPY  2008   Esophagitis/gastritis/GERD.  H pylori and Barrett's NEG.    HEMORRHOID SURGERY     NOSE SURGERY     deviated septum    Outpatient Medications Prior to Visit  Medication Sig Dispense Refill   atorvastatin (LIPITOR) 80 MG tablet TAKE 1 TABLET(80 MG) BY MOUTH DAILY 90 tablet 1   Continuous Blood Gluc Sensor (FREESTYLE LIBRE 3 SENSOR) MISC 1 each by Does not apply route as directed. Place 1 sensor on the skin every 14 days. Use to check glucose continuously 3 each 2   ezetimibe (ZETIA) 10 MG tablet Take 1 tablet (10 mg total) by mouth daily. 90 tablet 0   fenofibrate (TRICOR) 145 MG tablet TAKE 1 TABLET(145 MG) BY MOUTH DAILY 90 tablet 1   hydrochlorothiazide (HYDRODIURIL) 25 MG tablet Take 1 tablet (25 mg total) by mouth daily. 90 tablet 1   Insulin Pen Needle (PEN NEEDLES) 31G X 5 MM MISC 1 each by Does not apply route daily. 100 each 5   metFORMIN (GLUCOPHAGE) 1000 MG tablet TAKE 1 TABLET(1000 MG) BY MOUTH TWICE DAILY 180 tablet 1   pantoprazole (PROTONIX) 40 MG tablet Take 1 tablet (40 mg total) by mouth daily. 90 tablet 1   TRESIBA FLEXTOUCH 100 UNIT/ML FlexTouch Pen 15 units SQ bid 15 mL 3   benzonatate (TESSALON) 200 MG capsule Take 1 capsule (200 mg total) by mouth 3 (three) times  daily as needed for cough. (Patient not taking: Reported on 10/10/2022) 20 capsule 0   HYDROcodone bit-homatropine (HYCODAN) 5-1.5 MG/5ML syrup 1-2 tsp po bid prn cough (Patient not taking: Reported on 10/10/2022) 120 mL 0   predniSONE (DELTASONE) 20 MG tablet 2 tabs po qd x 5d, then 1 tab po qd x 5d (Patient not taking: Reported on 10/10/2022) 15 tablet 0   No facility-administered medications prior to visit.    No Known Allergies  ROS As per HPI  PE:    10/10/2022    8:15 AM 07/11/2022    3:35 PM 07/06/2022    2:03 PM  Vitals with BMI  Height 5' 8.5"    Weight 201 lbs 10 oz 198 lbs 6 oz 200 lbs 3 oz  BMI 48.5    Systolic 462 703 500  Diastolic 79 89 76  Pulse 67 80 71     Physical Exam  VS: noted--normal. Gen: alert, NAD, NONTOXIC  APPEARING. HEENT: eyes without injection, drainage, or swelling.  Ears: EACs clear, TMs with normal light reflex and landmarks.  Nose: Clear rhinorrhea, with some dried, crusty exudate adherent to mildly injected mucosa.  No purulent d/c.  No paranasal sinus TTP.  No facial swelling.  Throat and mouth without focal lesion.  No pharyngial swelling, erythema, or exudate.   Neck: supple, no LAD.   LUNGS: CTA bilat, nonlabored resps.   CV: RRR, no m/r/g. EXT: no c/c/e SKIN: no rash   LABS:  None  IMPRESSION AND PLAN:  URI with cough, suspect viral etiology. Hycodan suspension, 1 to 2 teaspoon 3 times a day as needed.  240 mL.  An After Visit Summary was printed and given to the patient.  FOLLOW UP: No follow-ups on file.  Signed:  Crissie Sickles, MD           10/10/2022

## 2022-12-22 ENCOUNTER — Ambulatory Visit: Payer: Medicare Other | Admitting: Family Medicine

## 2022-12-22 NOTE — Progress Notes (Deleted)
OFFICE VISIT  12/22/2022  CC: No chief complaint on file.   Patient is a 72 y.o. male who presents for 56-monthfollow-up diabetes, hypertension, and hyperlipidemia. A/P as of last visit: "#1 allergic rhinitis with cough. He will continue his antihistamine and try adding saline nasal spray over-the-counter. Tessalon has never helped in the past and he has been taking Mucinex DM and it is not helped significantly. We will do trial of Hycodan syrup, 1 to 2 teaspoon twice daily as needed, #120 mils. Therapeutic expectations and side effect profile of medication discussed today.  Patient's questions answered.   #2 diabetes without complication, good control.   POC Hba1c today is 6.8%. Continue Tresiba 15 units twice a day as well as metformin 1000 mg twice daily. Urine microalbumin/creatinine and feet exam to be done at next follow-up in 6 months.   #3 hypertension, well controlled on HCTZ 25 mg a day. Electrolytes and creatinine today.   #4 mixed hyperlipidemia. Doing well on atorvastatin 80 mg a day and fenofibrate 145 mg a day. Lipid panel and hepatic panel today.   INTERIM HX: ***   Past Medical History:  Diagnosis Date   Adenomatous colon polyp 04/2006; 2018   2007 Dr. OLajoyce Corners 2018 Dr. JStefano Gaul5/2023.   Chronic renal insufficiency, stage II (mild) 2017   CrCl 60s   Diabetes mellitus    Diverticulosis of colon    hospitalized for diverticulitis in remote past; also had episode 08/27/14   Fatty liver 2015   Noted on noncontrast CT done for eval for kidney stones   History of ETT    LOW RISK/NEG ETT 02/2011 w/Dr. TDoreatha Lew  History of hemorrhoids    Hyperlipidemia    Crestor caused leg cramps   Hypertension    Left foot pain    medial: post tibial tendonitis---podiatrist did sheath injection 03/2018 and 02/2019   Nephrolithiasis    [place] Left mid ureteral stone 01/2016--ED visit.   Prostate nodule 08/2018   Referred to urol (PSA was  normal/unchanged from prev year).   Renal cyst, left    hemorrhagic vs proteinacious (last f/u CT 2007 showed stability)--Dr. OLajoyce Corners   Shoulder pain, right     [place]. DMarlou Sa 2012    Past Surgical History:  Procedure Laterality Date   CHALAZION EXCISION  summer 2015   COLONOSCOPY W/ POLYPECTOMY  04/2006; 04/2017   2007: Adenomatous polyps x 2; recall 5 yrs (Dr. OLajoyce Corners.  Repeat 04/2017 (Dr. JPhilomena Dohenyadenoma x1 + sigmoid diverticulosis).  Recall 5 yrs.   ESOPHAGOGASTRODUODENOSCOPY  2008   Esophagitis/gastritis/GERD.  H pylori and Barrett's NEG.   HEMORRHOID SURGERY     NOSE SURGERY     deviated septum    Outpatient Medications Prior to Visit  Medication Sig Dispense Refill   atorvastatin (LIPITOR) 80 MG tablet TAKE 1 TABLET(80 MG) BY MOUTH DAILY 90 tablet 1   Continuous Blood Gluc Sensor (FREESTYLE LIBRE 3 SENSOR) MISC 1 each by Does not apply route as directed. Place 1 sensor on the skin every 14 days. Use to check glucose continuously 3 each 2   ezetimibe (ZETIA) 10 MG tablet Take 1 tablet (10 mg total) by mouth daily. 90 tablet 0   fenofibrate (TRICOR) 145 MG tablet TAKE 1 TABLET(145 MG) BY MOUTH DAILY 90 tablet 1   hydrochlorothiazide (HYDRODIURIL) 25 MG tablet Take 1 tablet (25 mg total) by mouth daily. 90 tablet 1   HYDROcodone bit-homatropine (  HYCODAN) 5-1.5 MG/5ML syrup Take 5 mLs by mouth every 8 (eight) hours as needed for cough. 240 mL 0   Insulin Pen Needle (PEN NEEDLES) 31G X 5 MM MISC 1 each by Does not apply route daily. 100 each 5   metFORMIN (GLUCOPHAGE) 1000 MG tablet TAKE 1 TABLET(1000 MG) BY MOUTH TWICE DAILY 180 tablet 1   pantoprazole (PROTONIX) 40 MG tablet Take 1 tablet (40 mg total) by mouth daily. 90 tablet 1   TRESIBA FLEXTOUCH 100 UNIT/ML FlexTouch Pen 15 units SQ bid 15 mL 3   No facility-administered medications prior to visit.    No Known Allergies  Review of Systems As per HPI  PE:    10/10/2022    8:15 AM  07/11/2022    3:35 PM 07/06/2022    2:03 PM  Vitals with BMI  Height 5' 8.5"    Weight 201 lbs 10 oz 198 lbs 6 oz 200 lbs 3 oz  BMI 79.0    Systolic 240 973 532  Diastolic 79 89 76  Pulse 67 80 71     Physical Exam  ***  LABS:  Last CBC Lab Results  Component Value Date   WBC 10.7 (H) 08/24/2021   HGB 17.0 08/24/2021   HCT 48.7 08/24/2021   MCV 88.9 08/24/2021   MCH 31.0 08/24/2021   RDW 12.6 08/24/2021   PLT 248 99/24/2683   Last metabolic panel Lab Results  Component Value Date   GLUCOSE 113 (H) 07/06/2022   NA 137 07/06/2022   K 4.4 07/06/2022   CL 95 (L) 07/06/2022   CO2 32 07/06/2022   BUN 22 07/06/2022   CREATININE 1.30 07/06/2022   GFRNONAA 53 (L) 08/24/2021   CALCIUM 9.8 07/06/2022   PROT 7.2 07/06/2022   ALBUMIN 4.5 07/06/2022   BILITOT 0.8 07/06/2022   ALKPHOS 29 (L) 07/06/2022   AST 25 10/10/2022   ALT 22 10/10/2022   ANIONGAP 10 08/24/2021   Last lipids Lab Results  Component Value Date   CHOL 203 (H) 10/10/2022   HDL 36.90 (L) 10/10/2022   LDLCALC 130 (H) 10/10/2022   LDLDIRECT 127.0 12/31/2020   TRIG 179.0 (H) 10/10/2022   CHOLHDL 5 10/10/2022   Last hemoglobin A1c Lab Results  Component Value Date   HGBA1C 6.8 (A) 07/06/2022   HGBA1C 6.8 07/06/2022   HGBA1C 6.8 (A) 07/06/2022   HGBA1C 6.8 07/06/2022   Last thyroid functions Lab Results  Component Value Date   TSH 1.22 08/28/2018   Lab Results  Component Value Date   PSA 0.66 12/30/2021   PSA 0.45 12/31/2020   PSA 0.60 09/17/2019   IMPRESSION AND PLAN:  No problem-specific Assessment & Plan notes found for this encounter.  Preventative health: Tdap->***. Hx colon polyps->due for rpt--colonoscopy. Prostate ca screening: PSA today  An After Visit Summary was printed and given to the patient.  FOLLOW UP: No follow-ups on file.  Signed:  Crissie Sickles, MD           12/22/2022

## 2022-12-26 ENCOUNTER — Other Ambulatory Visit: Payer: Self-pay | Admitting: Family Medicine

## 2022-12-28 ENCOUNTER — Encounter: Payer: Self-pay | Admitting: Family Medicine

## 2022-12-28 ENCOUNTER — Ambulatory Visit (INDEPENDENT_AMBULATORY_CARE_PROVIDER_SITE_OTHER): Payer: PPO | Admitting: Family Medicine

## 2022-12-28 VITALS — BP 146/79 | HR 69 | Temp 98.9°F | Ht 68.5 in | Wt 206.2 lb

## 2022-12-28 DIAGNOSIS — E782 Mixed hyperlipidemia: Secondary | ICD-10-CM

## 2022-12-28 DIAGNOSIS — Z125 Encounter for screening for malignant neoplasm of prostate: Secondary | ICD-10-CM | POA: Diagnosis not present

## 2022-12-28 DIAGNOSIS — I10 Essential (primary) hypertension: Secondary | ICD-10-CM

## 2022-12-28 DIAGNOSIS — E119 Type 2 diabetes mellitus without complications: Secondary | ICD-10-CM

## 2022-12-28 LAB — LIPID PANEL
Cholesterol: 202 mg/dL — ABNORMAL HIGH (ref 0–200)
HDL: 39 mg/dL — ABNORMAL LOW (ref >39.00)
LDL Cholesterol: 124 mg/dL — ABNORMAL HIGH (ref 0–99)
NonHDL: 163.12
Total CHOL/HDL Ratio: 5
Triglycerides: 196 mg/dL — ABNORMAL HIGH (ref 0.0–149.0)
VLDL: 39.2 mg/dL (ref 0.0–40.0)

## 2022-12-28 LAB — COMPREHENSIVE METABOLIC PANEL
ALT: 29 U/L (ref 0–53)
AST: 31 U/L (ref 0–37)
Albumin: 4.5 g/dL (ref 3.5–5.2)
Alkaline Phosphatase: 26 U/L — ABNORMAL LOW (ref 39–117)
BUN: 21 mg/dL (ref 6–23)
CO2: 31 mEq/L (ref 19–32)
Calcium: 9.5 mg/dL (ref 8.4–10.5)
Chloride: 97 mEq/L (ref 96–112)
Creatinine, Ser: 1.28 mg/dL (ref 0.40–1.50)
GFR: 56.33 mL/min — ABNORMAL LOW (ref >60.00)
Glucose, Bld: 91 mg/dL (ref 70–99)
Potassium: 4.1 mEq/L (ref 3.5–5.1)
Sodium: 138 mEq/L (ref 135–145)
Total Bilirubin: 0.9 mg/dL (ref 0.2–1.2)
Total Protein: 7.1 g/dL (ref 6.0–8.3)

## 2022-12-28 LAB — POCT GLYCOSYLATED HEMOGLOBIN (HGB A1C)
HbA1c POC (<> result, manual entry): 7.6 % (ref 4.0–5.6)
HbA1c, POC (controlled diabetic range): 7.6 % — AB (ref 0.0–7.0)
HbA1c, POC (prediabetic range): 7.6 % — AB (ref 5.7–6.4)
Hemoglobin A1C: 7.6 % — AB (ref 4.0–5.6)

## 2022-12-28 LAB — MICROALBUMIN / CREATININE URINE RATIO
Creatinine,U: 62.1 mg/dL
Microalb Creat Ratio: 1.1 mg/g (ref 0.0–30.0)
Microalb, Ur: <0.7 mg/dL (ref 0.0–1.9)

## 2022-12-28 LAB — PSA, MEDICARE: PSA: 0.54 ng/ml (ref 0.10–4.00)

## 2022-12-28 MED ORDER — METFORMIN HCL 1000 MG PO TABS: ORAL_TABLET | ORAL | 1 refills | Status: DC

## 2022-12-28 MED ORDER — HYDROCHLOROTHIAZIDE 25 MG PO TABS: ORAL_TABLET | ORAL | 1 refills | Status: DC

## 2022-12-28 MED ORDER — ATORVASTATIN CALCIUM 80 MG PO TABS: ORAL_TABLET | ORAL | 1 refills | Status: DC

## 2022-12-28 MED ORDER — TETANUS-DIPHTH-ACELL PERTUSSIS 5-2.5-18.5 LF-MCG/0.5 IM SUSP: 0.5000 mL | Freq: Once | INTRAMUSCULAR | 0 refills | Status: AC

## 2022-12-28 MED ORDER — EZETIMIBE 10 MG PO TABS: 10.0000 mg | ORAL_TABLET | Freq: Every day | ORAL | 1 refills | Status: DC

## 2022-12-28 MED ORDER — FENOFIBRATE 145 MG PO TABS: ORAL_TABLET | ORAL | 1 refills | Status: DC

## 2022-12-28 MED ORDER — PEN NEEDLES 31G X 5 MM MISC: 1.0000 | Freq: Every day | 5 refills | Status: DC

## 2022-12-28 MED ORDER — SHINGRIX 50 MCG/0.5ML IM SUSR: 0.5000 mL | Freq: Once | INTRAMUSCULAR | 0 refills | Status: AC

## 2022-12-28 MED ORDER — PANTOPRAZOLE SODIUM 40 MG PO TBEC: DELAYED_RELEASE_TABLET | ORAL | 1 refills | Status: DC

## 2022-12-28 NOTE — Addendum Note (Signed)
Addended by: Deveron Furlong D on: 12/28/2022 08:54 AM   Modules accepted: Orders

## 2022-12-28 NOTE — Progress Notes (Signed)
OFFICE VISIT  12/28/2022  CC:  Chief Complaint  Patient presents with   Medical Management of Chronic Issues    Pt is fasting    Patient is a 72 y.o. male who presents for follow-up diabetes, hypertension, and hyperlipidemia. A/P as of last visit: "1diabetes without complication, good control.   POC Hba1c today is 6.8%. Continue Tresiba 15 units twice a day as well as metformin 1000 mg twice daily. Urine microalbumin/creatinine and feet exam to be done at next follow-up in 6 months.   #2 hypertension, well controlled on HCTZ 25 mg a day. Electrolytes and creatinine today.   #3 mixed hyperlipidemia. Doing well on atorvastatin 80 mg a day and fenofibrate 145 mg a day. Lipid panel and hepatic panel today.   #4 Preventative health: Shingrix-> he got #1 but needs #2. Hx colon polyps->due for rpt--> I reminded him of this today and gave him his gastroenterologist's office phone number.. Prostate ca screening: next PSA 29mo"  INTERIM HX: Shaun Knight feels very well. He remains active but no formal exercise. Diet is good for the most part. Some days when his sugar is 70-100 in the morning he will just skip his insulin dose.  Remains compliant with metformin.  No home blood pressure monitoring.   ROS -> no fevers, no CP, no SOB, no wheezing, no cough, no dizziness, no HAs, no rashes, no melena/hematochezia.  No polyuria or polydipsia.  No myalgias or arthralgias.  No focal weakness, paresthesias, or tremors.  No acute vision or hearing abnormalities.  No dysuria or unusual/new urinary urgency or frequency.  No recent changes in lower legs. No n/v/d or abd pain.  No palpitations.    Past Medical History:  Diagnosis Date   Adenomatous colon polyp 04/2006; 2018   2007 Dr. OLajoyce Corners 2018 Dr. JStefano Gaul5/2023.   Chronic renal insufficiency, stage II (mild) 2017   CrCl 60s   Diabetes mellitus    Diverticulosis of colon    hospitalized for diverticulitis in remote past; also had episode  08/27/14   Fatty liver 2015   Noted on noncontrast CT done for eval for kidney stones   History of ETT    LOW RISK/NEG ETT 02/2011 w/Dr. TDoreatha Lew  History of hemorrhoids    Hyperlipidemia    Crestor caused leg cramps   Hypertension    Left foot pain    medial: post tibial tendonitis---podiatrist did sheath injection 03/2018 and 02/2019   Nephrolithiasis    [place] Left mid ureteral stone 01/2016--ED visit.   Prostate nodule 08/2018   Referred to urol (PSA was normal/unchanged from prev year).   Renal cyst, left    hemorrhagic vs proteinacious (last f/u CT 2007 showed stability)--Dr. OLajoyce Corners   Shoulder pain, right     [place]. [place] 2012    Past Surgical History:  Procedure Laterality Date   CHALAZION EXCISION  summer 2015   COLONOSCOPY W/ POLYPECTOMY  04/2006; 04/2017   2007: Adenomatous polyps x 2; recall 5 yrs (Dr. OLajoyce Corners.  Repeat 04/2017 (Dr. JPhilomena Dohenyadenoma x1 + sigmoid diverticulosis).  Recall 5 yrs.   ESOPHAGOGASTRODUODENOSCOPY  2008   Esophagitis/gastritis/GERD.  H pylori and Barrett's NEG.   HEMORRHOID SURGERY     NOSE SURGERY     deviated septum    Outpatient Medications Prior to Visit  Medication Sig Dispense Refill   Continuous Blood Gluc Sensor (FREESTYLE LIBRE 3 SENSOR) MISC 1 each by Does not apply  route as directed. Place 1 sensor on the skin every 14 days. Use to check glucose continuously 3 each 2   TRESIBA FLEXTOUCH 100 UNIT/ML FlexTouch Pen 15 units SQ bid 15 mL 3   HYDROcodone bit-homatropine (HYCODAN) 5-1.5 MG/5ML syrup Take 5 mLs by mouth every 8 (eight) hours as needed for cough. 240 mL 0   atorvastatin (LIPITOR) 80 MG tablet TAKE 1 TABLET(80 MG) BY MOUTH DAILY 30 tablet 0   ezetimibe (ZETIA) 10 MG tablet Take 1 tablet (10 mg total) by mouth daily. 90 tablet 0   fenofibrate (TRICOR) 145 MG tablet TAKE 1 TABLET(145 MG) BY MOUTH DAILY 90 tablet 1   hydrochlorothiazide (HYDRODIURIL) 25 MG  tablet TAKE 1 TABLET(25 MG) BY MOUTH DAILY 30 tablet 0   Insulin Pen Needle (PEN NEEDLES) 31G X 5 MM MISC 1 each by Does not apply route daily. 100 each 5   metFORMIN (GLUCOPHAGE) 1000 MG tablet TAKE 1 TABLET(1000 MG) BY MOUTH TWICE DAILY 60 tablet 0   pantoprazole (PROTONIX) 40 MG tablet TAKE 1 TABLET(40 MG) BY MOUTH DAILY 30 tablet 0   No facility-administered medications prior to visit.    No Known Allergies  Review of Systems As per HPI  PE:    12/28/2022    8:16 AM 10/10/2022    8:15 AM 07/11/2022    3:35 PM  Vitals with BMI  Height 5' 8.5" 5' 8.5"   Weight 206 lbs 3 oz 201 lbs 10 oz 198 lbs 6 oz  BMI 24.40 10.2   Systolic 725 366 440  Diastolic 79 79 89  Pulse 69 67 80     Physical Exam  Gen: Alert, well appearing.  Patient is oriented to person, place, time, and situation. AFFECT: pleasant, lucid thought and speech. ENT: Ears: EACs clear, normal epithelium.  TMs with good light reflex and landmarks bilaterally.  Eyes: no injection, icteris, swelling, or exudate.  EOMI, PERRLA. Nose: no drainage or turbinate edema/swelling.  No injection or focal lesion.  Mouth: lips without lesion/swelling.  Oral mucosa pink and moist.  Dentition intact and without obvious caries or gingival swelling.  Oropharynx without erythema, exudate, or swelling.  Neck: supple/nontender.  No LAD, mass, or TM.  Carotid pulses 2+ bilaterally, without bruits. CV: RRR, no m/r/g.   LUNGS: CTA bilat, nonlabored resps, good aeration in all lung fields. ABD: soft, NT, ND, BS normal.  No hepatospenomegaly or mass.  No bruits. EXT: no clubbing, cyanosis, or edema.  Musculoskeletal: no joint swelling, erythema, warmth, or tenderness.  ROM of all joints intact. Skin - no sores or suspicious lesions or rashes or color changes   LABS:  Last CBC Lab Results  Component Value Date   WBC 10.7 (H) 08/24/2021   HGB 17.0 08/24/2021   HCT 48.7 08/24/2021   MCV 88.9 08/24/2021   MCH 31.0 08/24/2021   RDW 12.6  08/24/2021   PLT 248 34/74/2595   Last metabolic panel Lab Results  Component Value Date   GLUCOSE 113 (H) 07/06/2022   NA 137 07/06/2022   K 4.4 07/06/2022   CL 95 (L) 07/06/2022   CO2 32 07/06/2022   BUN 22 07/06/2022   CREATININE 1.30 07/06/2022   GFRNONAA 53 (L) 08/24/2021   CALCIUM 9.8 07/06/2022   PROT 7.2 07/06/2022   ALBUMIN 4.5 07/06/2022   BILITOT 0.8 07/06/2022   ALKPHOS 29 (L) 07/06/2022   AST 25 10/10/2022   ALT 22 10/10/2022   ANIONGAP 10 08/24/2021   Last lipids Lab Results  Component Value Date   CHOL 203 (H) 10/10/2022   HDL 36.90 (L) 10/10/2022   LDLCALC 130 (H) 10/10/2022   LDLDIRECT 127.0 12/31/2020   TRIG 179.0 (H) 10/10/2022   CHOLHDL 5 10/10/2022   Last hemoglobin A1c Lab Results  Component Value Date   HGBA1C 7.6 (A) 12/28/2022   HGBA1C 7.6 12/28/2022   HGBA1C 7.6 (A) 12/28/2022   HGBA1C 7.6 (A) 12/28/2022   Last thyroid functions Lab Results  Component Value Date   TSH 1.22 08/28/2018   Lab Results  Component Value Date   PSA 0.66 12/30/2021   PSA 0.45 12/31/2020   PSA 0.60 09/17/2019   IMPRESSION AND PLAN:  #1 diabetes without complication. Fairly well-controlled. POC Hba1c 7.6% today. Continue metformin 1000 twice daily and Tresiba 15 units twice a day.  #2 hypertension, well-controlled on hydrochlorothiazide 25 mg a day. Electrolytes and creatinine today.  3.  Hyperlipidemia, mixed. Doing well on fenofibrate 145 mg a day and a atorvastatin 80 mg a day. Lipid panel and hepatic panel today.  #4 preventative health: Shingrix-> Shingrix->rx to pharmacy. Tdap->rx to pharmacy.  Flu->declined.  O/w all UTD. Hx colon polyps->due for rpt as of 04/2022--> I reminded him of this today and gave him his gastroenterologist's office phone number.. Prostate ca screening: PSA today  An After Visit Summary was printed and given to the patient.  FOLLOW UP: Return in about 6 months (around 06/28/2023) for routine chronic illness  f/u.  Signed:  Crissie Sickles, MD           12/28/2022

## 2022-12-28 NOTE — Patient Instructions (Addendum)
GI MD phone: [phone]  Health Maintenance, Male Adopting a healthy lifestyle and getting preventive care are important in promoting health and wellness. Ask your health care provider about: The right schedule for you to have regular tests and exams. Things you can do on your own to prevent diseases and keep yourself healthy. What should I know about diet, weight, and exercise? Eat a healthy diet  Eat a diet that includes plenty of vegetables, fruits, low-fat dairy products, and lean protein. Do not eat a lot of foods that are high in solid fats, added sugars, or sodium. Maintain a healthy weight Body mass index (BMI) is a measurement that can be used to identify possible weight problems. It estimates body fat based on height and weight. Your health care provider can help determine your BMI and help you achieve or maintain a healthy weight. Get regular exercise Get regular exercise. This is one of the most important things you can do for your health. Most adults should: Exercise for at least 150 minutes each week. The exercise should increase your heart rate and make you sweat (moderate-intensity exercise). Do strengthening exercises at least twice a week. This is in addition to the moderate-intensity exercise. Spend less time sitting. Even light physical activity can be beneficial. Watch cholesterol and blood lipids Have your blood tested for lipids and cholesterol at 72 years of age, then have this test every 5 years. You may need to have your cholesterol levels checked more often if: Your lipid or cholesterol levels are high. You are older than 72 years of age. You are at high risk for heart disease. What should I know about cancer screening? Many types of cancers can be detected early and may often be prevented. Depending on your health history and family history, you may need to have cancer screening at various ages. This may include screening for: Colorectal cancer. Prostate  cancer. Skin cancer. Lung cancer. What should I know about heart disease, diabetes, and high blood pressure? Blood pressure and heart disease High blood pressure causes heart disease and increases the risk of stroke. This is more likely to develop in people who have high blood pressure readings or are overweight. Talk with your health care provider about your target blood pressure readings. Have your blood pressure checked: Every 3-5 years if you are 47-54 years of age. Every year if you are 64 years old or older. If you are between the ages of 39 and 46 and are a current or former smoker, ask your health care provider if you should have a one-time screening for abdominal aortic aneurysm (AAA). Diabetes Have regular diabetes screenings. This checks your fasting blood sugar level. Have the screening done: Once every three years after age 35 if you are at a normal weight and have a low risk for diabetes. More often and at a younger age if you are overweight or have a high risk for diabetes. What should I know about preventing infection? Hepatitis B If you have a higher risk for hepatitis B, you should be screened for this virus. Talk with your health care provider to find out if you are at risk for hepatitis B infection. Hepatitis C Blood testing is recommended for: Everyone born from [birth year] through 1965. Anyone with known risk factors for hepatitis C. Sexually transmitted infections (STIs) You should be screened each year for STIs, including gonorrhea and chlamydia, if: You are sexually active and are younger than 72 years of age. You are older than 72  years of age and your health care provider tells you that you are at risk for this type of infection. Your sexual activity has changed since you were last screened, and you are at increased risk for chlamydia or gonorrhea. Ask your health care provider if you are at risk. Ask your health care provider about whether you are at high risk for HIV.  Your health care provider may recommend a prescription medicine to help prevent HIV infection. If you choose to take medicine to prevent HIV, you should first get tested for HIV. You should then be tested every 3 months for as long as you are taking the medicine. Follow these instructions at home: Alcohol use Do not drink alcohol if your health care provider tells you not to drink. If you drink alcohol: Limit how much you have to 0-2 drinks a day. Know how much alcohol is in your drink. In the [place] one drink equals one 12 oz bottle of beer (355 mL), one 5 oz glass of wine (148 mL), or one 1 oz glass of hard liquor (44 mL). Lifestyle Do not use any products that contain nicotine or tobacco. These products include cigarettes, chewing tobacco, and vaping devices, such as e-cigarettes. If you need help quitting, ask your health care provider. Do not use street drugs. Do not share needles. Ask your health care provider for help if you need support or information about quitting drugs. General instructions Schedule regular health, dental, and eye exams. Stay current with your vaccines. Tell your health care provider if: You often feel depressed. You have ever been abused or do not feel safe at home. Summary Adopting a healthy lifestyle and getting preventive care are important in promoting health and wellness. Follow your health care provider's instructions about healthy diet, exercising, and getting tested or screened for diseases. Follow your health care provider's instructions on monitoring your cholesterol and blood pressure. This information is not intended to replace advice given to you by your health care provider. Make sure you discuss any questions you have with your health care provider. Document Revised: 04/26/2021 Document Reviewed: 04/26/2021 Elsevier Patient Education  [employer].

## 2022-12-29 ENCOUNTER — Ambulatory Visit (INDEPENDENT_AMBULATORY_CARE_PROVIDER_SITE_OTHER): Payer: PPO | Admitting: Orthopaedic Surgery

## 2022-12-29 ENCOUNTER — Encounter: Payer: Self-pay | Admitting: Orthopaedic Surgery

## 2022-12-29 ENCOUNTER — Ambulatory Visit (INDEPENDENT_AMBULATORY_CARE_PROVIDER_SITE_OTHER): Payer: PPO

## 2022-12-29 DIAGNOSIS — M7541 Impingement syndrome of right shoulder: Secondary | ICD-10-CM | POA: Diagnosis not present

## 2022-12-29 DIAGNOSIS — G8929 Other chronic pain: Secondary | ICD-10-CM

## 2022-12-29 DIAGNOSIS — M25511 Pain in right shoulder: Secondary | ICD-10-CM

## 2022-12-29 MED ORDER — LIDOCAINE HCL 1 % IJ SOLN
3.0000 mL | INTRAMUSCULAR | Status: AC | PRN
  Administered 2022-12-29: 3 mL

## 2022-12-29 MED ORDER — METHYLPREDNISOLONE ACETATE 40 MG/ML IJ SUSP
40.0000 mg | INTRAMUSCULAR | Status: AC | PRN
  Administered 2022-12-29: 40 mg via INTRA_ARTICULAR

## 2022-12-29 NOTE — Progress Notes (Signed)
Patient is a very pleasant and active 72 year old gentleman who comes in with right shoulder pain has been slowly getting worse for some time now.  He is requesting steroid injection in that shoulder.  He denies any weakness.  He does have pain that wakes him up at night but he does also sleep with his arms above his head.  He has had no injury to his shoulders either.  On exam his right shoulder moves smoothly and fluidly.  He does have positive Neer and Hawkins signs his pain seems to be anterior and some of the [facility] joint.  3 views of the right shoulder send no acute findings.  The joint space is well-maintained.  I did recommend at least a subacromial steroid injection today to see if this will help.  He tolerated that injection well and agreed with this treatment plan.  He will let us know if the pain does not improve enough because my next step would be sending him to Dr. Rolena Infante for an ultrasound-guided [facility] joint injection.  All question concerns were answered and addressed.       Procedure Note  Patient: Shaun Knight             Date of Birth: Apr 19, 1951           MRN: [id number]             Visit Date: 12/29/2022  Procedures: Visit Diagnoses:  1. Chronic right shoulder pain   2. Impingement syndrome of right shoulder     Large Joint Inj: R subacromial bursa on 12/29/2022 2:07 PM Indications: pain and diagnostic evaluation Details: 22 G 1.5 in needle  Arthrogram: No  Medications: 3 mL lidocaine 1 %; 40 mg methylPREDNISolone acetate 40 MG/ML Outcome: tolerated well, no immediate complications Procedure, treatment alternatives, risks and benefits explained, specific risks discussed. Consent was given by the patient. Immediately prior to procedure a time out was called to verify the correct patient, procedure, equipment, support staff and site/side marked as required. Patient was prepped and draped in the usual sterile fashion.

## 2023-02-01 ENCOUNTER — Encounter: Payer: Self-pay | Admitting: Family Medicine

## 2023-02-01 ENCOUNTER — Ambulatory Visit (INDEPENDENT_AMBULATORY_CARE_PROVIDER_SITE_OTHER): Payer: PPO | Admitting: Sports Medicine

## 2023-02-01 ENCOUNTER — Ambulatory Visit: Payer: Self-pay

## 2023-02-01 ENCOUNTER — Ambulatory Visit (INDEPENDENT_AMBULATORY_CARE_PROVIDER_SITE_OTHER): Payer: PPO | Admitting: Orthopaedic Surgery

## 2023-02-01 DIAGNOSIS — M25512 Pain in left shoulder: Secondary | ICD-10-CM

## 2023-02-01 DIAGNOSIS — G8929 Other chronic pain: Secondary | ICD-10-CM

## 2023-02-01 DIAGNOSIS — M25511 Pain in right shoulder: Secondary | ICD-10-CM | POA: Diagnosis not present

## 2023-02-01 DIAGNOSIS — M7541 Impingement syndrome of right shoulder: Secondary | ICD-10-CM | POA: Diagnosis not present

## 2023-02-01 MED ORDER — BUPIVACAINE HCL 0.25 % IJ SOLN
2.0000 mL | INTRAMUSCULAR | Status: AC | PRN
  Administered 2023-02-01: 2 mL via INTRA_ARTICULAR

## 2023-02-01 MED ORDER — LIDOCAINE HCL 1 % IJ SOLN
2.0000 mL | INTRAMUSCULAR | Status: AC | PRN
  Administered 2023-02-01: 2 mL

## 2023-02-01 MED ORDER — METHYLPREDNISOLONE ACETATE 40 MG/ML IJ SUSP
40.0000 mg | INTRAMUSCULAR | Status: AC | PRN
  Administered 2023-02-01: 40 mg via INTRA_ARTICULAR

## 2023-02-01 NOTE — Progress Notes (Signed)
   Procedure Note  Patient: Shaun Knight             Date of Birth: 01/28/1951           MRN: [id number]             Visit Date: 02/01/2023  Procedures: Visit Diagnoses:  1. Chronic right shoulder pain   2. Chronic left shoulder pain    Large Joint Inj: R glenohumeral on 02/01/2023 10:13 AM Indications: pain Details: 22 G 3.5 in needle, ultrasound-guided posterior approach Medications: 2 mL lidocaine 1 %; 2 mL bupivacaine 0.25 %; 40 mg methylPREDNISolone acetate 40 MG/ML Outcome: tolerated well, no immediate complications  US-guided glenohumeral joint injection, right shoulder After discussion on risks/benefits/indications, informed verbal consent was obtained. A timeout was then performed. The patient was positioned lying lateral recumbent on examination table. The patient's shoulder was prepped with betadine and multiple alcohol swabs and utilizing ultrasound guidance, the patient's glenohumeral joint was identified on ultrasound. Using ultrasound guidance a 22-gauge, 3.5 inch needle with a mixture of 2:2:2 cc's lidocaine:bupivicaine:depomedrol was directed from a lateral to medial direction via in-plane technique into the glenohumeral joint with visualization of appropriate spread of injectate into the joint. Patient tolerated the procedure well without immediate complications.      Procedure, treatment alternatives, risks and benefits explained, specific risks discussed. Consent was given by the patient. Immediately prior to procedure a time out was called to verify the correct patient, procedure, equipment, support staff and site/side marked as required. Patient was prepped and draped in the usual sterile fashion.     - I evaluated the patient about 10 minutes post-injection and they had improvement in pain and range of motion - may represent in > 10 days for contralateral shoulder injection if desires - follow-up with Dr. Ninfa Linden and Artis Delay as indicated; I am happy to see them as  needed  Elba Barman, DO [place]  This note was dictated using Dragon naturally speaking software and may contain errors in syntax, spelling, or content which have not been identified prior to signing this note.

## 2023-02-01 NOTE — Progress Notes (Signed)
HPI: Shaun Knight returns today follow-up of his right shoulder pain.  He states that the injection he was given on 12/29/2022 getting no real relief.  He points to the anterior aspect of his right shoulder.  He also notes that he is now having left shoulder pain in the same area that he is reporting to the bicipital groove bilaterally.  He did try some cupping of both shoulders and that this did help some but was temporary relief.  He reports no adverse effects to the injection.  He is diabetic and his last hemoglobin A1c was 7.6.  He has had no injury to either shoulder.  He is asking for injection of both shoulders today.   Review of systems see HPI.  Negative for fevers chills.  Physical exam: General well-developed well-nourished pleasant male in no acute distress. Psych: Alert and oriented x 3 Bilateral shoulders 5 out of 5 strength with external and internal rotation against resistance empty can test is negative bilaterally.  Positive impingement bilaterally.  Fluid range of motion bilateral shoulders without any grinding.  Liftoff test is negative bilaterally.  Bicep strength is 5 out of 5.  There is no obvious biceps tendon rupture proximally.  Areas of ecchymosis from cupping noted about the shoulder girdle anterior bilateral shoulders.  Impression: Right shoulder pain chronic Left shoulder pain acute  Plan: Will refer him to Dr. Rolena Infante for an intra-articular injection of the right shoulder under ultrasound.  Regards to the left shoulder intra articular he can have this performed in 10 days to 2 weeks.  He will follow-up with [place] as needed.  Questions were encouraged and answered at length today.

## 2023-02-03 ENCOUNTER — Other Ambulatory Visit: Payer: Self-pay | Admitting: Family Medicine

## 2023-02-13 ENCOUNTER — Other Ambulatory Visit: Payer: Self-pay | Admitting: Family Medicine

## 2023-02-14 ENCOUNTER — Telehealth: Payer: Self-pay | Admitting: Family Medicine

## 2023-02-14 MED ORDER — FREESTYLE LIBRE 14 DAY SENSOR MISC: 2 refills | Status: DC

## 2023-02-14 NOTE — Telephone Encounter (Signed)
Contacted pt's pharmacy to confirmed if insurance will cover 3 month supply of freestyle monitors. Spoke with pharmacist, no refill received from 2/19, rx resent and he will try to bill for 3 month supply. Pt advised refill sent and pharmacy will inform him if insurance approves 3 month supply.

## 2023-02-14 NOTE — Telephone Encounter (Signed)
Patient would like to have a 3 month monitor. He would like to limit his trips to the pharmacy ever month if possible.

## 2023-02-16 ENCOUNTER — Other Ambulatory Visit: Payer: Self-pay | Admitting: Family Medicine

## 2023-02-16 NOTE — Telephone Encounter (Signed)
Patient states that incorrect prescription was called into pharmacy.  FreeStyle Libre 14-day censor system.  Patient states he made 2 trips and they have wrong items.  Please call [phone].  Patient states he needs tomorrow please.

## 2023-02-16 NOTE — Telephone Encounter (Signed)
Pt called and confirmed rx issue fixed. Nothing further needed

## 2023-02-22 ENCOUNTER — Encounter: Payer: Self-pay | Admitting: Orthopaedic Surgery

## 2023-02-22 ENCOUNTER — Ambulatory Visit (INDEPENDENT_AMBULATORY_CARE_PROVIDER_SITE_OTHER): Payer: PPO | Admitting: Orthopaedic Surgery

## 2023-02-22 DIAGNOSIS — G8929 Other chronic pain: Secondary | ICD-10-CM

## 2023-02-22 DIAGNOSIS — M25512 Pain in left shoulder: Secondary | ICD-10-CM | POA: Diagnosis not present

## 2023-02-22 MED ORDER — LIDOCAINE HCL 1 % IJ SOLN
3.0000 mL | INTRAMUSCULAR | Status: AC | PRN
  Administered 2023-02-22: 3 mL

## 2023-02-22 MED ORDER — METHYLPREDNISOLONE ACETATE 40 MG/ML IJ SUSP
40.0000 mg | INTRAMUSCULAR | Status: AC | PRN
  Administered 2023-02-22: 40 mg via INTRA_ARTICULAR

## 2023-02-22 NOTE — Progress Notes (Signed)
The patient is a 72 year old who have seen earlier this year for chronic right shoulder pain.  A subacromial steroid injection did not help significantly so we eventually sent him to Dr. Rolena Infante who did provide an ultrasound-guided intra-articular injection of the right glenohumeral joint.  He said that injection was helpful and he is asymptomatic with the right shoulder.  He is a diabetic.  He has been having chronic left shoulder pain and would like to have at least a similar injection today.  He understands that we can only provide one in the subacromial outlet that one under ultrasound would need to be set up again.  He says is not really hurting today but it does wake him up at night and he would like to still try a subacromial injection.  He denies any adverse effects to his blood glucose with the steroid injections.  Examination of his right shoulder shows that moves smoothly and fluidly with no issues at all.  There is no pain.  His left shoulder likewise moves smoothly and fluidly and there is some deep pain around the subacromial outlet and the glenohumeral joint.  There were no x-rays today.   I did provide a steroid injection in his left shoulder subacromial aloe which she tolerated well.  I let him know that if this does not work that he should contact my partner Dr. Rolena Infante for scheduling an intra-articular steroid injection in the left shoulder joint under ultrasound.  He agrees with this treatment plan.  Follow-up for me is as needed.      Procedure Note  Patient: Shaun Knight             Date of Birth: 03-12-1951           MRN: [id number]             Visit Date: 02/22/2023  Procedures: Visit Diagnoses:  1. Chronic left shoulder pain     Large Joint Inj: L subacromial bursa on 02/22/2023 9:47 AM Indications: pain and diagnostic evaluation Details: 22 G 1.5 in needle  Arthrogram: No  Medications: 3 mL lidocaine 1 %; 40 mg methylPREDNISolone acetate 40 MG/ML Outcome: tolerated  well, no immediate complications Procedure, treatment alternatives, risks and benefits explained, specific risks discussed. Consent was given by the patient. Immediately prior to procedure a time out was called to verify the correct patient, procedure, equipment, support staff and site/side marked as required. Patient was prepped and draped in the usual sterile fashion.

## 2023-03-02 DIAGNOSIS — E785 Hyperlipidemia, unspecified: Secondary | ICD-10-CM | POA: Diagnosis not present

## 2023-03-02 DIAGNOSIS — I10 Essential (primary) hypertension: Secondary | ICD-10-CM | POA: Diagnosis not present

## 2023-03-02 DIAGNOSIS — E663 Overweight: Secondary | ICD-10-CM | POA: Diagnosis not present

## 2023-03-02 DIAGNOSIS — E1165 Type 2 diabetes mellitus with hyperglycemia: Secondary | ICD-10-CM | POA: Diagnosis not present

## 2023-03-02 DIAGNOSIS — E669 Obesity, unspecified: Secondary | ICD-10-CM | POA: Diagnosis not present

## 2023-03-02 DIAGNOSIS — Z794 Long term (current) use of insulin: Secondary | ICD-10-CM | POA: Diagnosis not present

## 2023-03-02 DIAGNOSIS — K219 Gastro-esophageal reflux disease without esophagitis: Secondary | ICD-10-CM | POA: Diagnosis not present

## 2023-03-02 DIAGNOSIS — E1169 Type 2 diabetes mellitus with other specified complication: Secondary | ICD-10-CM | POA: Diagnosis not present

## 2023-03-02 LAB — HM AWV

## 2023-03-05 ENCOUNTER — Other Ambulatory Visit: Payer: Self-pay | Admitting: Family Medicine

## 2023-03-28 ENCOUNTER — Encounter (HOSPITAL_BASED_OUTPATIENT_CLINIC_OR_DEPARTMENT_OTHER): Payer: Self-pay | Admitting: Internal Medicine

## 2023-03-28 ENCOUNTER — Ambulatory Visit (INDEPENDENT_AMBULATORY_CARE_PROVIDER_SITE_OTHER): Payer: PPO | Admitting: Internal Medicine

## 2023-03-28 VITALS — BP 152/88 | HR 71 | Ht 67.0 in | Wt 206.0 lb

## 2023-03-28 DIAGNOSIS — I700 Atherosclerosis of aorta: Secondary | ICD-10-CM

## 2023-03-28 DIAGNOSIS — K760 Fatty (change of) liver, not elsewhere classified: Secondary | ICD-10-CM

## 2023-03-28 DIAGNOSIS — E782 Mixed hyperlipidemia: Secondary | ICD-10-CM | POA: Diagnosis not present

## 2023-03-28 DIAGNOSIS — E119 Type 2 diabetes mellitus without complications: Secondary | ICD-10-CM

## 2023-03-28 DIAGNOSIS — E785 Hyperlipidemia, unspecified: Secondary | ICD-10-CM | POA: Diagnosis not present

## 2023-03-28 NOTE — Patient Instructions (Signed)
Medication Instructions:  NO CHANGES  *If you need a refill on your cardiac medications before your next appointment, please call your pharmacy*   Lab Work: NMR lipoprofile, LPa today   If you have labs (blood work) drawn today and your tests are completely normal, you will receive your results only by: MyChart Message (if you have MyChart) OR A paper copy in the mail If you have any lab test that is abnormal or we need to change your treatment, we will call you to review the results.    Follow-Up: At [facility], you and your health needs are our priority.  As part of our continuing mission to provide you with exceptional heart care, we have created designated Provider Care Teams.  These Care Teams include your primary Cardiologist (physician) and Advanced Practice Providers (APPs -  Physician Assistants and Nurse Practitioners) who all work together to provide you with the care you need, when you need it.  We recommend signing up for the patient portal called "MyChart".  Sign up information is provided on this After Visit Summary.  MyChart is used to connect with patients for Virtual Visits (Telemedicine).  Patients are able to view lab/test results, encounter notes, upcoming appointments, etc.  Non-urgent messages can be sent to your provider as well.   To learn more about what you can do with MyChart, go to [email or web address].    Your next appointment:    4 months with Dr. Rennis Golden

## 2023-03-28 NOTE — Progress Notes (Signed)
LIPID CLINIC CONSULT NOTE  Chief Complaint:  Manage dyslipidemia  Primary Care Physician: Jeoffrey Massed, MD  Primary Cardiologist:  None  HPI:  Shaun Knight is a 72 y.o. male who is being seen today for the evaluation of this lipidemia at the request of McGowen, Maryjean Morn, MD. this a pleasant 72 year old male kindly referred for evaluation management of dyslipidemia.  He has a history of type 2 diabetes with recent A1c 7.1%.  He also has dyslipidemia and family history of heart disease in both parents that both had open heart surgery.  In the past he has had some abdominal imaging which showed fatty liver disease and aortic atherosclerosis.  In January he had repeat labs by his PCP which showed persistently elevated cholesterol.  He said that medication was added at that time which may have been ezetimibe although according to Dr. Samul Dada note, he was advised to continue his current medications.  This also includes fenofibrate and high-dose atorvastatin 80 mg daily.  AST and ALT have been normal.  His target LDL is less than 70.  He had previously lost about 50 pounds and is maintaining a weight around 200 pounds.  PMHx:  Past Medical History:  Diagnosis Date   Adenomatous colon polyp 04/2006; 2018   2007 Dr. Virginia Rochester; 2018 Dr. Misty Stanley 04/2022.   Chronic renal insufficiency, stage II (mild) 2017   CrCl 60s   Diabetes mellitus    Diverticulosis of colon    hospitalized for diverticulitis in remote past; also had episode 08/27/14   Fatty liver 2015   Noted on noncontrast CT done for eval for kidney stones   History of ETT    LOW RISK/NEG ETT 02/2011 w/Dr. Deborah Chalk   History of hemorrhoids    Hyperlipidemia    Crestor caused leg cramps   Hypertension    Left foot pain    medial: post tibial tendonitis---podiatrist did sheath injection 03/2018 and 02/2019   Nephrolithiasis    Saw urologist in DISTANT past ([facility]). Left mid ureteral stone 01/2016--ED visit.   Prostate nodule  08/2018   Referred to urol (PSA was normal/unchanged from prev year).   Renal cyst, left    hemorrhagic vs proteinacious (last f/u CT 2007 showed stability)--Dr. Virginia Rochester.   Shoulder pain, right     Injection on 2 consec mo at [facility] (Dr. August Saucer) 2012.  [facility] jt inj 12/2022 ortho    Past Surgical History:  Procedure Laterality Date   CHALAZION EXCISION  summer 2015   COLONOSCOPY W/ POLYPECTOMY  04/2006; 04/2017   2007: Adenomatous polyps x 2; recall 5 yrs (Dr. Virginia Rochester).  Repeat 04/2017 (Dr. Lorretta Harp adenoma x1 + sigmoid diverticulosis).  Recall 5 yrs.   ESOPHAGOGASTRODUODENOSCOPY  2008   Esophagitis/gastritis/GERD.  H pylori and Barrett's NEG.   HEMORRHOID SURGERY     NOSE SURGERY     deviated septum    FAMHx:  Family History  Problem Relation Age of Onset   Diabetes Mother    Hypertension Mother    Coronary artery disease Mother    Diabetes Father    Hypertension Father    Coronary artery disease Father    Lymphoma Father        non-hodgkin   Obesity Daughter    Cancer Paternal Grandmother        unknown    SOCHx:   reports that he has never smoked. He has never used smokeless tobacco. He reports that he does not drink alcohol and does not use drugs.  ALLERGIES:  No Known Allergies  ROS: Pertinent items noted in HPI and remainder of comprehensive ROS otherwise negative.  HOME MEDS: Current Outpatient Medications on File Prior to Visit  Medication Sig Dispense Refill   atorvastatin (LIPITOR) 80 MG tablet TAKE 1 TABLET(80 MG) BY MOUTH DAILY 90 tablet 1   Continuous Blood Gluc Sensor (FREESTYLE LIBRE 3 SENSOR) MISC PLACE 1 SENSOR ON THE SKIN EVERY 14 DAYS TO CHECK BLOOD SUGAR CONTINUOSLY 3 each 2   ezetimibe (ZETIA) 10 MG tablet Take 1 tablet (10 mg total) by mouth daily. 90 tablet 1   fenofibrate (TRICOR) 145 MG tablet TAKE 1 TABLET(145 MG) BY MOUTH DAILY 90 tablet 1   hydrochlorothiazide (HYDRODIURIL) 25 MG tablet TAKE 1 TABLET(25 MG) BY MOUTH DAILY 90 tablet 1    Insulin Pen Needle (PEN NEEDLES) 31G X 5 MM MISC 1 each by Does not apply route daily. 100 each 5   metFORMIN (GLUCOPHAGE) 1000 MG tablet TAKE 1 TABLET(1000 MG) BY MOUTH TWICE DAILY 180 tablet 1   pantoprazole (PROTONIX) 40 MG tablet TAKE 1 TABLET(40 MG) BY MOUTH DAILY 90 tablet 1   TRESIBA FLEXTOUCH 100 UNIT/ML FlexTouch Pen ADMINISTER 15 UNITS UNDER THE SKIN TWICE DAILY 15 mL 1   No current facility-administered medications on file prior to visit.    LABS/IMAGING: No results found for this or any previous visit (from the past 48 hour(s)). No results found.  LIPID PANEL:    Component Value Date/Time   CHOL 202 (H) 12/28/2022 0852   TRIG 196.0 (H) 12/28/2022 0852   HDL 39.00 (L) 12/28/2022 0852   CHOLHDL 5 12/28/2022 0852   VLDL 39.2 12/28/2022 0852   LDLCALC 124 (H) 12/28/2022 0852   LDLDIRECT 127.0 12/31/2020 0826    WEIGHTS: Wt Readings from Last 3 Encounters:  03/28/23 206 lb (93.4 kg)  12/28/22 206 lb 3.2 oz (93.5 kg)  10/10/22 201 lb 9.6 oz (91.4 kg)    VITALS: BP (!) 152/88   Pulse 71   Ht 5\' 7"  (1.702 m)   Wt 206 lb (93.4 kg)   SpO2 93%   BMI 32.26 kg/m   EXAM: Deferred  EKG: Deferred  ASSESSMENT: Diabetes with mixed dyslipidemia, goal LDL less than 70 Family history of coronary artery disease in both parents Aortic atherosclerosis Fatty liver disease  PLAN: 1.   Mr. Shaun Knight has diabetes with mixed dyslipidemia and a target LDL less than 70.  His LDL remained elevated although he said he was started on additional medication in January.  It appears this was the ezetimibe.  I will plan to repeat his lipids now that it has been about 3 months since then and if he remains above target would consider a PCSK9 inhibitor.  He should also have an assessment of LP(a) given his family history of early heart disease.  I will contact him with those results and further management recommendations.  Plan then follow-up with me in about 3 to 4 months.  Thanks as always for  the kind referral.  Chrystie Nose, MD, Milagros Loll  [place]  [facility]  Medical Director of the Advanced Lipid [facility] &  Cardiovascular Risk Reduction Clinic Diplomate of the American Board of Clinical Lipidology Attending Cardiologist  Direct Dial: [phone]  Fax: [phone]  Website:  [email or web address] Nicely Lauri Purdum 03/28/2023, 10:02 AM

## 2023-03-29 LAB — NMR, LIPOPROFILE
Cholesterol, Total: 180 mg/dL (ref 100–199)
HDL Particle Number: 35.4 umol/L (ref >=30.5)
HDL-C: 31 mg/dL — ABNORMAL LOW (ref >39)
LDL Particle Number: 1695 nmol/L — ABNORMAL HIGH (ref <1000)
LDL Size: 20 nm — ABNORMAL LOW (ref >20.5)
LDL-C (NIH Calc): 110 mg/dL — ABNORMAL HIGH (ref 0–99)
LP-IR Score: 72 — ABNORMAL HIGH (ref <=45)
Small LDL Particle Number: 1179 nmol/L — ABNORMAL HIGH (ref <=527)
Triglycerides: 222 mg/dL — ABNORMAL HIGH (ref 0–149)

## 2023-03-29 LAB — LIPOPROTEIN A (LPA): Lipoprotein (a): <8.4 nmol/L (ref <75.0)

## 2023-04-04 NOTE — Telephone Encounter (Signed)
Repatha PA submitted via CMM (Key: [id number])

## 2023-04-04 NOTE — Telephone Encounter (Signed)
Approved 16-APR-24:16-OCT-24  Repatha SureClick /ML [employer] SOAJ  Quantity:6;

## 2023-04-05 ENCOUNTER — Encounter: Payer: Self-pay | Admitting: Family Medicine

## 2023-04-05 ENCOUNTER — Ambulatory Visit (INDEPENDENT_AMBULATORY_CARE_PROVIDER_SITE_OTHER): Payer: PPO | Admitting: Family Medicine

## 2023-04-05 VITALS — BP 112/73 | HR 76 | Temp 98.4°F | Ht 67.0 in | Wt 202.8 lb

## 2023-04-05 DIAGNOSIS — E119 Type 2 diabetes mellitus without complications: Secondary | ICD-10-CM

## 2023-04-05 DIAGNOSIS — F5101 Primary insomnia: Secondary | ICD-10-CM

## 2023-04-05 DIAGNOSIS — E7800 Pure hypercholesterolemia, unspecified: Secondary | ICD-10-CM | POA: Diagnosis not present

## 2023-04-05 DIAGNOSIS — Z794 Long term (current) use of insulin: Secondary | ICD-10-CM

## 2023-04-05 LAB — POCT GLYCOSYLATED HEMOGLOBIN (HGB A1C)
HbA1c POC (<> result, manual entry): 7.8 % (ref 4.0–5.6)
HbA1c, POC (controlled diabetic range): 7.8 % — AB (ref 0.0–7.0)
HbA1c, POC (prediabetic range): 7.8 % — AB (ref 5.7–6.4)
Hemoglobin A1C: 7.8 % — AB (ref 4.0–5.6)

## 2023-04-05 MED ORDER — ALPRAZOLAM 1 MG PO TABS: 1.0000 mg | ORAL_TABLET | Freq: Every day | ORAL | 1 refills | Status: DC

## 2023-04-05 MED ORDER — REPATHA SURECLICK 140 MG/ML ~~LOC~~ SOAJ: 140.0000 mg | SUBCUTANEOUS | 3 refills | Status: DC

## 2023-04-05 NOTE — Progress Notes (Signed)
OFFICE VISIT  04/05/2023  CC:  Chief Complaint  Patient presents with   Sleep Concern    Pt has trouble sleeping for at least 6 months, he tosses and turns all night. He does snore but does not recall moments of not breathing.     Patient is a 72 y.o. male who presents for trouble sleeping.  HPI: Describes a long history of getting poor sleep.  Worse the last 6 months.  Mind will not shut down.  Wakes up several times a night and finds it somewhat difficult to get back to sleep.  While asleep, his wife says he tends to roll over and move his legs a lot and do some talking in his sleep.  He does snore.  His wife says he does not stop breathing in his sleep.  He does not awaken himself with a gasp. He works constantly, runs [employer]. A friend gave him a 0.5 mg alprazolam and Rance says it helped some but he still woke up a few times.  Says glucoses fluctuate, has some measurements on his phone showing anywhere from 150s to the over 200. He does not take [place] consistently. He does take metformin 1000 mg twice a day. Earlier this months Dr. Rennis Golden did lab evaluation for his hypercholesterolemia and recommended PCSK9 inhibitor. [place] says this has been approved.  ROS as above, plus--> no fevers, no CP, no SOB, no wheezing, no cough, no dizziness, no HAs, no rashes, no melena/hematochezia.  No polyuria or polydipsia.  No myalgias or arthralgias.  No focal weakness, paresthesias, or tremors.  No acute vision or hearing abnormalities.  No dysuria or unusual/new urinary urgency or frequency.  No recent changes in lower legs. No n/v/d or abd pain.  No palpitations.     Past Medical History:  Diagnosis Date   Adenomatous colon polyp 04/2006; 2018   2007 Dr. Virginia Rochester; 2018 Dr. Misty Stanley 04/2022.   Chronic renal insufficiency, stage II (mild) 2017   CrCl 60s   Diabetes mellitus    Diverticulosis of colon    hospitalized for diverticulitis in remote past; also had episode 08/27/14   Fatty  liver 2015   Noted on noncontrast CT done for eval for kidney stones   History of ETT    LOW RISK/NEG ETT 02/2011 w/Dr. Deborah Chalk   History of hemorrhoids    Hyperlipidemia    Crestor caused leg cramps   Hypertension    Left foot pain    medial: post tibial tendonitis---podiatrist did sheath injection 03/2018 and 02/2019   Nephrolithiasis    Saw urologist in DISTANT past (?[facility]). Left mid ureteral stone 01/2016--ED visit.   Prostate nodule 08/2018   Referred to urol (PSA was normal/unchanged from prev year).   Renal cyst, left    hemorrhagic vs proteinacious (last f/u CT 2007 showed stability)--Dr. Virginia Rochester.   Shoulder pain, right     Injection on 2 consec mo at [facility] (Dr. August Saucer) 2012.  [facility] jt inj 12/2022 ortho    Past Surgical History:  Procedure Laterality Date   CHALAZION EXCISION  summer 2015   COLONOSCOPY W/ POLYPECTOMY  04/2006; 04/2017   2007: Adenomatous polyps x 2; recall 5 yrs (Dr. Virginia Rochester).  Repeat 04/2017 (Dr. Lorretta Harp adenoma x1 + sigmoid diverticulosis).  Recall 5 yrs.   ESOPHAGOGASTRODUODENOSCOPY  2008   Esophagitis/gastritis/GERD.  H pylori and Barrett's NEG.   HEMORRHOID SURGERY     NOSE SURGERY     deviated septum    Outpatient Medications Prior to  Visit  Medication Sig Dispense Refill   atorvastatin (LIPITOR) 80 MG tablet TAKE 1 TABLET(80 MG) BY MOUTH DAILY 90 tablet 1   Continuous Blood Gluc Sensor (FREESTYLE LIBRE 3 SENSOR) MISC PLACE 1 SENSOR ON THE SKIN EVERY 14 DAYS TO CHECK BLOOD SUGAR CONTINUOSLY 3 each 2   Evolocumab (REPATHA SURECLICK) 140 MG/ML SOAJ Inject 140 mg into the skin every 14 (fourteen) days. 6 mL 3   ezetimibe (ZETIA) 10 MG tablet Take 1 tablet (10 mg total) by mouth daily. 90 tablet 1   fenofibrate (TRICOR) 145 MG tablet TAKE 1 TABLET(145 MG) BY MOUTH DAILY 90 tablet 1   hydrochlorothiazide (HYDRODIURIL) 25 MG tablet TAKE 1 TABLET(25 MG) BY MOUTH DAILY 90 tablet 1   Insulin Pen Needle (PEN NEEDLES) 31G X 5 MM MISC 1 each by Does not  apply route daily. 100 each 5   metFORMIN (GLUCOPHAGE) 1000 MG tablet TAKE 1 TABLET(1000 MG) BY MOUTH TWICE DAILY 180 tablet 1   pantoprazole (PROTONIX) 40 MG tablet TAKE 1 TABLET(40 MG) BY MOUTH DAILY 90 tablet 1   TRESIBA FLEXTOUCH 100 UNIT/ML FlexTouch Pen ADMINISTER 15 UNITS UNDER THE SKIN TWICE DAILY 15 mL 1   No facility-administered medications prior to visit.    No Known Allergies  Review of Systems  As per HPI  PE:    04/05/2023   11:12 AM 03/28/2023    9:47 AM 01/16/2023   11:26 AM  Vitals with BMI  Height 5\' 7"  5\' 7"    Weight 202 lbs 13 oz 206 lbs   BMI 31.76 32.26   Systolic 112 152 161  Diastolic 73 88 79  Pulse 76 71      Physical Exam  Gen: Alert, well appearing.  Patient is oriented to person, place, time, and situation. AFFECT: pleasant, lucid thought and speech. No further exam today.  LABS:  Last CBC Lab Results  Component Value Date   WBC 10.7 (H) 08/24/2021   HGB 17.0 08/24/2021   HCT 48.7 08/24/2021   MCV 88.9 08/24/2021   MCH 31.0 08/24/2021   RDW 12.6 08/24/2021   PLT 248 08/24/2021   Last metabolic panel Lab Results  Component Value Date   GLUCOSE 91 12/28/2022   NA 138 12/28/2022   K 4.1 12/28/2022   CL 97 12/28/2022   CO2 31 12/28/2022   BUN 21 12/28/2022   CREATININE 1.28 12/28/2022   GFRNONAA 53 (L) 08/24/2021   CALCIUM 9.5 12/28/2022   PROT 7.1 12/28/2022   ALBUMIN 4.5 12/28/2022   BILITOT 0.9 12/28/2022   ALKPHOS 26 (L) 12/28/2022   AST 31 12/28/2022   ALT 29 12/28/2022   ANIONGAP 10 08/24/2021   Last lipids Lab Results  Component Value Date   CHOL 202 (H) 12/28/2022   HDL 39.00 (L) 12/28/2022   LDLCALC 124 (H) 12/28/2022   LDLDIRECT 127.0 12/31/2020   TRIG 196.0 (H) 12/28/2022   CHOLHDL 5 12/28/2022   Last hemoglobin A1c Lab Results  Component Value Date   HGBA1C 7.8 (A) 04/05/2023   HGBA1C 7.8 04/05/2023   HGBA1C 7.8 (A) 04/05/2023   HGBA1C 7.8 (A) 04/05/2023   Last thyroid functions Lab Results   Component Value Date   TSH 1.22 08/28/2018   IMPRESSION AND PLAN:  #1 chronic insomnia. Will start alprazolam 1 mg nightly.  Therapeutic expectations and side effect profile of medication discussed today.  Patient's questions answered.  #2 diabetes without complication.  Control is fair. POC Hba1c today is 7.8%. We discussed again the  general approach to long-acting insulin use, encouraged him to use this regularly.  He has some apprehension about hypoglycemia so I think he tends to underdose some.  Diet tends to vary quite a bit. If A1c gradually worsening in the future will discuss basal-bolus insulin regimen. For now we will hold off. Continue 1000 mg metformin twice a day and [place].  #3 mixed hyperlipidemia. He has had evaluation by advanced lipid clinic. The recommended Repatha and this was approved by his insurer. He is in the process of following up with their office to get this prescription sent in.  An After Visit Summary was printed and given to the patient.  FOLLOW UP: Return in about 3 months (around 07/05/2023) for annual CPE (fasting).  Signed:  Santiago Bumpers, MD           04/05/2023

## 2023-04-05 NOTE — Addendum Note (Signed)
Addended by: Lindell Spar on: 04/05/2023 10:41 AM   Modules accepted: Orders

## 2023-04-19 ENCOUNTER — Ambulatory Visit (INDEPENDENT_AMBULATORY_CARE_PROVIDER_SITE_OTHER): Payer: PPO

## 2023-04-19 VITALS — BP 126/78 | HR 71 | Temp 97.7°F | Ht 69.0 in | Wt 204.6 lb

## 2023-04-19 DIAGNOSIS — Z0000 Encounter for general adult medical examination without abnormal findings: Secondary | ICD-10-CM | POA: Diagnosis not present

## 2023-04-19 DIAGNOSIS — Z1211 Encounter for screening for malignant neoplasm of colon: Secondary | ICD-10-CM

## 2023-04-19 NOTE — Patient Instructions (Signed)
Health Maintenance, Male Adopting a healthy lifestyle and getting preventive care are important in promoting health and wellness. Ask your health care provider about: The right schedule for you to have regular tests and exams. Things you can do on your own to prevent diseases and keep yourself healthy. What should I know about diet, weight, and exercise? Eat a healthy diet  Eat a diet that includes plenty of vegetables, fruits, low-fat dairy products, and lean protein. Do not eat a lot of foods that are high in solid fats, added sugars, or sodium. Maintain a healthy weight Body mass index (BMI) is a measurement that can be used to identify possible weight problems. It estimates body fat based on height and weight. Your health care provider can help determine your BMI and help you achieve or maintain a healthy weight. Get regular exercise Get regular exercise. This is one of the most important things you can do for your health. Most adults should: Exercise for at least 150 minutes each week. The exercise should increase your heart rate and make you sweat (moderate-intensity exercise). Do strengthening exercises at least twice a week. This is in addition to the moderate-intensity exercise. Spend less time sitting. Even light physical activity can be beneficial. Watch cholesterol and blood lipids Have your blood tested for lipids and cholesterol at 72 years of age, then have this test every 5 years. You may need to have your cholesterol levels checked more often if: Your lipid or cholesterol levels are high. You are older than 72 years of age. You are at high risk for heart disease. What should I know about cancer screening? Many types of cancers can be detected early and may often be prevented. Depending on your health history and family history, you may need to have cancer screening at various ages. This may include screening for: Colorectal cancer. Prostate cancer. Skin cancer. Lung  cancer. What should I know about heart disease, diabetes, and high blood pressure? Blood pressure and heart disease High blood pressure causes heart disease and increases the risk of stroke. This is more likely to develop in people who have high blood pressure readings or are overweight. Talk with your health care provider about your target blood pressure readings. Have your blood pressure checked: Every 3-5 years if you are 18-39 years of age. Every year if you are 40 years old or older. If you are between the ages of 65 and 75 and are a current or former smoker, ask your health care provider if you should have a one-time screening for abdominal aortic aneurysm (AAA). Diabetes Have regular diabetes screenings. This checks your fasting blood sugar level. Have the screening done: Once every three years after age 45 if you are at a normal weight and have a low risk for diabetes. More often and at a younger age if you are overweight or have a high risk for diabetes. What should I know about preventing infection? Hepatitis B If you have a higher risk for hepatitis B, you should be screened for this virus. Talk with your health care provider to find out if you are at risk for hepatitis B infection. Hepatitis C Blood testing is recommended for: Everyone born from 1945 through 1965. Anyone with known risk factors for hepatitis C. Sexually transmitted infections (STIs) You should be screened each year for STIs, including gonorrhea and chlamydia, if: You are sexually active and are younger than 72 years of age. You are older than 72 years of age and your   health care provider tells you that you are at risk for this type of infection. Your sexual activity has changed since you were last screened, and you are at increased risk for chlamydia or gonorrhea. Ask your health care provider if you are at risk. Ask your health care provider about whether you are at high risk for HIV. Your health care provider  may recommend a prescription medicine to help prevent HIV infection. If you choose to take medicine to prevent HIV, you should first get tested for HIV. You should then be tested every 3 months for as long as you are taking the medicine. Follow these instructions at home: Alcohol use Do not drink alcohol if your health care provider tells you not to drink. If you drink alcohol: Limit how much you have to 0-2 drinks a day. Know how much alcohol is in your drink. In the [place] one drink equals one 12 oz bottle of beer (355 mL), one 5 oz glass of wine (148 mL), or one 1 oz glass of hard liquor (44 mL). Lifestyle Do not use any products that contain nicotine or tobacco. These products include cigarettes, chewing tobacco, and vaping devices, such as e-cigarettes. If you need help quitting, ask your health care provider. Do not use street drugs. Do not share needles. Ask your health care provider for help if you need support or information about quitting drugs. General instructions Schedule regular health, dental, and eye exams. Stay current with your vaccines. Tell your health care provider if: You often feel depressed. You have ever been abused or do not feel safe at home. Summary Adopting a healthy lifestyle and getting preventive care are important in promoting health and wellness. Follow your health care provider's instructions about healthy diet, exercising, and getting tested or screened for diseases. Follow your health care provider's instructions on monitoring your cholesterol and blood pressure. This information is not intended to replace advice given to you by your health care provider. Make sure you discuss any questions you have with your health care provider. Document Revised: 04/26/2021 Document Reviewed: 04/26/2021 Elsevier Patient Education  2023 [employer]  

## 2023-04-19 NOTE — Progress Notes (Signed)
Subjective:   Shaun Knight is a 72 y.o. male who presents for Medicare Annual/Subsequent preventive examination.  Review of Systems     Cardiac Risk Factors include: advanced age (>41men, >38 women);diabetes mellitus;male gender;obesity (BMI >30kg/m2);dyslipidemia;hypertension        Objective:    There were no vitals filed for this visit. There is no height or weight on file to calculate BMI.     04/19/2023    9:34 AM 04/13/2022    8:43 AM 08/24/2021    6:55 PM 03/31/2021    8:15 AM 10/08/2019    5:03 PM 08/25/2017   10:59 AM  Advanced Directives  Does Patient Have a Medical Advance Directive? Yes Yes No Yes No Yes  Type of Advance Directive Living will;Healthcare Power of [employer] Power of [employer] Power of [place];Living will  Living will  Copy of Healthcare Power of Attorney in Chart?  No - copy requested  No - copy requested      Current Medications (verified) Outpatient Encounter Medications as of 04/19/2023  Medication Sig   ALPRAZolam (XANAX) 1 MG tablet Take 1 tablet (1 mg total) by mouth at bedtime.   atorvastatin (LIPITOR) 80 MG tablet TAKE 1 TABLET(80 MG) BY MOUTH DAILY   Continuous Blood Gluc Sensor (FREESTYLE LIBRE 3 SENSOR) MISC PLACE 1 SENSOR ON THE SKIN EVERY 14 DAYS TO CHECK BLOOD SUGAR CONTINUOSLY   ezetimibe (ZETIA) 10 MG tablet Take 1 tablet (10 mg total) by mouth daily.   fenofibrate (TRICOR) 145 MG tablet TAKE 1 TABLET(145 MG) BY MOUTH DAILY   hydrochlorothiazide (HYDRODIURIL) 25 MG tablet TAKE 1 TABLET(25 MG) BY MOUTH DAILY   Insulin Pen Needle (PEN NEEDLES) 31G X 5 MM MISC 1 each by Does not apply route daily.   metFORMIN (GLUCOPHAGE) 1000 MG tablet TAKE 1 TABLET(1000 MG) BY MOUTH TWICE DAILY   pantoprazole (PROTONIX) 40 MG tablet TAKE 1 TABLET(40 MG) BY MOUTH DAILY   TRESIBA FLEXTOUCH 100 UNIT/ML FlexTouch Pen ADMINISTER 15 UNITS UNDER THE SKIN TWICE DAILY   Evolocumab (REPATHA SURECLICK) 140 MG/ML SOAJ Inject 140 mg into the skin  every 14 (fourteen) days. (Patient not taking: Reported on 04/19/2023)   No facility-administered encounter medications on file as of 04/19/2023.    Allergies (verified) Patient has no known allergies.   History: Past Medical History:  Diagnosis Date   Adenomatous colon polyp 04/2006; 2018   2007 Dr. Virginia Rochester; 2018 Dr. Misty Stanley 04/2022.   Chronic renal insufficiency, stage II (mild) 2017   CrCl 60s   Diabetes mellitus    Diverticulosis of colon    hospitalized for diverticulitis in remote past; also had episode 08/27/14   Fatty liver 2015   Noted on noncontrast CT done for eval for kidney stones   History of ETT    LOW RISK/NEG ETT 02/2011 w/Dr. Deborah Chalk   History of hemorrhoids    Hyperlipidemia    Crestor caused leg cramps   Hypertension    Left foot pain    medial: post tibial tendonitis---podiatrist did sheath injection 03/2018 and 02/2019   Nephrolithiasis    Saw urologist in DISTANT past ([facility]). Left mid ureteral stone 01/2016--ED visit.   Prostate nodule 08/2018   Referred to urol (PSA was normal/unchanged from prev year).   Renal cyst, left    hemorrhagic vs proteinacious (last f/u CT 2007 showed stability)--Dr. Virginia Rochester.   Shoulder pain, right     Injection on 2 consec mo at [facility] (Dr. August Saucer) 2012.  [facility] jt  inj 12/2022 ortho   Past Surgical History:  Procedure Laterality Date   CHALAZION EXCISION  summer 2015   COLONOSCOPY W/ POLYPECTOMY  04/2006; 04/2017   2007: Adenomatous polyps x 2; recall 5 yrs (Dr. Virginia Rochester).  Repeat 04/2017 (Dr. Lorretta Harp adenoma x1 + sigmoid diverticulosis).  Recall 5 yrs.   ESOPHAGOGASTRODUODENOSCOPY  2008   Esophagitis/gastritis/GERD.  H pylori and Barrett's NEG.   HEMORRHOID SURGERY     NOSE SURGERY     deviated septum   Family History  Problem Relation Age of Onset   Diabetes Mother    Hypertension Mother    Coronary artery disease Mother    Diabetes Father    Hypertension Father    Coronary artery disease Father    Lymphoma Father         non-hodgkin   Obesity Daughter    Cancer Paternal Grandmother        unknown   Social History   Socioeconomic History   Marital status: Married    Spouse name: Not on file   Number of children: Not on file   Years of education: Not on file   Highest education level: 12th grade  Occupational History   Not on file  Tobacco Use   Smoking status: Never   Smokeless tobacco: Never  Vaping Use   Vaping Use: Never used  Substance and Sexual Activity   Alcohol use: No   Drug use: No   Sexual activity: Yes    Partners: Female  Other Topics Concern   Not on file  Social History Narrative   Married, 1 son and 1 daughter.   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   No T/A/Ds.   Social Determinants of Health   Financial Resource Strain: Low Risk  (04/19/2023)   Overall Financial Resource Strain (CARDIA)    Difficulty of Paying Living Expenses: Not hard at all  Food Insecurity: No Food Insecurity (04/19/2023)   Hunger Vital Sign    Worried About Running Out of Food in the Last Year: Never true    Ran Out of Food in the Last Year: Never true  Transportation Needs: No Transportation Needs (04/19/2023)   PRAPARE - Administrator, Civil Service (Medical): No    Lack of Transportation (Non-Medical): No  Physical Activity: Inactive (04/19/2023)   Exercise Vital Sign    Days of Exercise per Week: 3 days    Minutes of Exercise per Session: 0 min  Stress: Stress Concern Present (04/19/2023)   [employer] of Occupational Health - Occupational Stress Questionnaire    Feeling of Stress : To some extent  Social Connections: Socially Integrated (04/19/2023)   Social Connection and Isolation Panel [NHANES]    Frequency of Communication with Friends and Family: More than three times a week    Frequency of Social Gatherings with Friends and Family: More than three times a week    Attends Religious Services: More than 4 times per year     Active Member of [employer] or Organizations: Yes    Attends Engineer, structural: More than 4 times per year    Marital Status: Married    Tobacco Counseling Counseling given: Not Answered   Clinical Intake:  Pre-visit preparation completed: Yes  Pain : No/denies pain     Nutritional Risks: None Diabetes: Yes CBG done?: No Did pt. bring in CBG monitor from home?: Yes Glucose Meter Downloaded?: No  How often do you need to have someone  help you when you read instructions, pamphlets, or other written materials from your doctor or pharmacy?: 1 - Never  Diabetic?YES  Interpreter Needed?: No  Information entered by :: Tyshana Nishida,CMA   Activities of Daily Living    04/19/2023    9:35 AM  In your present state of health, do you have any difficulty performing the following activities:  Hearing? 0  Vision? 0  Difficulty concentrating or making decisions? 0  Walking or climbing stairs? 0  Dressing or bathing? 0  Doing errands, shopping? 0  Preparing Food and eating ? N  Using the Toilet? N  In the past six months, have you accidently leaked urine? N  Do you have problems with loss of bowel control? N  Managing your Medications? N  Managing your Finances? N  Housekeeping or managing your Housekeeping? N    Patient Care Team: Jeoffrey Massed, MD as PCP - General (Family Medicine) Rachael Fee, MD as Consulting Physician (Gastroenterology) Charlsie Merles Kirstie Peri, DPM as Consulting Physician (Podiatry) Rodolph Bong, MD as Consulting Physician (Sports Medicine) Mat Carne, DO (Optometry) Skene, Lisette Abu, MD as Consulting Physician (Cardiology)  Indicate any recent Medical Services you may have received from other than [facility] providers in the past year (date may be approximate).     Assessment:   This is a routine wellness examination for Morton.  Hearing/Vision screen Hearing Screening - Comments:: No hearing concerns   Dietary issues and exercise  activities discussed: Current Exercise Habits: The patient has a physically strenuous job, but has no regular exercise apart from work.   Goals Addressed   None   Depression Screen    04/19/2023    9:23 AM 04/13/2022    8:41 AM 12/30/2021    8:48 AM 03/31/2021    8:18 AM 09/17/2019    9:36 AM 08/28/2018    8:24 AM 08/25/2017   11:00 AM  PHQ 2/9 Scores  PHQ - 2 Score 0 0 0 0 0 0 0    Fall Risk    04/19/2023    9:35 AM 04/04/2023   10:01 AM 04/13/2022    8:43 AM 12/30/2021    8:48 AM 03/31/2021    8:17 AM  Fall Risk   Falls in the past year? 0 0 0 0 0  Number falls in past yr: 0  0 0 0  Injury with Fall? 1  0 0 0  Risk for fall due to : No Fall Risks      Follow up Falls evaluation completed  Falls prevention discussed Falls evaluation completed Falls prevention discussed    FALL RISK PREVENTION PERTAINING TO THE HOME:  Any stairs in or around the home? No  If so, are there any without handrails? No  Home free of loose throw rugs in walkways, pet beds, electrical cords, etc? Yes  Adequate lighting in your home to reduce risk of falls? Yes   ASSISTIVE DEVICES UTILIZED TO PREVENT FALLS:  Life alert? No  Use of a cane, walker or w/c? No  Grab bars in the bathroom? Yes  Shower chair or bench in shower? Yes  Elevated toilet seat or a handicapped toilet? No   TIMED UP AND GO:  Was the test performed? Yes .  Length of time to ambulate 10 feet: 10 sec.   Gait steady without assistive device   Cognitive Function:        04/19/2023    9:36 AM 04/13/2022    8:46 AM  6CIT Screen  What Year? 0 points 0 points  What month? 0 points 0 points  What time? 0 points 0 points  Count back from 20 0 points 0 points  Months in reverse 0 points 0 points  Repeat phrase 0 points 4 points  Total Score 0 points 4 points    Immunizations Immunization History  Administered Date(s) Administered   Fluad Quad(high Dose 65+) 09/17/2019, 12/31/2020   Hepatitis A, Adult 08/11/2014   Influenza  Whole 10/19/2010, 10/04/2011   Influenza, High Dose Seasonal PF 10/24/2016, 08/25/2017, 08/28/2018   Influenza,inj,Quad PF,6+ Mos 08/11/2014, 09/30/2015   Influenza-Unspecified 12/08/2021   PFIZER(Purple Top)SARS-COV-2 Vaccination 01/08/2020, 01/29/2020, 11/11/2020   Pneumococcal Conjugate-13 08/25/2017   Pneumococcal Polysaccharide-23 07/27/2012, 08/28/2018   Tdap 07/27/2012   Zoster, Live 11/13/2011    TDAP status: Due, Education has been provided regarding the importance of this vaccine. Advised may receive this vaccine at local pharmacy or Health Dept. Aware to provide a copy of the vaccination record if obtained from local pharmacy or Health Dept. Verbalized acceptance and understanding.  Flu Vaccine status: Up to date  Pneumococcal vaccine status: Up to date  Covid-19 vaccine status: Completed vaccines  Qualifies for Shingles Vaccine? Yes   Zostavax completed  N/A   Shingrix Completed?: No.    Education has been provided regarding the importance of this vaccine. Patient has been advised to call insurance company to determine out of pocket expense if they have not yet received this vaccine. Advised may also receive vaccine at local pharmacy or Health Dept. Verbalized acceptance and understanding.  Screening Tests Health Maintenance  Topic Date Due   Zoster Vaccines- Shingrix (1 of 2) Never done   COLONOSCOPY (Pts 45-55yrs Insurance coverage will need to be confirmed)  04/25/2022   OPHTHALMOLOGY EXAM  05/14/2022   DTaP/Tdap/Td (2 - Td or Tdap) 07/27/2022   COVID-19 Vaccine (4 - 2023-24 season) 08/19/2022   FOOT EXAM  12/30/2022   INFLUENZA VACCINE  07/20/2023   HEMOGLOBIN A1C  10/05/2023   Diabetic kidney evaluation - eGFR measurement  12/29/2023   Diabetic kidney evaluation - Urine ACR  12/29/2023   Medicare Annual Wellness (AWV)  04/18/2024   Pneumonia Vaccine 34+ Years old  Completed   Hepatitis C Screening  Completed   HPV VACCINES  Aged Out    Health  Maintenance  Health Maintenance Due  Topic Date Due   Zoster Vaccines- Shingrix (1 of 2) Never done   COLONOSCOPY (Pts 45-57yrs Insurance coverage will need to be confirmed)  04/25/2022   OPHTHALMOLOGY EXAM  05/14/2022   DTaP/Tdap/Td (2 - Td or Tdap) 07/27/2022   COVID-19 Vaccine (4 - 2023-24 season) 08/19/2022   FOOT EXAM  12/30/2022    Colorectal cancer screening: Referral to GI placed [facility]. Pt aware the office will call re: appt.   Additional Screening:  Hepatitis C Screening: does qualify; Completed 08/25/2017  Vision Screening: Recommended annual ophthalmology exams for early detection of glaucoma and other disorders of the eye. Is the patient up to date with their annual eye exam?  No  Who is the provider or what is the name of the office in which the patient attends annual eye exams? [facility]  If pt is not established with a provider, would they like to be referred to a provider to establish care?  N/A .   Dental Screening: Recommended annual dental exams for proper oral hygiene  Community Resource Referral / Chronic Care Management: CRR required this visit?  No  CCM required this visit?  No      Plan:     I have personally reviewed and noted the following in the patient's chart:   Medical and social history Use of alcohol, tobacco or illicit drugs  Current medications and supplements including opioid prescriptions. Patient is not currently taking opioid prescriptions. Functional ability and status Nutritional status Physical activity Advanced directives List of other physicians Hospitalizations, surgeries, and ER visits in previous 12 months Vitals Screenings to include cognitive, depression, and falls Referrals and appointments  In addition, I have reviewed and discussed with patient certain preventive protocols, quality metrics, and best practice recommendations. A written personalized care plan for preventive services as well as general  preventive health recommendations were provided to patient.     Victorio Palm, [facility]   04/19/2023   Nurse Notes: Face to Face 30 minute visit with patient.    Mr. Rapozo , Thank you for taking time to come for your Medicare Wellness Visit. I appreciate your ongoing commitment to your health goals. Please review the following plan we discussed and let me know if I can assist you in the future.   These are the goals we discussed:  Goals      Patient Stated     Maintain current active lifestyle     Patient Stated     None at this time         This is a list of the screening recommended for you and due dates:  Health Maintenance  Topic Date Due   Zoster (Shingles) Vaccine (1 of 2) Never done   Colon Cancer Screening  04/25/2022   Eye exam for diabetics  05/14/2022   DTaP/Tdap/Td vaccine (2 - Td or Tdap) 07/27/2022   COVID-19 Vaccine (4 - 2023-24 season) 08/19/2022   Complete foot exam   12/30/2022   Flu Shot  07/20/2023   Hemoglobin A1C  10/05/2023   Yearly kidney function blood test for diabetes  12/29/2023   Yearly kidney health urinalysis for diabetes  12/29/2023   Medicare Annual Wellness Visit  04/18/2024   Pneumonia Vaccine  Completed   Hepatitis C Screening: USPSTF Recommendation to screen - Ages 18-79 yo.  Completed   HPV Vaccine  Aged Out

## 2023-04-20 ENCOUNTER — Encounter: Payer: Self-pay | Admitting: Family Medicine

## 2023-04-20 ENCOUNTER — Ambulatory Visit (INDEPENDENT_AMBULATORY_CARE_PROVIDER_SITE_OTHER): Payer: PPO | Admitting: Family Medicine

## 2023-04-20 VITALS — BP 138/84 | HR 80 | Wt 204.0 lb

## 2023-04-20 DIAGNOSIS — Z794 Long term (current) use of insulin: Secondary | ICD-10-CM

## 2023-04-20 DIAGNOSIS — E782 Mixed hyperlipidemia: Secondary | ICD-10-CM | POA: Diagnosis not present

## 2023-04-20 DIAGNOSIS — L02429 Furuncle of limb, unspecified: Secondary | ICD-10-CM

## 2023-04-20 DIAGNOSIS — T887XXA Unspecified adverse effect of drug or medicament, initial encounter: Secondary | ICD-10-CM | POA: Diagnosis not present

## 2023-04-20 DIAGNOSIS — E1165 Type 2 diabetes mellitus with hyperglycemia: Secondary | ICD-10-CM | POA: Diagnosis not present

## 2023-04-20 MED ORDER — TETANUS-DIPHTH-ACELL PERTUSSIS 5-2-15.5 LF-MCG/0.5 IM SUSP: 0.5000 mL | Freq: Once | INTRAMUSCULAR | 0 refills | Status: AC

## 2023-04-20 MED ORDER — SHINGRIX 50 MCG/0.5ML IM SUSR: 0.5000 mL | Freq: Once | INTRAMUSCULAR | 0 refills | Status: AC

## 2023-04-20 NOTE — Progress Notes (Signed)
OFFICE VISIT  04/20/2023  CC:  Chief Complaint  Patient presents with   Acute Visit    Has a knot on right arm. Wants to discuss changing repatha.    Patient is a 72 y.o. male who presents for concerns about a sore spot on the back of his arm.  HPI: He had a sore bump that started after he took the glucose sensor off the back of his right arm. The pain is gone away but there is still a swollen spot.  He is worried that maybe he broke the needle off in his arm.  So, he has just started Repatha.  He took his first injection about 1-1/2 weeks ago.  Couple days after the injection he noted that his glucoses began going very high.  He started to give himself more insulin in response and almost had a couple of low sugar reactions.  Past Medical History:  Diagnosis Date   Adenomatous colon polyp 04/2006; 2018   2007 Dr. Virginia Rochester; 2018 Dr. Misty Stanley 04/2022.   Chronic renal insufficiency, stage II (mild) 2017   CrCl 60s   Diabetes mellitus    Diverticulosis of colon    hospitalized for diverticulitis in remote past; also had episode 08/27/14   Fatty liver 2015   Noted on noncontrast CT done for eval for kidney stones   History of ETT    LOW RISK/NEG ETT 02/2011 w/Dr. Deborah Chalk   History of hemorrhoids    Hyperlipidemia    Crestor caused leg cramps   Hypertension    Left foot pain    medial: post tibial tendonitis---podiatrist did sheath injection 03/2018 and 02/2019   Nephrolithiasis    Saw urologist in DISTANT past (?[facility]). Left mid ureteral stone 01/2016--ED visit.   Prostate nodule 08/2018   Referred to urol (PSA was normal/unchanged from prev year).   Renal cyst, left    hemorrhagic vs proteinacious (last f/u CT 2007 showed stability)--Dr. Virginia Rochester.   Shoulder pain, right     Injection on 2 consec mo at [facility] (Dr. August Saucer) 2012.  [facility] jt inj 12/2022 ortho    Past Surgical History:  Procedure Laterality Date   CHALAZION EXCISION  summer 2015   COLONOSCOPY W/ POLYPECTOMY  04/2006;  04/2017   2007: Adenomatous polyps x 2; recall 5 yrs (Dr. Virginia Rochester).  Repeat 04/2017 (Dr. Lorretta Harp adenoma x1 + sigmoid diverticulosis).  Recall 5 yrs.   ESOPHAGOGASTRODUODENOSCOPY  2008   Esophagitis/gastritis/GERD.  H pylori and Barrett's NEG.   HEMORRHOID SURGERY     NOSE SURGERY     deviated septum    Outpatient Medications Prior to Visit  Medication Sig Dispense Refill   ALPRAZolam (XANAX) 1 MG tablet Take 1 tablet (1 mg total) by mouth at bedtime. 90 tablet 1   atorvastatin (LIPITOR) 80 MG tablet TAKE 1 TABLET(80 MG) BY MOUTH DAILY 90 tablet 1   Continuous Blood Gluc Sensor (FREESTYLE LIBRE 3 SENSOR) MISC PLACE 1 SENSOR ON THE SKIN EVERY 14 DAYS TO CHECK BLOOD SUGAR CONTINUOSLY 3 each 2   ezetimibe (ZETIA) 10 MG tablet Take 1 tablet (10 mg total) by mouth daily. 90 tablet 1   fenofibrate (TRICOR) 145 MG tablet TAKE 1 TABLET(145 MG) BY MOUTH DAILY 90 tablet 1   hydrochlorothiazide (HYDRODIURIL) 25 MG tablet TAKE 1 TABLET(25 MG) BY MOUTH DAILY 90 tablet 1   Insulin Pen Needle (PEN NEEDLES) 31G X 5 MM MISC 1 each by Does not apply route daily. 100 each 5   metFORMIN (GLUCOPHAGE) 1000  MG tablet TAKE 1 TABLET(1000 MG) BY MOUTH TWICE DAILY 180 tablet 1   pantoprazole (PROTONIX) 40 MG tablet TAKE 1 TABLET(40 MG) BY MOUTH DAILY 90 tablet 1   TRESIBA FLEXTOUCH 100 UNIT/ML FlexTouch Pen ADMINISTER 15 UNITS UNDER THE SKIN TWICE DAILY 15 mL 1   Evolocumab (REPATHA SURECLICK) 140 MG/ML SOAJ Inject 140 mg into the skin every 14 (fourteen) days. (Patient not taking: Reported on 04/19/2023) 6 mL 3   No facility-administered medications prior to visit.    No Known Allergies  Review of Systems  As per HPI  PE:    04/20/2023   11:11 AM 04/19/2023    9:43 AM 04/05/2023   11:12 AM  Vitals with BMI  Height  5\' 9"  5\' 7"   Weight 204 lbs 204 lbs 10 oz 202 lbs 13 oz  BMI 30.11 30.2 31.76  Systolic 138 126 161  Diastolic 84 78 73  Pulse 80 71 76     Physical Exam  Gen: Alert, well appearing.   Patient is oriented to person, place, time, and situation. Right triceps area with 1 cm pink subcutaneous nodule.  No fluctuance or erythema or tenderness.  No palpable foreign body.  Ultrasound utilized today to look for foreign body and none was found.  LABS:  Last CBC Lab Results  Component Value Date   WBC 10.7 (H) 08/24/2021   HGB 17.0 08/24/2021   HCT 48.7 08/24/2021   MCV 88.9 08/24/2021   MCH 31.0 08/24/2021   RDW 12.6 08/24/2021   PLT 248 08/24/2021   Last hemoglobin A1c Lab Results  Component Value Date   HGBA1C 7.8 (A) 04/05/2023   HGBA1C 7.8 04/05/2023   HGBA1C 7.8 (A) 04/05/2023   HGBA1C 7.8 (A) 04/05/2023     Chemistry      Component Value Date/Time   NA 138 12/28/2022 0852   K 4.1 12/28/2022 0852   CL 97 12/28/2022 0852   CO2 31 12/28/2022 0852   BUN 21 12/28/2022 0852   CREATININE 1.28 12/28/2022 0852      Component Value Date/Time   CALCIUM 9.5 12/28/2022 0852   ALKPHOS 26 (L) 12/28/2022 0852   AST 31 12/28/2022 0852   ALT 29 12/28/2022 0852   BILITOT 0.9 12/28/2022 0852     Lab Results  Component Value Date   CHOL 202 (H) 12/28/2022   HDL 39.00 (L) 12/28/2022   LDLCALC 124 (H) 12/28/2022   LDLDIRECT 127.0 12/31/2020   TRIG 196.0 (H) 12/28/2022   CHOLHDL 5 12/28/2022   IMPRESSION AND PLAN:  #1 right triceps focal inflammatory reaction versus hair follicle infection.  This is resolving.  Seems to have been triggered by his continuous glucose monitor sensor.  He has the sensor on the inner aspect of his left arm now and is not causing any kind of reaction. Reassured.  Continue over-the-counter antibacterial ointment.  2.  Hyperglycemia/poor diabetes control.  Seems clearly connected to taking his first injection of Repatha. He does not want to take any further of this type of medication. He is agreeable to continuing atorvastatin 80 mg a day, Zetia 10 mg a day, and fenofibrate 145 mg a day.  An After Visit Summary was printed and given to the  patient.  FOLLOW UP: No follow-ups on file.  Signed:  Santiago Bumpers, MD           04/20/2023

## 2023-05-29 ENCOUNTER — Encounter: Payer: Self-pay | Admitting: Orthopaedic Surgery

## 2023-05-29 ENCOUNTER — Other Ambulatory Visit: Payer: Self-pay

## 2023-05-29 ENCOUNTER — Ambulatory Visit (INDEPENDENT_AMBULATORY_CARE_PROVIDER_SITE_OTHER): Payer: PPO | Admitting: Sports Medicine

## 2023-05-29 ENCOUNTER — Ambulatory Visit: Payer: PPO | Admitting: Orthopaedic Surgery

## 2023-05-29 DIAGNOSIS — M25512 Pain in left shoulder: Secondary | ICD-10-CM

## 2023-05-29 DIAGNOSIS — G8929 Other chronic pain: Secondary | ICD-10-CM

## 2023-05-29 DIAGNOSIS — M25511 Pain in right shoulder: Secondary | ICD-10-CM

## 2023-05-29 MED ORDER — METHYLPREDNISOLONE ACETATE 40 MG/ML IJ SUSP
40.0000 mg | INTRAMUSCULAR | Status: AC | PRN
Start: 2023-05-29 — End: 2023-05-29
  Administered 2023-05-29: 40 mg via INTRA_ARTICULAR

## 2023-05-29 MED ORDER — LIDOCAINE HCL 1 % IJ SOLN
2.0000 mL | INTRAMUSCULAR | Status: AC | PRN
Start: 2023-05-29 — End: 2023-05-29
  Administered 2023-05-29: 2 mL

## 2023-05-29 MED ORDER — BUPIVACAINE HCL 0.25 % IJ SOLN
2.0000 mL | INTRAMUSCULAR | Status: AC | PRN
Start: 2023-05-29 — End: 2023-05-29
  Administered 2023-05-29: 2 mL via INTRA_ARTICULAR

## 2023-05-29 NOTE — Progress Notes (Signed)
The patient is a 72 year old gentleman we have seen before.  Has chronic bilateral shoulder pain.  We have previously provided subacromial steroid injection in his right shoulder that did not really help and then Dr. Shon Baton placed an injection in the right shoulder joint under ultrasound guidance that helped greatly.  We have seen him back in March for his left shoulder and the subacromial outlet injection helped some but he would prefer a deep injection in that shoulder today if possible.  He is a diabetic and does not really have great control.  He is on insulin.  His last most recent hemoglobin A1c was 7.8.  Both shoulders do seem to move well but there is a lot of pain deep in both shoulder joints.  There is no blocks to rotation and there is some weakness but mainly just deals with pain a lot of times at night.  He takes Tylenol on a regular basis.  He would prefer an intra-articular steroid injection of the left shoulder today so we will see if Dr. Shon Baton can provide this injection.  He absolutely understands that we are concerned about his blood glucose and he needs to continue to watch this closely and follow-up with his primary care physician or endocrinologist.

## 2023-05-29 NOTE — Progress Notes (Signed)
   Procedure Note  Patient: Shaun Knight             Date of Birth: Jul 15, 1951           MRN: [id number]             Visit Date: 05/29/2023  Procedures: Visit Diagnoses:  1. Chronic left shoulder pain    Large Joint Inj: L glenohumeral on 05/29/2023 9:49 AM Indications: pain Details: 22 G 3.5 in needle, ultrasound-guided posterior approach Medications: 2 mL lidocaine 1 %; 2 mL bupivacaine 0.25 %; 40 mg methylPREDNISolone acetate 40 MG/ML Outcome: tolerated well, no immediate complications  US-guided glenohumeral joint injection, left shoulder After discussion on risks/benefits/indications, informed verbal consent was obtained. A timeout was then performed. The patient was positioned lying lateral recumbent on examination table. The patient's shoulder was prepped with betadine and multiple alcohol swabs and utilizing ultrasound guidance, the patient's glenohumeral joint was identified on ultrasound. Using ultrasound guidance a 22-gauge, 3.5 inch needle with a mixture of 2:2:1 cc's lidocaine:bupivicaine:depomedrol was directed from a lateral to medial direction via in-plane technique into the glenohumeral joint with visualization of appropriate spread of injectate into the joint. Patient tolerated the procedure well without immediate complications.      Procedure, treatment alternatives, risks and benefits explained, specific risks discussed. Consent was given by the patient. Immediately prior to procedure a time out was called to verify the correct patient, procedure, equipment, support staff and site/side marked as required. Patient was prepped and draped in the usual sterile fashion.    - tolerated well  - follow-up with Dr. Magnus Ivan as indicated; I am happy to see them as needed - if his right shoulder becomes more bothersome he may present for evaluation and possible US-guided GHJ injection, but would need to wait at least 2 weeks or more to avoid increasing his blood sugars  Madelyn Brunner, DO Primary Care Sports Medicine Physician  [facility]  This note was dictated using Dragon naturally speaking software and may contain errors in syntax, spelling, or content which have not been identified prior to signing this note.

## 2023-06-06 ENCOUNTER — Other Ambulatory Visit: Payer: Self-pay | Admitting: Family Medicine

## 2023-06-09 ENCOUNTER — Other Ambulatory Visit: Payer: Self-pay | Admitting: Family Medicine

## 2023-06-23 ENCOUNTER — Other Ambulatory Visit: Payer: Self-pay

## 2023-06-23 MED ORDER — EZETIMIBE 10 MG PO TABS: 10.0000 mg | ORAL_TABLET | Freq: Every day | ORAL | 0 refills | Status: DC

## 2023-06-23 NOTE — Telephone Encounter (Signed)
RF request for ezetimibe (ZETIA) 10 MG tablet  LOV: 04/20/23  Next ov: 04/24/24 AWV, due for 3 month follow up (July) Last written: 12/29/22 (90,1)

## 2023-07-10 ENCOUNTER — Other Ambulatory Visit: Payer: Self-pay | Admitting: Family Medicine

## 2023-07-18 ENCOUNTER — Other Ambulatory Visit: Payer: Self-pay | Admitting: Family Medicine

## 2023-07-24 ENCOUNTER — Other Ambulatory Visit: Payer: Self-pay

## 2023-07-24 MED ORDER — PANTOPRAZOLE SODIUM 40 MG PO TBEC: DELAYED_RELEASE_TABLET | ORAL | 0 refills | Status: DC

## 2023-07-24 MED ORDER — METFORMIN HCL 1000 MG PO TABS: ORAL_TABLET | ORAL | 0 refills | Status: DC

## 2023-07-24 MED ORDER — HYDROCHLOROTHIAZIDE 25 MG PO TABS: ORAL_TABLET | ORAL | 1 refills | Status: DC

## 2023-07-24 MED ORDER — ATORVASTATIN CALCIUM 80 MG PO TABS: ORAL_TABLET | ORAL | 0 refills | Status: DC

## 2023-07-24 NOTE — Telephone Encounter (Signed)
RF request for metFORMIN (GLUCOPHAGE) 1000 MG tablet  LOV: 04/20/23 Next ov: AWV 04/24/24 Last written: 12/28/22 (90,1)   RF request for hydrochlorothiazide (HYDRODIURIL) 25 MG tablet  LOV: 04/20/23 Next ov: AWV 04/24/24 Last written: 12/28/22 (90,1)  RF request for atorvastatin (LIPITOR) 80 MG tablet  LOV: 04/20/23 Next ov: AWV 04/24/24 Last written: 12/28/22 (90,1)  RF request for pantoprazole (PROTONIX) 40 MG tablet  LOV: 04/20/23 Next ov: AWV 04/24/24 Last written: 12/28/22 (90,1)    Pt is currently due for RCI f/u. 30 day supply can be given until scheduled.

## 2023-07-27 ENCOUNTER — Other Ambulatory Visit: Payer: Self-pay | Admitting: Internal Medicine

## 2023-07-27 ENCOUNTER — Other Ambulatory Visit: Payer: Self-pay | Admitting: Family Medicine

## 2023-07-27 DIAGNOSIS — E785 Hyperlipidemia, unspecified: Secondary | ICD-10-CM

## 2023-08-15 ENCOUNTER — Encounter (HOSPITAL_BASED_OUTPATIENT_CLINIC_OR_DEPARTMENT_OTHER): Payer: PPO | Admitting: Internal Medicine

## 2023-09-04 ENCOUNTER — Other Ambulatory Visit: Payer: Self-pay | Admitting: Family Medicine

## 2023-09-04 NOTE — Telephone Encounter (Signed)
RF request for atorvastatin (LIPITOR) 80 MG tablet , metFORMIN (GLUCOPHAGE) 1000 MG tablet  LOV: 04/20/23, acute and 04/05/23 RCI Next ov: advised to f/u 3 mo CPE Last written: 07/24/23 (0,0)   Pt needs an appt

## 2023-09-06 ENCOUNTER — Other Ambulatory Visit: Payer: Self-pay

## 2023-09-06 ENCOUNTER — Telehealth: Payer: Self-pay | Admitting: Family Medicine

## 2023-09-06 MED ORDER — FREESTYLE LIBRE 3 SENSOR MISC: 2 refills | Status: DC

## 2023-09-06 MED ORDER — ALPRAZOLAM 1 MG PO TABS: 1.0000 mg | ORAL_TABLET | Freq: Every day | ORAL | 1 refills | Status: DC

## 2023-09-06 NOTE — Telephone Encounter (Signed)
Sent to provider for refill

## 2023-09-06 NOTE — Telephone Encounter (Signed)
Patient is currently out of medications Continuous Glucose Sensor (FREESTYLE LIBRE 3 SENSOR) MISC, ALPRAZolam (XANAX) 1 MG tablet   He is requesting a fill until his appt on Monday.  Pharmacy is [facility] in [place]

## 2023-09-06 NOTE — Telephone Encounter (Signed)
Refill requested for Xanax sent to [facility] in [place]. Pt has appt on 9/23

## 2023-09-11 ENCOUNTER — Encounter: Payer: Self-pay | Admitting: Family Medicine

## 2023-09-11 ENCOUNTER — Ambulatory Visit (INDEPENDENT_AMBULATORY_CARE_PROVIDER_SITE_OTHER): Payer: PPO | Admitting: Family Medicine

## 2023-09-11 VITALS — BP 109/78 | HR 71 | Ht 67.5 in | Wt 202.6 lb

## 2023-09-11 DIAGNOSIS — Z79899 Other long term (current) drug therapy: Secondary | ICD-10-CM

## 2023-09-11 DIAGNOSIS — F5101 Primary insomnia: Secondary | ICD-10-CM | POA: Diagnosis not present

## 2023-09-11 DIAGNOSIS — Z794 Long term (current) use of insulin: Secondary | ICD-10-CM | POA: Diagnosis not present

## 2023-09-11 DIAGNOSIS — E7800 Pure hypercholesterolemia, unspecified: Secondary | ICD-10-CM | POA: Diagnosis not present

## 2023-09-11 DIAGNOSIS — I10 Essential (primary) hypertension: Secondary | ICD-10-CM

## 2023-09-11 DIAGNOSIS — E119 Type 2 diabetes mellitus without complications: Secondary | ICD-10-CM | POA: Diagnosis not present

## 2023-09-11 DIAGNOSIS — Z23 Encounter for immunization: Secondary | ICD-10-CM

## 2023-09-11 DIAGNOSIS — Z0000 Encounter for general adult medical examination without abnormal findings: Secondary | ICD-10-CM

## 2023-09-11 LAB — POCT GLYCOSYLATED HEMOGLOBIN (HGB A1C)
HbA1c POC (<> result, manual entry): 7.9 % (ref 4.0–5.6)
HbA1c, POC (controlled diabetic range): 7.9 % — AB (ref 0.0–7.0)
HbA1c, POC (prediabetic range): 7.9 % — AB (ref 5.7–6.4)
Hemoglobin A1C: 7.9 % — AB (ref 4.0–5.6)

## 2023-09-11 MED ORDER — PANTOPRAZOLE SODIUM 40 MG PO TBEC: DELAYED_RELEASE_TABLET | ORAL | 1 refills | Status: DC

## 2023-09-11 MED ORDER — ATORVASTATIN CALCIUM 80 MG PO TABS: ORAL_TABLET | ORAL | 1 refills | Status: DC

## 2023-09-11 MED ORDER — ALPRAZOLAM 1 MG PO TABS: ORAL_TABLET | ORAL | 1 refills | Status: DC

## 2023-09-11 MED ORDER — METFORMIN HCL 1000 MG PO TABS: ORAL_TABLET | ORAL | 1 refills | Status: DC

## 2023-09-11 MED ORDER — EZETIMIBE 10 MG PO TABS: 10.0000 mg | ORAL_TABLET | Freq: Every day | ORAL | 1 refills | Status: DC

## 2023-09-11 NOTE — Progress Notes (Signed)
Office Note 09/11/2023  CC:  Chief Complaint  Patient presents with   Annual Exam   Patient is a 72 y.o. male who is here for annual health maintenance exam and 18-month follow-up diabetes, hypertension, insomnia.  A/P as of last visit: "#1 right triceps focal inflammatory reaction versus hair follicle infection.  This is resolving.  Seems to have been triggered by his continuous glucose monitor sensor.  He has the sensor on the inner aspect of his left arm now and is not causing any kind of reaction. Reassured.  Continue over-the-counter antibacterial ointment.   2.  Hyperglycemia/poor diabetes control.  Seems clearly connected to taking his first injection of Repatha. He does not want to take any further of this type of medication. He is agreeable to continuing atorvastatin 80 mg a day, Zetia 10 mg a day, and fenofibrate 145 mg a day."  INTERIM HX: He feels good.  Alprazolam helpful for sleep but only if he took 1-1/2-2 of the 1 mg tabs. No adverse side effects.  Says home glucoses in the 150s to 250s range.  He does not give insulin with 100% compliance.  He gives himself 15 units twice a day usually but sometimes will give 20 when it is high in the evening.  However, he says when he does this he often has to get up in the middle the night and eat something because it drops too low. No burning, tingling, or numbness in feet. He takes his metformin 1000 mg twice a day.  He is taking atorvastatin 80 mg a day but he is not taking his Zetia. He cannot recall any side effects from Zetia and he does not really know why he is not taking it anymore. Repatha caused hyperglycemia so he discontinued it after the first dose.  He has waxing and waning chronic shoulder pain. He says he got cupping done on them and this helped more than anything.   PMP AWARE reviewed today: most recent rx for alprazolam was filled 07/01/2023, # 90, rx by me. No red flags.   Past Medical History:   Diagnosis Date   Adenomatous colon polyp 04/2006; 2018   2007 Dr. Virginia Rochester; 2018 Dr. Misty Stanley 04/2022.   Chronic renal insufficiency, stage II (mild) 2017   CrCl 60s   Diabetes mellitus    Diverticulosis of colon    hospitalized for diverticulitis in remote past; also had episode 08/27/14   Fatty liver 2015   Noted on noncontrast CT done for eval for kidney stones   History of ETT    LOW RISK/NEG ETT 02/2011 w/Dr. Deborah Chalk   History of hemorrhoids    Hyperlipidemia    Crestor caused leg cramps   Hypertension    Left foot pain    medial: post tibial tendonitis---podiatrist did sheath injection 03/2018 and 02/2019   Nephrolithiasis    Saw urologist in DISTANT past (?[facility]). Left mid ureteral stone 01/2016--ED visit.   Prostate nodule 08/2018   Referred to urol (PSA was normal/unchanged from prev year).   Renal cyst, left    hemorrhagic vs proteinacious (last f/u CT 2007 showed stability)--Dr. Virginia Rochester.   Shoulder pain, right     Injection on 2 consec mo at [facility] (Dr. August Saucer) 2012.  [facility] jt inj 12/2022 ortho    Past Surgical History:  Procedure Laterality Date   CHALAZION EXCISION  summer 2015   COLONOSCOPY W/ POLYPECTOMY  04/2006; 04/2017   2007: Adenomatous polyps x 2; recall 5 yrs (Dr. Virginia Rochester).  Repeat  04/2017 (Dr. Lorretta Harp adenoma x1 + sigmoid diverticulosis).  Recall 5 yrs.   ESOPHAGOGASTRODUODENOSCOPY  2008   Esophagitis/gastritis/GERD.  H pylori and Barrett's NEG.   HEMORRHOID SURGERY     NOSE SURGERY     deviated septum    Family History  Problem Relation Age of Onset   Diabetes Mother    Hypertension Mother    Coronary artery disease Mother    Diabetes Father    Hypertension Father    Coronary artery disease Father    Lymphoma Father        non-hodgkin   Obesity Daughter    Cancer Paternal Grandmother        unknown    Social History   Socioeconomic History   Marital status: Married    Spouse name: Not on file   Number of children: Not on file   Years  of education: Not on file   Highest education level: 12th grade  Occupational History   Not on file  Tobacco Use   Smoking status: Never   Smokeless tobacco: Never  Vaping Use   Vaping status: Never Used  Substance and Sexual Activity   Alcohol use: No   Drug use: No   Sexual activity: Yes    Partners: Female  Other Topics Concern   Not on file  Social History Narrative   Married, 1 son and 1 daughter.   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   No T/A/Ds.   Social Determinants of Health   Financial Resource Strain: Low Risk  (04/19/2023)   Overall Financial Resource Strain (CARDIA)    Difficulty of Paying Living Expenses: Not hard at all  Food Insecurity: No Food Insecurity (04/19/2023)   Hunger Vital Sign    Worried About Running Out of Food in the Last Year: Never true    Ran Out of Food in the Last Year: Never true  Transportation Needs: No Transportation Needs (04/19/2023)   PRAPARE - Administrator, Civil Service (Medical): No    Lack of Transportation (Non-Medical): No  Physical Activity: Inactive (04/19/2023)   Exercise Vital Sign    Days of Exercise per Week: 3 days    Minutes of Exercise per Session: 0 min  Stress: Stress Concern Present (04/19/2023)   [employer] of Occupational Health - Occupational Stress Questionnaire    Feeling of Stress : To some extent  Social Connections: Socially Integrated (04/19/2023)   Social Connection and Isolation Panel [NHANES]    Frequency of Communication with Friends and Family: More than three times a week    Frequency of Social Gatherings with Friends and Family: More than three times a week    Attends Religious Services: More than 4 times per year    Active Member of [employer] or Organizations: Yes    Attends Banker Meetings: More than 4 times per year    Marital Status: Married  Catering manager Violence: Not At Risk (04/19/2023)   Humiliation, Afraid, Rape,  and Kick questionnaire    Fear of Current or Ex-Partner: No    Emotionally Abused: No    Physically Abused: No    Sexually Abused: No    Outpatient Medications Prior to Visit  Medication Sig Dispense Refill   Continuous Glucose Sensor (FREESTYLE LIBRE 3 SENSOR) MISC PLACE 1 SENSOR ONTO THE SKIN EVERY 14 DAYS TO CHECK BLOOD SUGAR CONTINOUSLY 3 each 2   fenofibrate (TRICOR) 145 MG tablet TAKE 1 TABLET(145 MG) BY MOUTH  DAILY 90 tablet 1   hydrochlorothiazide (HYDRODIURIL) 25 MG tablet TAKE 1 TABLET(25 MG) BY MOUTH DAILY. OFFICE VISIT NEEDED FOR FURTHER REFILLS 90 tablet 1   Insulin Pen Needle (PEN NEEDLES) 31G X 5 MM MISC 1 each by Does not apply route daily. 100 each 5   TRESIBA FLEXTOUCH 100 UNIT/ML FlexTouch Pen ADMINISTER 15 UNITS UNDER THE SKIN TWICE DAILY 15 mL 1   ALPRAZolam (XANAX) 1 MG tablet Take 1 tablet (1 mg total) by mouth at bedtime. 90 tablet 1   atorvastatin (LIPITOR) 80 MG tablet TAKE 1 TABLET(80 MG) BY MOUTH DAILY. OFFICE VISIT NEEDED FOR FURTHER REFILLS 30 tablet 0   Evolocumab (REPATHA SURECLICK) 140 MG/ML SOAJ Inject 140 mg into the skin every 14 (fourteen) days. (Patient not taking: Reported on 09/11/2023) 6 mL 3   ezetimibe (ZETIA) 10 MG tablet Take 1 tablet (10 mg total) by mouth daily. OFFICE VISIT NEEDED FOR FURTHER REFILLS 30 tablet 0   metFORMIN (GLUCOPHAGE) 1000 MG tablet TAKE 1 TABLET(1000 MG) BY MOUTH TWICE DAILY. OFFICE VISIT NEEDED FOR FURTHER REFILLS 60 tablet 0   pantoprazole (PROTONIX) 40 MG tablet TAKE 1 TABLET(40 MG) BY MOUTH DAILY. OFFICE VISIT NEEDED FOR FURTHER REFILLS 30 tablet 0   No facility-administered medications prior to visit.    Allergies  Allergen Reactions   Repatha [Evolocumab] Other (See Comments)    hyperglycemia    Review of Systems  Constitutional:  Negative for appetite change, chills, fatigue and fever.  HENT:  Negative for congestion, dental problem, ear pain and sore throat.   Eyes:  Negative for discharge, redness and visual  disturbance.  Respiratory:  Negative for cough, chest tightness, shortness of breath and wheezing.   Cardiovascular:  Negative for chest pain, palpitations and leg swelling.  Gastrointestinal:  Negative for abdominal pain, blood in stool, diarrhea, nausea and vomiting.  Genitourinary:  Negative for difficulty urinating, dysuria, flank pain, frequency, hematuria and urgency.  Musculoskeletal:  Positive for arthralgias (shoulders). Negative for back pain, joint swelling, myalgias and neck stiffness.  Skin:  Negative for pallor and rash.  Neurological:  Negative for dizziness, speech difficulty, weakness and headaches.  Hematological:  Negative for adenopathy. Does not bruise/bleed easily.  Psychiatric/Behavioral:  Negative for confusion and sleep disturbance. The patient is not nervous/anxious.     PE;    09/11/2023    2:41 PM 04/20/2023   11:11 AM 04/19/2023    9:43 AM  Vitals with BMI  Height 5' 7.5"  5\' 9"   Weight 202 lbs 10 oz 204 lbs 204 lbs 10 oz  BMI 31.25 30.11 30.2  Systolic 109 138 295  Diastolic 78 84 78  Pulse 71 80 71   Gen: Alert, well appearing.  Patient is oriented to person, place, time, and situation. AFFECT: pleasant, lucid thought and speech. ENT: Ears: EACs clear, normal epithelium.  TMs with good light reflex and landmarks bilaterally.  Eyes: no injection, icteris, swelling, or exudate.  EOMI, PERRLA. Nose: no drainage or turbinate edema/swelling.  No injection or focal lesion.  Mouth: lips without lesion/swelling.  Oral mucosa pink and moist.  Dentition intact and without obvious caries or gingival swelling.  Oropharynx without erythema, exudate, or swelling.  Neck: supple/nontender.  No LAD, mass, or TM.  Carotid pulses 2+ bilaterally, without bruits. CV: RRR, no m/r/g.   LUNGS: CTA bilat, nonlabored resps, good aeration in all lung fields. ABD: soft, NT, ND, BS normal.  No hepatospenomegaly or mass.  No bruits. EXT: no clubbing, cyanosis, or  edema.   Musculoskeletal: no joint swelling, erythema, warmth, or tenderness.  ROM of all joints intact. Skin - no sores or suspicious lesions or rashes or color changes  Pertinent labs:  Lab Results  Component Value Date   TSH 1.22 08/28/2018   Lab Results  Component Value Date   WBC 10.7 (H) 08/24/2021   HGB 17.0 08/24/2021   HCT 48.7 08/24/2021   MCV 88.9 08/24/2021   PLT 248 08/24/2021   Lab Results  Component Value Date   CREATININE 1.28 12/28/2022   BUN 21 12/28/2022   NA 138 12/28/2022   K 4.1 12/28/2022   CL 97 12/28/2022   CO2 31 12/28/2022   Lab Results  Component Value Date   ALT 29 12/28/2022   AST 31 12/28/2022   ALKPHOS 26 (L) 12/28/2022   BILITOT 0.9 12/28/2022   Lab Results  Component Value Date   CHOL 202 (H) 12/28/2022   Lab Results  Component Value Date   HDL 39.00 (L) 12/28/2022   Lab Results  Component Value Date   LDLCALC 124 (H) 12/28/2022   Lab Results  Component Value Date   TRIG 196.0 (H) 12/28/2022   Lab Results  Component Value Date   CHOLHDL 5 12/28/2022   Lab Results  Component Value Date   PSA 0.54 12/28/2022   PSA 0.66 12/30/2021   PSA 0.45 12/31/2020   Lab Results  Component Value Date   HGBA1C 7.8 (A) 04/05/2023   HGBA1C 7.8 04/05/2023   HGBA1C 7.8 (A) 04/05/2023   HGBA1C 7.8 (A) 04/05/2023   ASSESSMENT AND PLAN:   #1 health maintenance exam: Reviewed age and gender appropriate health maintenance issues (prudent diet, regular exercise, health risks of tobacco and excessive alcohol, use of seatbelts, fire alarms in home, use of sunscreen).  Also reviewed age and gender appropriate health screening as well as vaccine recommendations. Vaccines: shingrix-> declined.  Tdap--> declined.  Flu vaccine given today. Labs: CBC with differential, c-Met, lipid panel (not fasting). Prostate ca screening: Next PSA due after January 2025. Colon ca screening: History of adenomatous polyps.  Recall due as of 2023.  #2 diabetes without  complication. Hemoglobin A1c 7.9% today. He prefers to take his Lantus and 2 divided doses. Increase morning Lantus to 20 units and continue evening at 15 units. Continue metformin 1000 mg twice a day.  3.  Hypertension, well-controlled on HCTZ 25 mg a day. Electrolytes and creatinine monitoring today.  #4 hypercholesterolemia. Repatha resulted in significant hyperglycemia--he does not want to take any further of this type of medication. He is agreeable to continuing atorvastatin 80 mg a day and fenofibrate 145 every day, and he will restart zetia 10mg  every day. Non-fasting lipid panel today, hepatic panel today.  #5 primary insomnia. Alprazolam 1 mg, 1.5 to 2 tabs nightly as needed, #180, RF x 1.  An After Visit Summary was printed and given to the patient.  FOLLOW UP:  Return in about 4 months (around 01/11/2024) for routine chronic illness f/u.  Signed:  Santiago Bumpers, MD           09/11/2023

## 2023-09-12 LAB — LIPID PANEL
Cholesterol: 206 mg/dL — ABNORMAL HIGH (ref 0–200)
HDL: 36.8 mg/dL — ABNORMAL LOW (ref >39.00)
LDL Cholesterol: 125 mg/dL — ABNORMAL HIGH (ref 0–99)
NonHDL: 169.58
Total CHOL/HDL Ratio: 6
Triglycerides: 223 mg/dL — ABNORMAL HIGH (ref 0.0–149.0)
VLDL: 44.6 mg/dL — ABNORMAL HIGH (ref 0.0–40.0)

## 2023-09-12 LAB — CBC WITH DIFFERENTIAL/PLATELET
Basophils Absolute: 0.1 10*3/uL (ref 0.0–0.1)
Basophils Relative: 1.1 % (ref 0.0–3.0)
Eosinophils Absolute: 0.1 10*3/uL (ref 0.0–0.7)
Eosinophils Relative: 2.5 % (ref 0.0–5.0)
HCT: 45.1 % (ref 39.0–52.0)
Hemoglobin: 14.6 g/dL (ref 13.0–17.0)
Lymphocytes Relative: 20.9 % (ref 12.0–46.0)
Lymphs Abs: 1.1 10*3/uL (ref 0.7–4.0)
MCHC: 32.4 g/dL (ref 30.0–36.0)
MCV: 93 fl (ref 78.0–100.0)
Monocytes Absolute: 0.3 10*3/uL (ref 0.1–1.0)
Monocytes Relative: 6.3 % (ref 3.0–12.0)
Neutro Abs: 3.8 10*3/uL (ref 1.4–7.7)
Neutrophils Relative %: 69.2 % (ref 43.0–77.0)
Platelets: 181 10*3/uL (ref 150.0–400.0)
RBC: 4.85 Mil/uL (ref 4.22–5.81)
RDW: 13.5 % (ref 11.5–15.5)
WBC: 5.4 10*3/uL (ref 4.0–10.5)

## 2023-09-12 LAB — COMPREHENSIVE METABOLIC PANEL
ALT: 20 U/L (ref 0–53)
AST: 25 U/L (ref 0–37)
Albumin: 4.3 g/dL (ref 3.5–5.2)
Alkaline Phosphatase: 25 U/L — ABNORMAL LOW (ref 39–117)
BUN: 25 mg/dL — ABNORMAL HIGH (ref 6–23)
CO2: 32 mEq/L (ref 19–32)
Calcium: 9.4 mg/dL (ref 8.4–10.5)
Chloride: 97 mEq/L (ref 96–112)
Creatinine, Ser: 1.4 mg/dL (ref 0.40–1.50)
GFR: 50.34 mL/min — ABNORMAL LOW (ref >60.00)
Glucose, Bld: 178 mg/dL — ABNORMAL HIGH (ref 70–99)
Potassium: 4 mEq/L (ref 3.5–5.1)
Sodium: 138 mEq/L (ref 135–145)
Total Bilirubin: 0.6 mg/dL (ref 0.2–1.2)
Total Protein: 6.7 g/dL (ref 6.0–8.3)

## 2023-10-13 ENCOUNTER — Other Ambulatory Visit (HOSPITAL_COMMUNITY): Payer: Self-pay

## 2023-10-26 ENCOUNTER — Telehealth: Payer: Self-pay | Admitting: Family Medicine

## 2023-10-26 MED ORDER — FREESTYLE LIBRE 3 SENSOR MISC: 2 refills | Status: DC

## 2023-10-26 NOTE — Telephone Encounter (Signed)
Prescription Request  10/26/2023  LOV: 09/11/2023  What is the name of the medication or equipment? Continuous Glucose Sensor (FREESTYLE LIBRE 3 SENSOR) MISC Correct pharmacy is [facility] in [place]   Have you contacted your pharmacy to request a refill? Yes   Which pharmacy would you like this sent to?  [facility][id number] - [place], [place] - [address] [place] [address] Phone: [phone] Fax: [phone]  [id number] - [place], [place] - [address] AT [facility] [address] [place] [address] Phone: [phone] Fax: [phone]  [facility] - [place], [place] - [address] [place] [address] Phone: [phone] Fax: [phone]  [facility] [address] [place] - [address] [place] [address] Phone: [phone] Fax: [phone]    Patient notified that their request is being sent to the clinical staff for review and that they should receive a response within 2 business days.   Please advise at Mobile [phone] (mobile)

## 2023-10-26 NOTE — Telephone Encounter (Signed)
Refill sent to [facility].  

## 2023-11-04 ENCOUNTER — Other Ambulatory Visit: Payer: Self-pay | Admitting: Family Medicine

## 2023-11-06 NOTE — Telephone Encounter (Signed)
Last office note shows that patient should be taking Lantus 20 units in the morning and 15 in the evening. Patient is taking [place]. Should Evaristo Bury be 20 units in the morning and 15 units in the afternoon? If so, directions will need to be changed to how patient should be taking medication.

## 2024-01-05 ENCOUNTER — Other Ambulatory Visit: Payer: Self-pay | Admitting: Family Medicine

## 2024-01-11 ENCOUNTER — Encounter: Payer: Self-pay | Admitting: Family Medicine

## 2024-01-11 ENCOUNTER — Ambulatory Visit: Payer: PPO | Admitting: Family Medicine

## 2024-01-11 VITALS — BP 130/86 | HR 65 | Wt 209.4 lb

## 2024-01-11 DIAGNOSIS — E7800 Pure hypercholesterolemia, unspecified: Secondary | ICD-10-CM | POA: Diagnosis not present

## 2024-01-11 DIAGNOSIS — E119 Type 2 diabetes mellitus without complications: Secondary | ICD-10-CM

## 2024-01-11 DIAGNOSIS — Z125 Encounter for screening for malignant neoplasm of prostate: Secondary | ICD-10-CM

## 2024-01-11 DIAGNOSIS — I10 Essential (primary) hypertension: Secondary | ICD-10-CM | POA: Diagnosis not present

## 2024-01-11 DIAGNOSIS — Z794 Long term (current) use of insulin: Secondary | ICD-10-CM | POA: Diagnosis not present

## 2024-01-11 LAB — COMPREHENSIVE METABOLIC PANEL
ALT: 33 U/L (ref 0–53)
AST: 31 U/L (ref 0–37)
Albumin: 4.8 g/dL (ref 3.5–5.2)
Alkaline Phosphatase: 28 U/L — ABNORMAL LOW (ref 39–117)
BUN: 24 mg/dL — ABNORMAL HIGH (ref 6–23)
CO2: 34 meq/L — ABNORMAL HIGH (ref 19–32)
Calcium: 10.4 mg/dL (ref 8.4–10.5)
Chloride: 98 meq/L (ref 96–112)
Creatinine, Ser: 1.27 mg/dL (ref 0.40–1.50)
GFR: 56.45 mL/min — ABNORMAL LOW (ref >60.00)
Glucose, Bld: 96 mg/dL (ref 70–99)
Potassium: 4.3 meq/L (ref 3.5–5.1)
Sodium: 140 meq/L (ref 135–145)
Total Bilirubin: 0.8 mg/dL (ref 0.2–1.2)
Total Protein: 7.3 g/dL (ref 6.0–8.3)

## 2024-01-11 LAB — LIPID PANEL
Cholesterol: 160 mg/dL (ref 0–200)
HDL: 40.9 mg/dL (ref >39.00)
LDL Cholesterol: 86 mg/dL (ref 0–99)
NonHDL: 119.36
Total CHOL/HDL Ratio: 4
Triglycerides: 168 mg/dL — ABNORMAL HIGH (ref 0.0–149.0)
VLDL: 33.6 mg/dL (ref 0.0–40.0)

## 2024-01-11 LAB — POCT GLYCOSYLATED HEMOGLOBIN (HGB A1C)
HbA1c POC (<> result, manual entry): 8 % (ref 4.0–5.6)
HbA1c, POC (controlled diabetic range): 8 % — AB (ref 0.0–7.0)
HbA1c, POC (prediabetic range): 8 % — AB (ref 5.7–6.4)
Hemoglobin A1C: 8 % — AB (ref 4.0–5.6)

## 2024-01-11 LAB — MICROALBUMIN / CREATININE URINE RATIO
Creatinine,U: 71.6 mg/dL
Microalb Creat Ratio: 1 mg/g (ref 0.0–30.0)
Microalb, Ur: <0.7 mg/dL (ref 0.0–1.9)

## 2024-01-11 LAB — PSA, MEDICARE: PSA: 0.6 ng/mL (ref 0.10–4.00)

## 2024-01-11 MED ORDER — HYDROCHLOROTHIAZIDE 25 MG PO TABS: ORAL_TABLET | ORAL | 3 refills | Status: DC

## 2024-01-11 MED ORDER — TIRZEPATIDE 2.5 MG/0.5ML ~~LOC~~ SOAJ: 2.5000 mg | SUBCUTANEOUS | 0 refills | Status: DC

## 2024-01-11 MED ORDER — METFORMIN HCL 1000 MG PO TABS: ORAL_TABLET | ORAL | 3 refills | Status: AC

## 2024-01-11 MED ORDER — ALPRAZOLAM 1 MG PO TABS: ORAL_TABLET | ORAL | 1 refills | Status: DC

## 2024-01-11 MED ORDER — ATORVASTATIN CALCIUM 80 MG PO TABS: ORAL_TABLET | ORAL | 3 refills | Status: DC

## 2024-01-11 MED ORDER — PANTOPRAZOLE SODIUM 40 MG PO TBEC: DELAYED_RELEASE_TABLET | ORAL | 3 refills | Status: DC

## 2024-01-11 NOTE — Progress Notes (Signed)
OFFICE VISIT  01/11/2024  CC:  Chief Complaint  Patient presents with   Diabetes    Pt is fasting.     Patient is a 73 y.o. male who presents for 51-month follow-up diabetes, hypertension, insomnia, and hypercholesterolemia. A/P as of last visit: "1 diabetes without complication. Hemoglobin A1c 7.9% today. He prefers to take his Lantus and 2 divided doses. Increase morning Lantus to 20 units and continue evening at 15 units. Continue metformin 1000 mg twice a day.   2.  Hypertension, well-controlled on HCTZ 25 mg a day. Electrolytes and creatinine monitoring today.   #3 hypercholesterolemia. Repatha resulted in significant hyperglycemia--he does not want to take any further of this type of medication. He is agreeable to continuing atorvastatin 80 mg a day and fenofibrate 145 every day, and he will restart zetia 10mg  every day. Non-fasting lipid panel today, hepatic panel today.   #4 primary insomnia. Alprazolam 1 mg, 1.5 to 2 tabs nightly as needed, #180, RF x 1."  INTERIM HX: Feeling well. He went off of his insulin about 3 weeks ago.  He has been taking his metformin 1000 mg twice a day, though. He has changed some things with his diet.  Except for some occasional excursions over 300 when he eats the wrong food, he says his glucoses have ranged in the 100-165 range.  No home blood pressure monitoring.  Using alprazolam to help sleep, doing well. PMP AWARE reviewed today: most recent rx for alprazolam was filled 09/11/2023, # 180, rx by me. No red flags.  ROS as above, plus--> no fevers, no CP, no SOB, no wheezing, no cough, no dizziness, no HAs, no rashes, no melena/hematochezia.  No polyuria or polydipsia.  No myalgias or arthralgias.  No focal weakness, paresthesias, or tremors.  No acute vision or hearing abnormalities.  No dysuria or unusual/new urinary urgency or frequency.  No recent changes in lower legs. No n/v/d or abd pain.  No palpitations.    Past Medical  History:  Diagnosis Date   Adenomatous colon polyp 04/2006; 2018   2007 Dr. Virginia Rochester; 2018 Dr. Misty Stanley 04/2022.   Chronic renal insufficiency, stage II (mild) 2017   CrCl 60s   Diabetes mellitus    Diverticulosis of colon    hospitalized for diverticulitis in remote past; also had episode 08/27/14   Fatty liver 2015   Noted on noncontrast CT done for eval for kidney stones   History of ETT    LOW RISK/NEG ETT 02/2011 w/Dr. Deborah Chalk   History of hemorrhoids    Hyperlipidemia    Crestor caused leg cramps   Hypertension    Left foot pain    medial: post tibial tendonitis---podiatrist did sheath injection 03/2018 and 02/2019   Nephrolithiasis    Saw urologist in DISTANT past ([facility]). Left mid ureteral stone 01/2016--ED visit.   Prostate nodule 08/2018   Referred to urol (PSA was normal/unchanged from prev year).   Renal cyst, left    hemorrhagic vs proteinacious (last f/u CT 2007 showed stability)--Dr. Virginia Rochester.   Shoulder pain, right     Injection on 2 consec mo at [facility] (Dr. August Saucer) 2012.  [facility] jt inj 12/2022 ortho    Past Surgical History:  Procedure Laterality Date   CHALAZION EXCISION  summer 2015   COLONOSCOPY W/ POLYPECTOMY  04/2006; 04/2017   2007: Adenomatous polyps x 2; recall 5 yrs (Dr. Virginia Rochester).  Repeat 04/2017 (Dr. Lorretta Harp adenoma x1 + sigmoid diverticulosis).  Recall 5 yrs.   ESOPHAGOGASTRODUODENOSCOPY  2008   Esophagitis/gastritis/GERD.  H pylori and Barrett's NEG.   HEMORRHOID SURGERY     NOSE SURGERY     deviated septum    Outpatient Medications Prior to Visit  Medication Sig Dispense Refill   Continuous Glucose Sensor (FREESTYLE LIBRE 3 SENSOR) MISC PLACE 1 SENSOR ONTO THE SKIN EVERY 14 DAYS TO CHECK BLOOD SUGAR CONTINOUSLY 3 each 2   ezetimibe (ZETIA) 10 MG tablet Take 1 tablet (10 mg total) by mouth daily. 90 tablet 1   fenofibrate (TRICOR) 145 MG tablet TAKE 1 TABLET(145 MG) BY MOUTH DAILY 90 tablet 0   Insulin Pen Needle (PEN NEEDLES) 31G X 5 MM MISC 1  each by Does not apply route daily. 100 each 5   TRESIBA FLEXTOUCH 100 UNIT/ML FlexTouch Pen 20 U qAM and 15 U qPM 15 mL 1   ALPRAZolam (XANAX) 1 MG tablet 1-2 tabs po at bedtime insomnia 180 tablet 1   atorvastatin (LIPITOR) 80 MG tablet TAKE 1 TABLET(80 MG) BY MOUTH DAILY. OFFICE VISIT NEEDED FOR FURTHER REFILLS 90 tablet 1   hydrochlorothiazide (HYDRODIURIL) 25 MG tablet TAKE 1 TABLET(25 MG) BY MOUTH DAILY. OFFICE VISIT NEEDED FOR FURTHER REFILLS 90 tablet 1   metFORMIN (GLUCOPHAGE) 1000 MG tablet TAKE 1 TABLET(1000 MG) BY MOUTH TWICE DAILY 180 tablet 1   pantoprazole (PROTONIX) 40 MG tablet TAKE 1 TABLET(40 MG) BY MOUTH DAILY 90 tablet 1   No facility-administered medications prior to visit.    Allergies  Allergen Reactions   Repatha [Evolocumab] Other (See Comments)    hyperglycemia    Review of Systems As per HPI  PE:    01/11/2024    8:03 AM 09/11/2023    2:41 PM 04/20/2023   11:11 AM  Vitals with BMI  Height  5' 7.5"   Weight 209 lbs 6 oz 202 lbs 10 oz 204 lbs  BMI  31.25 30.11  Systolic 145 109 811  Diastolic 93 78 84  Pulse 65 71 80     Physical Exam  Gen: Alert, well appearing.  Patient is oriented to person, place, time, and situation. AFFECT: pleasant, lucid thought and speech. No further exam today  LABS:  Last CBC Lab Results  Component Value Date   WBC 5.4 09/11/2023   HGB 14.6 09/11/2023   HCT 45.1 09/11/2023   MCV 93.0 09/11/2023   MCH 31.0 08/24/2021   RDW 13.5 09/11/2023   PLT 181.0 09/11/2023   Last metabolic panel Lab Results  Component Value Date   GLUCOSE 178 (H) 09/11/2023   NA 138 09/11/2023   K 4.0 09/11/2023   CL 97 09/11/2023   CO2 32 09/11/2023   BUN 25 (H) 09/11/2023   CREATININE 1.40 09/11/2023   GFR 50.34 (L) 09/11/2023   CALCIUM 9.4 09/11/2023   PROT 6.7 09/11/2023   ALBUMIN 4.3 09/11/2023   BILITOT 0.6 09/11/2023   ALKPHOS 25 (L) 09/11/2023   AST 25 09/11/2023   ALT 20 09/11/2023   ANIONGAP 10 08/24/2021   Last  lipids Lab Results  Component Value Date   CHOL 206 (H) 09/11/2023   HDL 36.80 (L) 09/11/2023   LDLCALC 125 (H) 09/11/2023   LDLDIRECT 127.0 12/31/2020   TRIG 223.0 (H) 09/11/2023   CHOLHDL 6 09/11/2023   Last hemoglobin A1c Lab Results  Component Value Date   HGBA1C 7.9 (A) 09/11/2023   HGBA1C 7.9 09/11/2023   HGBA1C 7.9 (A) 09/11/2023   HGBA1C 7.9 (A) 09/11/2023   Last thyroid functions Lab Results  Component  Value Date   TSH 1.22 08/28/2018   Lab Results  Component Value Date   PSA 0.54 12/28/2022   PSA 0.66 12/30/2021   PSA 0.45 12/31/2020   IMPRESSION AND PLAN:  1 diabetes without complication. Hemoglobin A1c 8.3%% today. He wants to see if insurance will cover the [facility]: 2.5 mg weekly ordered today. Therapeutic expectations and side effect profile of medication discussed today.  Patient's questions answered. If Greggory Keen is not covered I advised him to restart his insulin: Received a 20 units every morning and 15 units every afternoon (he prefers to divide the dose into 2 shots). Continue metformin 1000 mg twice a day. Urine microalbumin/creatinine today.  2.  Hypertension, well-controlled on HCTZ 25 mg a day. Electrolytes and creatinine monitoring today.   #3 hypercholesterolemia. Repatha resulted in significant hyperglycemia--he does not want to take any further of this type of medication. Continue atorvastatin 80 mg a day and fenofibrate 145 every day, and he will restart zetia 10mg  every day. Fasting lipid panel today.   #4 primary insomnia. Alprazolam 1 mg, 1.5 to 2 tabs nightly as needed.  An After Visit Summary was printed and given to the patient.  FOLLOW UP: Return in about 4 weeks (around 02/08/2024) for f/u mounjaro. Next CPE 08/2024 Signed:  Santiago Bumpers, MD           01/11/2024

## 2024-01-12 ENCOUNTER — Encounter: Payer: Self-pay | Admitting: Family Medicine

## 2024-01-15 ENCOUNTER — Telehealth: Payer: Self-pay | Admitting: Pharmacist

## 2024-01-15 NOTE — Telephone Encounter (Signed)
Pharmacy Patient Advocate Encounter   Received notification from Physician's Office that prior authorization for Mounjaro 2.5MG /0.5ML auto-injectors is required/requested.   Insurance verification completed.   The patient is insured through [facility] ADVANTAGE/RX ADVANCE .   Per test claim: PA required; PA submitted to above mentioned insurance via CoverMyMeds Key/confirmation #/EOC [id number] Status is pending

## 2024-01-16 ENCOUNTER — Other Ambulatory Visit (HOSPITAL_COMMUNITY): Payer: Self-pay

## 2024-01-16 NOTE — Telephone Encounter (Signed)
Pharmacy Patient Advocate Encounter  Received notification from [facility] ADVANTAGE/RX ADVANCE that Prior Authorization for [facility] 2.5MG /0.5ML auto-injectors has been APPROVED from 01/15/2024 to 01/14/2025. Ran test claim, Copay is $0.00. This test claim was processed through [facility]- copay amounts may vary at other pharmacies due to pharmacy/plan contracts, or as the patient moves through the different stages of their insurance plan.   PA Case ID #: [id number]

## 2024-01-16 NOTE — Telephone Encounter (Signed)
Pt advised of PA approval

## 2024-01-20 ENCOUNTER — Other Ambulatory Visit: Payer: Self-pay | Admitting: Family Medicine

## 2024-02-07 ENCOUNTER — Ambulatory Visit: Payer: PPO | Admitting: Family Medicine

## 2024-02-08 NOTE — Patient Instructions (Signed)

## 2024-02-09 ENCOUNTER — Encounter: Payer: Self-pay | Admitting: Family Medicine

## 2024-02-09 ENCOUNTER — Ambulatory Visit: Payer: PPO | Admitting: Family Medicine

## 2024-02-09 VITALS — BP 131/77 | HR 75 | Temp 98.2°F | Ht 67.5 in | Wt 208.2 lb

## 2024-02-09 DIAGNOSIS — Z794 Long term (current) use of insulin: Secondary | ICD-10-CM

## 2024-02-09 DIAGNOSIS — E119 Type 2 diabetes mellitus without complications: Secondary | ICD-10-CM

## 2024-02-09 NOTE — Progress Notes (Signed)
 OFFICE VISIT  02/09/2024  CC:  Chief Complaint  Patient presents with   [facility]    4 wk f/u Mounjaro 2.5mg  dose    Patient is a 73 y.o. male who presents for 1 month follow-up weight management. A/P as of last visit: "1 diabetes without complication. Hemoglobin A1c 8.3%% today. He wants to see if insurance will cover the [facility]: 2.5 mg weekly ordered today. Therapeutic expectations and side effect profile of medication discussed today.  Patient's questions answered. If Greggory Keen is not covered I advised him to restart his insulin: Received a 20 units every morning and 15 units every afternoon (he prefers to divide the dose into 2 shots). Continue metformin 1000 mg twice a day. Urine microalbumin/creatinine today.   2.  Hypertension, well-controlled on HCTZ 25 mg a day. Electrolytes and creatinine monitoring today.   #3 hypercholesterolemia. Repatha resulted in significant hyperglycemia--he does not want to take any further of this type of medication. Continue atorvastatin 80 mg a day and fenofibrate 145 every day, and he will restart zetia 10mg  every day. Fasting lipid panel today.   #4 primary insomnia. Alprazolam 1 mg, 1.5 to 2 tabs nightly as needed."  INTERIM HX: Labs all stable last visit.  Shaun Knight feels well. Unfortunately, he had bad reflux, nausea, and vomiting for a full day after Mounjaro 2.5 mg injection.  He has not taken it since that time. He says his sugars are actually pretty good, normal for the most part.  He is taking [place] 20 units in the morning and 15 units in the evening as well as metformin 1000 mg twice a day.  Past Medical History:  Diagnosis Date   Adenomatous colon polyp 04/2006; 2018   2007 Dr. Virginia Rochester; 2018 Dr. Misty Stanley 04/2022.   Chronic renal insufficiency, stage II (mild) 2017   CrCl 60s   Diabetes mellitus    Diverticulosis of colon    hospitalized for diverticulitis in remote past; also had episode 08/27/14   Fatty liver 2015    Noted on noncontrast CT done for eval for kidney stones   History of ETT    LOW RISK/NEG ETT 02/2011 w/Dr. Deborah Chalk   History of hemorrhoids    Hyperlipidemia    Crestor caused leg cramps   Hypertension    Left foot pain    medial: post tibial tendonitis---podiatrist did sheath injection 03/2018 and 02/2019   Nephrolithiasis    Saw urologist in DISTANT past ([facility]). Left mid ureteral stone 01/2016--ED visit.   Prostate nodule 08/2018   Referred to urol (PSA was normal/unchanged from prev year).   Renal cyst, left    hemorrhagic vs proteinacious (last f/u CT 2007 showed stability)--Dr. Virginia Rochester.   Shoulder pain, right     Injection on 2 consec mo at [facility] (Dr. August Saucer) 2012.  [facility] jt inj 12/2022 ortho    Past Surgical History:  Procedure Laterality Date   CHALAZION EXCISION  summer 2015   COLONOSCOPY W/ POLYPECTOMY  04/2006; 04/2017   2007: Adenomatous polyps x 2; recall 5 yrs (Dr. Virginia Rochester).  Repeat 04/2017 (Dr. Lorretta Harp adenoma x1 + sigmoid diverticulosis).  Recall 5 yrs.   ESOPHAGOGASTRODUODENOSCOPY  2008   Esophagitis/gastritis/GERD.  H pylori and Barrett's NEG.   HEMORRHOID SURGERY     NOSE SURGERY     deviated septum    Outpatient Medications Prior to Visit  Medication Sig Dispense Refill   ALPRAZolam (XANAX) 1 MG tablet 1-2 tabs po at bedtime insomnia 180 tablet 1   atorvastatin (LIPITOR)  80 MG tablet TAKE 1 TABLET(80 MG) BY MOUTH DAILY 90 tablet 3   Continuous Glucose Sensor (FREESTYLE LIBRE 3 SENSOR) MISC PLACE 1 SENSOR ONTO THE SKIN EVERY 14 DAYS TO CHECK BLOOD SUGAR CONTINOUSLY 3 each 2   ezetimibe (ZETIA) 10 MG tablet Take 1 tablet (10 mg total) by mouth daily. 90 tablet 1   fenofibrate (TRICOR) 145 MG tablet TAKE 1 TABLET(145 MG) BY MOUTH DAILY 90 tablet 0   hydrochlorothiazide (HYDRODIURIL) 25 MG tablet TAKE 1 TABLET(25 MG) BY MOUTH DAILY 90 tablet 3   Insulin Pen Needle (PEN NEEDLES) 31G X 5 MM MISC 1 each by Does not apply route daily. 100 each 5   metFORMIN  (GLUCOPHAGE) 1000 MG tablet TAKE 1 TABLET(1000 MG) BY MOUTH TWICE DAILY 180 tablet 3   pantoprazole (PROTONIX) 40 MG tablet TAKE 1 TABLET(40 MG) BY MOUTH DAILY 90 tablet 3   TRESIBA FLEXTOUCH 100 UNIT/ML FlexTouch Pen INJECT 20 UNITS INTO THE SKIN EVERY MORNING AND 15 UNITS EVERY EVENING 15 mL 1   tirzepatide (MOUNJARO) 2.5 MG/0.5ML Pen Inject 2.5 mg into the skin once a week. (Patient not taking: Reported on 02/09/2024) 2 mL 0   No facility-administered medications prior to visit.    Allergies  Allergen Reactions   Repatha [Evolocumab] Other (See Comments)    hyperglycemia    Review of Systems As per HPI  PE:    02/09/2024   10:26 AM 01/11/2024    8:26 AM 01/11/2024    8:03 AM  Vitals with BMI  Height 5' 7.5"    Weight 208 lbs 3 oz  209 lbs 6 oz  BMI 32.11    Systolic 131 130 829  Diastolic 77 86 93  Pulse 75  65     Physical Exam  Gen: Alert, well appearing.  Patient is oriented to person, place, time, and situation. AFFECT: pleasant, lucid thought and speech. No further exam today  LABS:  Last CBC Lab Results  Component Value Date   WBC 5.4 09/11/2023   HGB 14.6 09/11/2023   HCT 45.1 09/11/2023   MCV 93.0 09/11/2023   MCH 31.0 08/24/2021   RDW 13.5 09/11/2023   PLT 181.0 09/11/2023       Chemistry      Component Value Date/Time   NA 140 01/11/2024 0827   K 4.3 01/11/2024 0827   CL 98 01/11/2024 0827   CO2 34 (H) 01/11/2024 0827   BUN 24 (H) 01/11/2024 0827   CREATININE 1.27 01/11/2024 0827      Component Value Date/Time   CALCIUM 10.4 01/11/2024 0827   ALKPHOS 28 (L) 01/11/2024 0827   AST 31 01/11/2024 0827   ALT 33 01/11/2024 0827   BILITOT 0.8 01/11/2024 0827     Lab Results  Component Value Date   HGBA1C 8.0 (A) 01/11/2024   HGBA1C 8.0 01/11/2024   HGBA1C 8.0 (A) 01/11/2024   HGBA1C 8.0 (A) 01/11/2024   Lab Results  Component Value Date   CHOL 160 01/11/2024   HDL 40.90 01/11/2024   LDLCALC 86 01/11/2024   LDLDIRECT 127.0 12/31/2020    TRIG 168.0 (H) 01/11/2024   CHOLHDL 4 01/11/2024   IMPRESSION AND PLAN:  Diabetes without complication. Intolerant to [employer]. Continue Tresiba 20 units in the morning and 15 units in the evening.  Continue metformin 1000 mg twice a day.  An After Visit Summary was printed and given to the patient. Next CPE 08/2024 FOLLOW UP: Return in about 3 months (around 05/08/2024) for routine  chronic illness f/u.  Signed:  Santiago Bumpers, MD           02/09/2024

## 2024-02-26 ENCOUNTER — Encounter: Payer: Self-pay | Admitting: Urgent Care

## 2024-02-26 ENCOUNTER — Ambulatory Visit (INDEPENDENT_AMBULATORY_CARE_PROVIDER_SITE_OTHER): Admitting: Urgent Care

## 2024-02-26 VITALS — BP 132/85 | HR 74 | Temp 97.9°F | Wt 209.0 lb

## 2024-02-26 DIAGNOSIS — K5792 Diverticulitis of intestine, part unspecified, without perforation or abscess without bleeding: Secondary | ICD-10-CM

## 2024-02-26 DIAGNOSIS — R1030 Lower abdominal pain, unspecified: Secondary | ICD-10-CM

## 2024-02-26 LAB — POC URINALSYSI DIPSTICK (AUTOMATED)
Blood, UA: NEGATIVE
Glucose, UA: NEGATIVE
Ketones, UA: NEGATIVE
Leukocytes, UA: NEGATIVE
Nitrite, UA: NEGATIVE
Protein, UA: NEGATIVE
Spec Grav, UA: >=1.03 — AB (ref 1.010–1.025)
Urobilinogen, UA: 0.2 U/dL
pH, UA: 5 (ref 5.0–8.0)

## 2024-02-26 MED ORDER — AMOXICILLIN-POT CLAVULANATE 875-125 MG PO TABS: 1.0000 | ORAL_TABLET | Freq: Two times a day (BID) | ORAL | 0 refills | Status: AC

## 2024-02-26 NOTE — Progress Notes (Signed)
 Established Patient Office Visit  Subjective:  Patient ID: Shaun Knight, male    DOB: 02-11-1951  Age: 73 y.o. MRN: [id number]  Chief Complaint  Patient presents with   Abdominal Pain    Lower abdominal pain since Saturday. Pt denies urinary pain and frequency. He feels like he may have a kidney stone.    Pt reports hx of both diverticulitis and kidney stones. Last bought of diverticulitis was 15-58yrs ago after eating popcorn. Pt states it started Saturday after eating country ham for breakfast. Skipped lunch on Saturday, otherwise appetite unaffected. No change in Bms or urination. Last normal BM this morning. Pt denies dysuria, hematuria, fever, flank pain, melena. Had been taking 800mg  ibuprofen with mild to moderate relief, but was concerned as it was not resolving. Denies any bulge or radiation into groin.  Abdominal Pain This is a new problem. The current episode started in the past 7 days (Started Saturday around lunch time). The onset quality is gradual. The problem has been unchanged. The pain is located in the LLQ. The pain is moderate. The quality of the pain is aching. The abdominal pain does not radiate. Pertinent negatives include no anorexia, constipation, diarrhea, dysuria, fever, flatus, hematochezia, hematuria, melena, myalgias, nausea or vomiting. The pain is aggravated by certain positions. He has tried acetaminophen for the symptoms. The treatment provided mild relief.    Patient Active Problem List   Diagnosis Date Noted   Diabetes mellitus without complication (HCC) 12/02/2014   Essential hypertension 12/02/2014   Hyperlipidemia 08/11/2014   Travel advice encounter 08/11/2014   Gastroenteritis 05/20/2013   Pain of right heel 10/04/2012   Type II or unspecified type diabetes mellitus without mention of complication, uncontrolled 07/27/2012   Adenomatous colon polyp    GERD (gastroesophageal reflux disease) 06/25/2012   Erectile dysfunction 04/27/2012   History of  kidney stones 03/29/2012   Insomnia 12/04/2011   PLANTAR WART 02/07/2011   DM 02/04/2011   Hyperlipemia, mixed 02/04/2011   Past Medical History:  Diagnosis Date   Adenomatous colon polyp 04/2006; 2018   2007 Dr. Virginia Rochester; 2018 Dr. Misty Stanley 04/2022.   Chronic renal insufficiency, stage II (mild) 2017   CrCl 60s   Diabetes mellitus    Diverticulosis of colon    hospitalized for diverticulitis in remote past; also had episode 08/27/14   Fatty liver 2015   Noted on noncontrast CT done for eval for kidney stones   History of ETT    LOW RISK/NEG ETT 02/2011 w/Dr. Deborah Chalk   History of hemorrhoids    Hyperlipidemia    Crestor caused leg cramps   Hypertension    Left foot pain    medial: post tibial tendonitis---podiatrist did sheath injection 03/2018 and 02/2019   Nephrolithiasis    Saw u[facility]ist in DIST[facility] past (?[facility]). Left mid ureteral stone 01/2016--ED visit.   Prostate nodule 08/2018   Referred to urol (PSA was normal/unchanged from prev year).   Renal cyst, left    hemorrhagic vs proteinacious (last f/u CT 2007 showed stability)--Dr. Virginia Rochester.   Shoulder pain, right     Injection on 2 consec mo at [facility] (Dr. August Saucer) 2012.  [facility] jt inj 12/2022 ortho   Past Surgical History:  Procedure Laterality Date   CHALAZION EXCISION  summer 2015   COLONOSCOPY W/ POLYPECTOMY  04/2006; 04/2017   2007: Adenomatous polyps x 2; recall 5 yrs (Dr. Virginia Rochester).  Repeat 04/2017 (Dr. Lorretta Harp adenoma x1 + sigmoid diverticulosis).  Recall 5 yrs.   ESOPHAGOGASTRODUODENOSCOPY  2008   Esophagitis/gastritis/GERD.  H pylori and Barrett's NEG.   HEMORRHOID SURGERY     NOSE SURGERY     deviated septum   Social History   Tobacco Use   Smoking status: Never   Smokeless tobacco: Never  Vaping Use   Vaping status: Never Used  Substance Use Topics   Alcohol use: No   Drug use: No      ROS: as noted in HPI  Objective:     BP 132/85   Pulse 74   Temp 97.9 F (36.6 C) (Oral)   Wt 209 lb (94.8 kg)    SpO2 95%   BMI 32.25 kg/m  BP Readings from Last 3 Encounters:  02/26/24 132/85  02/09/24 131/77  01/11/24 130/86   Wt Readings from Last 3 Encounters:  02/26/24 209 lb (94.8 kg)  02/09/24 208 lb 3.2 oz (94.4 kg)  01/11/24 209 lb 6.4 oz (95 kg)      Physical Exam Vitals and nursing note reviewed.  Constitutional:      General: He is not in acute distress.    Appearance: He is well-developed and normal weight. He is not ill-appearing, toxic-appearing or diaphoretic.  HENT:     Head: Normocephalic and atraumatic.     Mouth/Throat:     Mouth: Mucous membranes are moist.  Eyes:     General: No scleral icterus.    Extraocular Movements: Extraocular movements intact.     Pupils: Pupils are equal, round, and reactive to light.  Cardiovascular:     Rate and Rhythm: Normal rate.  Abdominal:     General: Bowel sounds are normal. There is no distension.     Palpations: Abdomen is soft. There is no splenomegaly.     Tenderness: There is abdominal tenderness (mild, no rebound, rigidity or peritonitis) in the left lower quadrant. There is no right CVA tenderness, left CVA tenderness, guarding or rebound. Negative signs include Murphy's sign.     Hernia: No hernia is present.    Skin:    General: Skin is warm and dry.     Coloration: Skin is not jaundiced or pale.     Findings: No rash.  Neurological:     General: No focal deficit present.     Mental Status: He is alert and oriented to person, place, and time.      Results for orders placed or performed in visit on 02/26/24  POCT Urinalysis Dipstick (Automated)  Result Value Ref Range   Color, UA dark yellow    Clarity, UA clear    Glucose, UA Negative Negative   Bilirubin, UA 1+    Ketones, UA negative    Spec Grav, UA >=1.030 (A) 1.010 - 1.025   Blood, UA negative    pH, UA 5.0 5.0 - 8.0   Protein, UA Negative Negative   Urobilinogen, UA 0.2 0.2 or 1.0 E.U./dL   Nitrite, UA negative    Leukocytes, UA Negative  Negative    Last CBC Lab Results  Component Value Date   WBC 5.4 09/11/2023   HGB 14.6 09/11/2023   HCT 45.1 09/11/2023   MCV 93.0 09/11/2023   MCH 31.0 08/24/2021   RDW 13.5 09/11/2023   PLT 181.0 09/11/2023   Last metabolic panel Lab Results  Component Value Date   GLUCOSE 96 01/11/2024   NA 140 01/11/2024   K 4.3 01/11/2024   CL 98 01/11/2024   CO2 34 (H) 01/11/2024   BUN 24 (H) 01/11/2024   CREATININE  1.27 01/11/2024   GFR 56.45 (L) 01/11/2024   CALCIUM 10.4 01/11/2024   PROT 7.3 01/11/2024   ALBUMIN 4.8 01/11/2024   BILITOT 0.8 01/11/2024   ALKPHOS 28 (L) 01/11/2024   AST 31 01/11/2024   ALT 33 01/11/2024   ANIONGAP 10 08/24/2021      The 10-year ASCVD risk score (Arnett DK, et al., 2019) is: 41.9%  Assessment & Plan:  Lower abdominal pain -     POCT Urinalysis Dipstick (Automated)  Diverticulitis -     Amoxicillin-Pot Clavulanate; Take 1 tablet by mouth 2 (two) times daily with a meal for 7 days.  Dispense: 14 tablet; Refill: 0  Discussed differentials with patient, but most suspicious for acute diverticulitis. Pt exhibiting no red flag s/sx therefore imaging and labs not indicated at this time. Will treat with Augmentin BID x 7 days, ER and return to clinic precautions discussed with patient.   No follow-ups on file.   Maretta Bees, PA

## 2024-02-26 NOTE — Patient Instructions (Addendum)
 I suspect you have diverticulitis. Please read the attached handout.  Start taking Augmentin twice daily with food for the next week. You can continue taking 800mg  of ibuprofen every 8 hours as needed for the pain.  If you develop fever, worsening pain, develop blood in stool or urine, please head to the ER.

## 2024-03-08 ENCOUNTER — Other Ambulatory Visit: Payer: Self-pay | Admitting: Family Medicine

## 2024-03-16 ENCOUNTER — Other Ambulatory Visit: Payer: Self-pay | Admitting: Family Medicine

## 2024-03-25 ENCOUNTER — Ambulatory Visit (INDEPENDENT_AMBULATORY_CARE_PROVIDER_SITE_OTHER): Payer: PPO | Admitting: Family Medicine

## 2024-03-25 ENCOUNTER — Encounter: Payer: Self-pay | Admitting: Family Medicine

## 2024-03-25 VITALS — BP 128/73 | HR 69 | Ht 67.5 in | Wt 206.4 lb

## 2024-03-25 DIAGNOSIS — I10 Essential (primary) hypertension: Secondary | ICD-10-CM

## 2024-03-25 DIAGNOSIS — E119 Type 2 diabetes mellitus without complications: Secondary | ICD-10-CM

## 2024-03-25 DIAGNOSIS — E782 Mixed hyperlipidemia: Secondary | ICD-10-CM

## 2024-03-25 DIAGNOSIS — Z794 Long term (current) use of insulin: Secondary | ICD-10-CM | POA: Diagnosis not present

## 2024-03-25 DIAGNOSIS — F5101 Primary insomnia: Secondary | ICD-10-CM

## 2024-03-25 MED ORDER — TRESIBA FLEXTOUCH 100 UNIT/ML ~~LOC~~ SOPN: PEN_INJECTOR | SUBCUTANEOUS | 1 refills | Status: DC

## 2024-03-25 MED ORDER — FENOFIBRATE 145 MG PO TABS: ORAL_TABLET | ORAL | 3 refills | Status: AC

## 2024-03-25 NOTE — Progress Notes (Signed)
 OFFICE VISIT  03/25/2024  CC:  Chief Complaint  Patient presents with   Medical Management of Chronic Issues    Pt is not fasting    Patient is a 73 y.o. male who presents for follow-up diabetes and hyperlipidemia.  INTERIM HX: Feeling well. Glucose usually ranging 70-150s fasting and throughout his day. A1c calculated through his app based on his home sugars is 6.9%.   Was seen here on 02/26/2024 with lower abdominal pain and diagnosed with diverticulitis and prescribed Augmentin.  All symptoms resolved.  PMP AWARE reviewed today: most recent rx for alprazolam 1 mg was filled 01/11/2024, # 180, rx by me. No red flags.   Past Medical History:  Diagnosis Date   Adenomatous colon polyp 04/2006; 2018   2007 Dr. Virginia Rochester; 2018 Dr. Misty Stanley 04/2022.   Chronic renal insufficiency, stage II (mild) 2017   CrCl 60s   Diabetes mellitus    Diverticulosis of colon    hospitalized for diverticulitis in remote past; also had episode 08/27/14   Fatty liver 2015   Noted on noncontrast CT done for eval for kidney stones   History of ETT    LOW RISK/NEG ETT 02/2011 w/Dr. Deborah Chalk   History of hemorrhoids    Hyperlipidemia    Crestor caused leg cramps   Hypertension    Left foot pain    medial: post tibial tendonitis---podiatrist did sheath injection 03/2018 and 02/2019   Nephrolithiasis    Saw urologist in DISTANT past (?[facility]). Left mid ureteral stone 01/2016--ED visit.   Prostate nodule 08/2018   Referred to urol (PSA was normal/unchanged from prev year).   Renal cyst, left    hemorrhagic vs proteinacious (last f/u CT 2007 showed stability)--Dr. Virginia Rochester.   Shoulder pain, right     Injection on 2 consec mo at [facility] (Dr. August Saucer) 2012.  [facility] jt inj 12/2022 ortho    Past Surgical History:  Procedure Laterality Date   CHALAZION EXCISION  summer 2015   COLONOSCOPY W/ POLYPECTOMY  04/2006; 04/2017   2007: Adenomatous polyps x 2; recall 5 yrs (Dr. Virginia Rochester).  Repeat 04/2017 (Dr. Lorretta Harp  adenoma x1 + sigmoid diverticulosis).  Recall 5 yrs.   ESOPHAGOGASTRODUODENOSCOPY  2008   Esophagitis/gastritis/GERD.  H pylori and Barrett's NEG.   HEMORRHOID SURGERY     NOSE SURGERY     deviated septum    Outpatient Medications Prior to Visit  Medication Sig Dispense Refill   ALPRAZolam (XANAX) 1 MG tablet 1-2 tabs po at bedtime insomnia 180 tablet 1   atorvastatin (LIPITOR) 80 MG tablet TAKE 1 TABLET(80 MG) BY MOUTH DAILY 90 tablet 3   Continuous Glucose Sensor (FREESTYLE LIBRE 3 SENSOR) MISC PLACE 1 SENSOR ONTO THE SKIN EVERY 14 DAYS 3 each 2   ezetimibe (ZETIA) 10 MG tablet TAKE 1 TABLET(10 MG) BY MOUTH DAILY 90 tablet 1   hydrochlorothiazide (HYDRODIURIL) 25 MG tablet TAKE 1 TABLET(25 MG) BY MOUTH DAILY 90 tablet 3   Insulin Pen Needle (PEN NEEDLES) 31G X 5 MM MISC 1 each by Does not apply route daily. 100 each 5   metFORMIN (GLUCOPHAGE) 1000 MG tablet TAKE 1 TABLET(1000 MG) BY MOUTH TWICE DAILY 180 tablet 3   pantoprazole (PROTONIX) 40 MG tablet TAKE 1 TABLET(40 MG) BY MOUTH DAILY 90 tablet 3   TRESIBA FLEXTOUCH 100 UNIT/ML FlexTouch Pen INJECT 20 UNITS INTO THE SKIN EVERY MORNING AND 15 UNITS EVERY EVENING 15 mL 1   fenofibrate (TRICOR) 145 MG tablet TAKE 1 TABLET(145 MG) BY MOUTH  DAILY 90 tablet 0   No facility-administered medications prior to visit.    Allergies  Allergen Reactions   Repatha [Evolocumab] Other (See Comments)    hyperglycemia    Review of Systems As per HPI  PE:    03/25/2024    8:09 AM 02/26/2024    2:08 PM 02/09/2024   10:26 AM  Vitals with BMI  Height 5' 7.5"  5' 7.5"  Weight 206 lbs 6 oz 209 lbs 208 lbs 3 oz  BMI 31.83 32.23 32.11  Systolic 128 132 644  Diastolic 73 85 77  Pulse 69 74 75     Physical Exam  Gen: Alert, well appearing.  Patient is oriented to person, place, time, and situation. AFFECT: pleasant, lucid thought and speech. No further exam today  LABS:  Last CBC Lab Results  Component Value Date   WBC 5.4 09/11/2023    HGB 14.6 09/11/2023   HCT 45.1 09/11/2023   MCV 93.0 09/11/2023   MCH 31.0 08/24/2021   RDW 13.5 09/11/2023   PLT 181.0 09/11/2023   Last metabolic panel Lab Results  Component Value Date   GLUCOSE 96 01/11/2024   NA 140 01/11/2024   K 4.3 01/11/2024   CL 98 01/11/2024   CO2 34 (H) 01/11/2024   BUN 24 (H) 01/11/2024   CREATININE 1.27 01/11/2024   GFR 56.45 (L) 01/11/2024   CALCIUM 10.4 01/11/2024   PROT 7.3 01/11/2024   ALBUMIN 4.8 01/11/2024   BILITOT 0.8 01/11/2024   ALKPHOS 28 (L) 01/11/2024   AST 31 01/11/2024   ALT 33 01/11/2024   ANIONGAP 10 08/24/2021   Last lipids Lab Results  Component Value Date   CHOL 160 01/11/2024   HDL 40.90 01/11/2024   LDLCALC 86 01/11/2024   LDLDIRECT 127.0 12/31/2020   TRIG 168.0 (H) 01/11/2024   CHOLHDL 4 01/11/2024   Last hemoglobin A1c Lab Results  Component Value Date   HGBA1C 8.0 (A) 01/11/2024   HGBA1C 8.0 01/11/2024   HGBA1C 8.0 (A) 01/11/2024   HGBA1C 8.0 (A) 01/11/2024   Last thyroid functions Lab Results  Component Value Date   TSH 1.22 08/28/2018   IMPRESSION AND PLAN:  #1 diabetes without complication. Good control. Next A1c not due until after 04/10/2024.  Current A1c on his CGM app is 6.9%. Plan on rechecking hemoglobin A1c here in 3 months. Continue metformin 1000 mg twice a day and Tresiba 25 units every morning.  He sometimes takes up to 15 units of Tresiba in the evenings.  #2 mixed hyperlipidemia. Atorvastatin 80 mg daily, fenofibrate 145 daily, and Zetia 10 mg daily. When LDL has been above goal in the past he has declined further trial of PCSK9 inhibitor (he associates past use of Repatha with significant hyperglycemia). Plan recheck lipids in 3 months.  3.  Hypertension, well-controlled on HCTZ 25 mg a day. Plan repeat metabolic panel in 3 months.  4.  Insomnia, doing well long-term on alprazolam 1 mg, 1-2 nightly.  An After Visit Summary was printed and given to the patient.  FOLLOW UP:  Return in about 3 months (around 06/24/2024) for routine chronic illness f/u. Next CPE 08/2024 Signed:  Santiago Bumpers, MD           03/25/2024

## 2024-03-28 DIAGNOSIS — H524 Presbyopia: Secondary | ICD-10-CM | POA: Diagnosis not present

## 2024-03-28 DIAGNOSIS — H2512 Age-related nuclear cataract, left eye: Secondary | ICD-10-CM | POA: Diagnosis not present

## 2024-03-28 DIAGNOSIS — E1165 Type 2 diabetes mellitus with hyperglycemia: Secondary | ICD-10-CM | POA: Diagnosis not present

## 2024-03-28 DIAGNOSIS — H52223 Regular astigmatism, bilateral: Secondary | ICD-10-CM | POA: Diagnosis not present

## 2024-03-28 DIAGNOSIS — H0288B Meibomian gland dysfunction left eye, upper and lower eyelids: Secondary | ICD-10-CM | POA: Diagnosis not present

## 2024-03-28 DIAGNOSIS — H0288A Meibomian gland dysfunction right eye, upper and lower eyelids: Secondary | ICD-10-CM | POA: Diagnosis not present

## 2024-03-28 LAB — HM DIABETES EYE EXAM

## 2024-04-24 ENCOUNTER — Ambulatory Visit: Payer: PPO

## 2024-04-24 VITALS — BP 128/73 | Ht 67.5 in | Wt 200.0 lb

## 2024-04-24 DIAGNOSIS — Z0000 Encounter for general adult medical examination without abnormal findings: Secondary | ICD-10-CM

## 2024-04-24 DIAGNOSIS — Z1211 Encounter for screening for malignant neoplasm of colon: Secondary | ICD-10-CM | POA: Diagnosis not present

## 2024-04-24 DIAGNOSIS — Z2821 Immunization not carried out because of patient refusal: Secondary | ICD-10-CM

## 2024-04-24 NOTE — Patient Instructions (Signed)
 Shaun Knight , Thank you for taking time to come for your Medicare Wellness Visit. I appreciate your ongoing commitment to your health goals. Please review the following plan we discussed and let me know if I can assist you in the future.   Referrals/Orders/Follow-Ups/Clinician Recommendations: follow up as scheduled for next AWV  This is a list of the screening recommended for you and due dates:  Health Maintenance  Topic Date Due   Zoster (Shingles) Vaccine (1 of 2) 07/19/1970   Colon Cancer Screening  04/25/2022   DTaP/Tdap/Td vaccine (2 - Td or Tdap) 07/27/2022   COVID-19 Vaccine (4 - 2024-25 season) 08/20/2023   Hemoglobin A1C  07/10/2024   Flu Shot  07/19/2024   Complete foot exam   09/10/2024   Yearly kidney function blood test for diabetes  01/10/2025   Yearly kidney health urinalysis for diabetes  01/10/2025   Eye exam for diabetics  03/28/2025   Medicare Annual Wellness Visit  04/24/2025   Pneumonia Vaccine  Completed   Hepatitis C Screening  Completed   HPV Vaccine  Aged Out   Meningitis B Vaccine  Aged Out    Advanced directives: (Declined) Advance directive discussed with you today. Even though you declined this today, please call our office should you change your mind, and we can give you the proper paperwork for you to fill out.  Next Medicare Annual Wellness Visit scheduled for next year: Yes  Have you seen your provider in the last 6 months (3 months if uncontrolled diabetes)? Yes

## 2024-04-24 NOTE — Progress Notes (Signed)
 Because this visit was a virtual/telehealth visit,  certain criteria was not obtained, such a blood pressure, CBG if applicable, and timed get up and go. Any medications not marked as "taking" were not mentioned during the medication reconciliation part of the visit. Any vitals not documented were not able to be obtained due to this being a telehealth visit or patient was unable to self-report a recent blood pressure reading due to a lack of equipment at home via telehealth. Vitals that have been documented are verbally provided by the patient.   This visit was performed by a medical professional under my direct supervision. I was immediately available for consultation/collaboration. I have reviewed and agree with the Annual Wellness Visit documentation.  Subjective:   Shaun Knight is a 73 y.o. who presents for a Medicare Wellness preventive visit.  Visit Complete: Virtual I connected with  Yale Held on 04/24/24 by a audio enabled telemedicine application and verified that I am speaking with the correct person using two identifiers.  Patient Location: Home  Provider Location: Home Office  I discussed the limitations of evaluation and management by telemedicine. The patient expressed understanding and agreed to proceed.  Vital Signs: Because this visit was a virtual/telehealth visit, some criteria may be missing or patient reported. Any vitals not documented were not able to be obtained and vitals that have been documented are patient reported.  VideoDeclined- This patient declined Librarian, academic. Therefore the visit was completed with audio only.  Persons Participating in Visit: Patient.  AWV Questionnaire: No: Patient Medicare AWV questionnaire was not completed prior to this visit.  Cardiac Risk Factors include: advanced age (>34men, >46 women);male gender;hypertension;obesity (BMI >30kg/m2);diabetes mellitus;dyslipidemia     Objective:    Today's  Vitals   04/24/24 0936  BP: 128/73  Weight: 200 lb (90.7 kg)  Height: 5' 7.5" (1.715 m)   Body mass index is 30.86 kg/m.     04/24/2024    9:39 AM 04/19/2023    9:34 AM 04/13/2022    8:43 AM 08/24/2021    6:55 PM 03/31/2021    8:15 AM 10/08/2019    5:03 PM 08/25/2017   10:59 AM  Advanced Directives  Does Patient Have a Medical Advance Directive? Yes Yes Yes No Yes No Yes  Type of Estate agent of [place];Living will Living will;Healthcare Power of [employer] Power of [employer] Power of [place];Living will  Living will  Does patient want to make changes to medical advance directive? No - Patient declined        Copy of Healthcare Power of Attorney in Chart? No - copy requested  No - copy requested  No - copy requested      Current Medications (verified) Outpatient Encounter Medications as of 04/24/2024  Medication Sig   ALPRAZolam  (XANAX ) 1 MG tablet 1-2 tabs po at bedtime insomnia   atorvastatin  (LIPITOR) 80 MG tablet TAKE 1 TABLET(80 MG) BY MOUTH DAILY   Continuous Glucose Sensor (FREESTYLE LIBRE 3 SENSOR) MISC PLACE 1 SENSOR ONTO THE SKIN EVERY 14 DAYS   ezetimibe  (ZETIA ) 10 MG tablet TAKE 1 TABLET(10 MG) BY MOUTH DAILY   fenofibrate  (TRICOR ) 145 MG tablet TAKE 1 TABLET(145 MG) BY MOUTH DAILY   hydrochlorothiazide  (HYDRODIURIL ) 25 MG tablet TAKE 1 TABLET(25 MG) BY MOUTH DAILY   Insulin  Pen Needle (PEN NEEDLES) 31G X 5 MM MISC 1 each by Does not apply route daily.   metFORMIN  (GLUCOPHAGE ) 1000 MG tablet TAKE 1 TABLET(1000 MG) BY  MOUTH TWICE DAILY   pantoprazole  (PROTONIX ) 40 MG tablet TAKE 1 TABLET(40 MG) BY MOUTH DAILY   TRESIBA  FLEXTOUCH 100 UNIT/ML FlexTouch Pen INJECT 20 UNITS INTO THE SKIN EVERY MORNING AND 15 UNITS EVERY EVENING   No facility-administered encounter medications on file as of 04/24/2024.    Allergies (verified) Repatha  [evolocumab ]   History: Past Medical History:  Diagnosis Date   Adenomatous colon polyp 04/2006; 2018    2007 Dr. Alethia Huxley; 2018 Dr. Bland Bunnell 04/2022.   Chronic renal insufficiency, stage II (mild) 2017   CrCl 60s   Diabetes mellitus    Diverticulosis of colon    hospitalized for diverticulitis in remote past; also had episode 08/27/14   Fatty liver 2015   Noted on noncontrast CT done for eval for kidney stones   History of ETT    LOW RISK/NEG ETT 02/2011 w/Dr. Ollis Bi   History of hemorrhoids    Hyperlipidemia    Crestor  caused leg cramps   Hypertension    Left foot pain    medial: post tibial tendonitis---podiatrist did sheath injection 03/2018 and 02/2019   Nephrolithiasis    Saw urologist in DISTANT past (?[facility]). Left mid ureteral stone 01/2016--ED visit.   Prostate nodule 08/2018   Referred to urol (PSA was normal/unchanged from prev year).   Renal cyst, left    hemorrhagic vs proteinacious (last f/u CT 2007 showed stability)--Dr. Alethia Huxley.   Shoulder pain, right     Injection on 2 consec mo at [facility] (Dr. Rozelle Corning) 2012.  [facility] jt inj 12/2022 ortho   Past Surgical History:  Procedure Laterality Date   CHALAZION EXCISION  summer 2015   COLONOSCOPY W/ POLYPECTOMY  04/2006; 04/2017   2007: Adenomatous polyps x 2; recall 5 yrs (Dr. Alethia Huxley).  Repeat 04/2017 (Dr. Maury Space adenoma x1 + sigmoid diverticulosis).  Recall 5 yrs.   ESOPHAGOGASTRODUODENOSCOPY  2008   Esophagitis/gastritis/GERD.  H pylori and Barrett's NEG.   HEMORRHOID SURGERY     NOSE SURGERY     deviated septum   Family History  Problem Relation Age of Onset   Diabetes Mother    Hypertension Mother    Coronary artery disease Mother    Diabetes Father    Hypertension Father    Coronary artery disease Father    Lymphoma Father        non-hodgkin   Obesity Daughter    Cancer Paternal Grandmother        unknown   Social History   Socioeconomic History   Marital status: Married    Spouse name: Not on file   Number of children: Not on file   Years of education: Not on file   Highest education level: 12th grade   Occupational History   Not on file  Tobacco Use   Smoking status: Never   Smokeless tobacco: Never  Vaping Use   Vaping status: Never Used  Substance and Sexual Activity   Alcohol use: No   Drug use: No   Sexual activity: Yes    Partners: Female  Other Topics Concern   Not on file  Social History Narrative   Married, 1 son and 1 daughter.   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   No T/A/Ds.   Social Drivers of Corporate investment banker Strain: Low Risk  (04/24/2024)   Overall Financial Resource Strain (CARDIA)    Difficulty of Paying Living Expenses: Not hard at all  Food Insecurity: No Food Insecurity (04/24/2024)   Hunger  Vital Sign    Worried About Programme researcher, broadcasting/film/video in the Last Year: Never true    Ran Out of Food in the Last Year: Never true  Transportation Needs: No Transportation Needs (04/24/2024)   PRAPARE - Administrator, Civil Service (Medical): No    Lack of Transportation (Non-Medical): No  Physical Activity: Inactive (04/24/2024)   Exercise Vital Sign    Days of Exercise per Week: 3 days    Minutes of Exercise per Session: 0 min  Stress: Stress Concern Present (04/24/2024)   [employer] of Occupational Health - Occupational Stress Questionnaire    Feeling of Stress : To some extent  Social Connections: Socially Integrated (04/24/2024)   Social Connection and Isolation Panel [NHANES]    Frequency of Communication with Friends and Family: More than three times a week    Frequency of Social Gatherings with Friends and Family: More than three times a week    Attends Religious Services: More than 4 times per year    Active Member of [employer] or Organizations: Yes    Attends Engineer, structural: More than 4 times per year    Marital Status: Married    Tobacco Counseling Counseling given: Not Answered    Clinical Intake:  Pre-visit preparation completed: Yes  Pain : No/denies pain      BMI - recorded: 30.86 Nutritional Status: BMI > 30  Obese Nutritional Risks: None Diabetes: Yes CBG done?: No Did pt. bring in CBG monitor from home?: No  Lab Results  Component Value Date   HGBA1C 8.0 (A) 01/11/2024   HGBA1C 8.0 01/11/2024   HGBA1C 8.0 (A) 01/11/2024   HGBA1C 8.0 (A) 01/11/2024     How often do you need to have someone help you when you read instructions, pamphlets, or other written materials from your doctor or pharmacy?: 1 - Never What is the last grade level you completed in school?: 12th grade     Information entered by :: [employer]   Activities of Daily Living     04/24/2024    9:40 AM  In your present state of health, do you have any difficulty performing the following activities:  Hearing? 0  Vision? 0  Difficulty concentrating or making decisions? 0  Walking or climbing stairs? 0  Dressing or bathing? 0  Doing errands, shopping? 0  Preparing Food and eating ? N  Using the Toilet? N  In the past six months, have you accidently leaked urine? N  Do you have problems with loss of bowel control? N  Managing your Medications? N  Managing your Finances? N  Housekeeping or managing your Housekeeping? N    Patient Care Team: Shelvia Dick, MD as PCP - General (Family Medicine) Janel Medford, MD (Inactive) as Consulting Physician (Gastroenterology) Celia Coles Angus Kenning, DPM as Consulting Physician (Podiatry) Syliva Even, MD as Consulting Physician (Sports Medicine) Altamease Asters, DO (Optometry) [place], Aviva Lemmings, MD as Consulting Physician (Cardiology)  Indicate any recent Medical Services you may have received from other than [facility] providers in the past year (date may be approximate).     Assessment:   This is a routine wellness examination for [place].  Hearing/Vision screen Hearing Screening - Comments:: No difficulties Vision Screening - Comments:: No vision issues   Goals Addressed             This Visit's Progress     Patient Stated   On track    None at  this time        Depression Screen     04/24/2024    9:42 AM 03/25/2024    8:10 AM 01/11/2024    8:09 AM 04/19/2023    9:23 AM 04/13/2022    8:41 AM 12/30/2021    8:48 AM 03/31/2021    8:18 AM  PHQ 2/9 Scores  PHQ - 2 Score 2 0 0 0 0 0 0  PHQ- 9 Score 2  0        Fall Risk     04/24/2024    9:39 AM 03/25/2024    8:10 AM 01/11/2024    8:09 AM 04/19/2023    9:35 AM 04/04/2023   10:01 AM  Fall Risk   Falls in the past year? 0 0 0 0 0  Number falls in past yr: 0 0  0   Injury with Fall? 0 0  1   Risk for fall due to : No Fall Risks No Fall Risks  No Fall Risks   Follow up Falls prevention discussed;Falls evaluation completed Falls evaluation completed Falls evaluation completed Falls evaluation completed     MEDICARE RISK AT HOME:  Medicare Risk at Home Any stairs in or around the home?: Yes If so, are there any without handrails?: No Home free of loose throw rugs in walkways, pet beds, electrical cords, etc?: Yes Adequate lighting in your home to reduce risk of falls?: Yes Life alert?: No Use of a cane, walker or w/c?: No Grab bars in the bathroom?: Yes Shower chair or bench in shower?: Yes Elevated toilet seat or a handicapped toilet?: Yes  TIMED UP AND GO:  Was the test performed?  No  Cognitive Function: 6CIT completed        04/24/2024    9:38 AM 04/19/2023    9:36 AM 04/13/2022    8:46 AM  6CIT Screen  What Year? 0 points 0 points 0 points  What month? 0 points 0 points 0 points  What time? 0 points 0 points 0 points  Count back from 20 0 points 0 points 0 points  Months in reverse 0 points 0 points 0 points  Repeat phrase 0 points 0 points 4 points  Total Score 0 points 0 points 4 points    Immunizations Immunization History  Administered Date(s) Administered   Fluad Quad(high Dose 65+) 09/17/2019, 12/31/2020   Fluad Trivalent(High Dose 65+) 09/11/2023   Hepatitis A, Adult 08/11/2014   Influenza Whole 10/19/2010,  10/04/2011   Influenza, High Dose Seasonal PF 10/24/2016, 08/25/2017, 08/28/2018   Influenza,inj,Quad PF,6+ Mos 08/11/2014, 09/30/2015   Influenza-Unspecified 12/08/2021   PFIZER(Purple Top)SARS-COV-2 Vaccination 01/08/2020, 01/29/2020, 11/11/2020   Pneumococcal Conjugate-13 08/25/2017   Pneumococcal Polysaccharide-23 07/27/2012, 08/28/2018   Tdap 07/27/2012   Zoster, Live 11/13/2011    Screening Tests Health Maintenance  Topic Date Due   Zoster Vaccines- Shingrix  (1 of 2) 07/19/1970   Colonoscopy  04/25/2022   DTaP/Tdap/Td (2 - Td or Tdap) 07/27/2022   COVID-19 Vaccine (4 - 2024-25 season) 08/20/2023   HEMOGLOBIN A1C  07/10/2024   INFLUENZA VACCINE  07/19/2024   FOOT EXAM  09/10/2024   Diabetic kidney evaluation - eGFR measurement  01/10/2025   Diabetic kidney evaluation - Urine ACR  01/10/2025   OPHTHALMOLOGY EXAM  03/28/2025   Medicare Annual Wellness (AWV)  04/24/2025   Pneumonia Vaccine 22+ Years old  Completed   Hepatitis C Screening  Completed   HPV VACCINES  Aged Out   Meningococcal B Vaccine  Aged Out    Health Maintenance  Health Maintenance Due  Topic Date Due   Zoster Vaccines- Shingrix  (1 of 2) 07/19/1970   Colonoscopy  04/25/2022   DTaP/Tdap/Td (2 - Td or Tdap) 07/27/2022   COVID-19 Vaccine (4 - 2024-25 season) 08/20/2023   Health Maintenance Items Addressed: Referral sent to GI for colonoscopy patient declined vaccinations  Additional Screening:  Vision Screening: Recommended annual ophthalmology exams for early detection of glaucoma and other disorders of the eye.  Dental Screening: Recommended annual dental exams for proper oral hygiene  Community Resource Referral / Chronic Care Management: CRR required this visit?  No   CCM required this visit?  No     Plan:     I have personally reviewed and noted the following in the patient's chart:   Medical and social history Use of alcohol, tobacco or illicit drugs  Current medications and  supplements including opioid prescriptions. Patient is not currently taking opioid prescriptions. Functional ability and status Nutritional status Physical activity Advanced directives List of other physicians Hospitalizations, surgeries, and ER visits in previous 12 months Vitals Screenings to include cognitive, depression, and falls Referrals and appointments  In addition, I have reviewed and discussed with patient certain preventive protocols, quality metrics, and best practice recommendations. A written personalized care plan for preventive services as well as general preventive health recommendations were provided to patient.     Freeda Jerry, [place]   04/24/2024   After Visit Summary: (MyChart) Due to this being a telephonic visit, the after visit summary with patients personalized plan was offered to patient via MyChart   Notes: Nothing significant to report at this time.

## 2024-06-18 NOTE — Patient Instructions (Incomplete)
   Please do not forget to complete your repeat colonoscopy.  If you are interested, you can get the tetanus and shingles vaccine at [facility].

## 2024-06-19 ENCOUNTER — Other Ambulatory Visit: Payer: Self-pay

## 2024-06-19 MED ORDER — TRESIBA FLEXTOUCH 100 UNIT/ML ~~LOC~~ SOPN
PEN_INJECTOR | SUBCUTANEOUS | 1 refills | Status: DC
Start: 2024-06-19 — End: 2024-09-16

## 2024-06-24 ENCOUNTER — Ambulatory Visit: Admitting: Family Medicine

## 2024-06-24 NOTE — Progress Notes (Deleted)
 OFFICE VISIT  06/24/2024  CC: No chief complaint on file.   Patient is a 73 y.o. male who presents for 51-month follow-up diabetes, hypertension, and hypercholesterolemia. A/P as of last visit: 1 diabetes without complication. Good control. Next A1c not due until after 04/10/2024.  Current A1c on his CGM app is 6.9%. Plan on rechecking hemoglobin A1c here in 3 months. Continue metformin  1000 mg twice a day and Tresiba  25 units every morning.  He sometimes takes up to 15 units of Tresiba  in the evenings.   #2 mixed hyperlipidemia. Atorvastatin  80 mg daily, fenofibrate  145 daily, and Zetia  10 mg daily. When LDL has been above goal in the past he has declined further trial of PCSK9 inhibitor (he associates past use of Repatha  with significant hyperglycemia). Plan recheck lipids in 3 months.   3.  Hypertension, well-controlled on HCTZ 25 mg a day. Plan repeat metabolic panel in 3 months.   4.  Insomnia, doing well long-term on alprazolam  1 mg, 1-2 nightly.  INTERIM HX: ***    PMP AWARE reviewed today: most recent rx for alprazolam  was filled 04/17/2024, # 180, rx by me. No red flags.  Past Medical History:  Diagnosis Date   Adenomatous colon polyp 04/2006; 2018   2007 Dr. Geroge; 2018 Dr. Mort 04/2022.   Chronic renal insufficiency, stage II (mild) 2017   CrCl 60s   Diabetes mellitus    Diverticulosis of colon    hospitalized for diverticulitis in remote past; also had episode 08/27/14   Fatty liver 2015   Noted on noncontrast CT done for eval for kidney stones   History of ETT    LOW RISK/NEG ETT 02/2011 w/Dr. Tisa   History of hemorrhoids    Hyperlipidemia    Crestor  caused leg cramps   Hypertension    Left foot pain    medial: post tibial tendonitis---podiatrist did sheath injection 03/2018 and 02/2019   Nephrolithiasis    Saw urologist in DISTANT past (?[facility]). Left mid ureteral stone 01/2016--ED visit.   Prostate nodule 08/2018   Referred to urol (PSA was  normal/unchanged from prev year).   Renal cyst, left    hemorrhagic vs proteinacious (last f/u CT 2007 showed stability)--Dr. Geroge.   Shoulder pain, right     Injection on 2 consec mo at [facility] (Dr. Addie) 2012.  [facility] jt inj 12/2022 ortho    Past Surgical History:  Procedure Laterality Date   CHALAZION EXCISION  summer 2015   COLONOSCOPY W/ POLYPECTOMY  04/2006; 04/2017   2007: Adenomatous polyps x 2; recall 5 yrs (Dr. Geroge).  Repeat 04/2017 (Dr. Phares adenoma x1 + sigmoid diverticulosis).  Recall 5 yrs.   ESOPHAGOGASTRODUODENOSCOPY  2008   Esophagitis/gastritis/GERD.  H pylori and Barrett's NEG.   HEMORRHOID SURGERY     NOSE SURGERY     deviated septum    Outpatient Medications Prior to Visit  Medication Sig Dispense Refill   ALPRAZolam  (XANAX ) 1 MG tablet 1-2 tabs po at bedtime insomnia 180 tablet 1   atorvastatin  (LIPITOR) 80 MG tablet TAKE 1 TABLET(80 MG) BY MOUTH DAILY 90 tablet 3   Continuous Glucose Sensor (FREESTYLE LIBRE 3 SENSOR) MISC PLACE 1 SENSOR ONTO THE SKIN EVERY 14 DAYS 3 each 2   ezetimibe  (ZETIA ) 10 MG tablet TAKE 1 TABLET(10 MG) BY MOUTH DAILY 90 tablet 1   fenofibrate  (TRICOR ) 145 MG tablet TAKE 1 TABLET(145 MG) BY MOUTH DAILY 90 tablet 3   hydrochlorothiazide  (HYDRODIURIL ) 25 MG tablet TAKE 1 TABLET(25 MG)  BY MOUTH DAILY 90 tablet 3   Insulin  Pen Needle (PEN NEEDLES) 31G X 5 MM MISC 1 each by Does not apply route daily. 100 each 5   metFORMIN  (GLUCOPHAGE ) 1000 MG tablet TAKE 1 TABLET(1000 MG) BY MOUTH TWICE DAILY 180 tablet 3   pantoprazole  (PROTONIX ) 40 MG tablet TAKE 1 TABLET(40 MG) BY MOUTH DAILY 90 tablet 3   TRESIBA  FLEXTOUCH 100 UNIT/ML FlexTouch Pen INJECT 20 UNITS INTO THE SKIN EVERY MORNING AND 15 UNITS EVERY EVENING 15 mL 1   No facility-administered medications prior to visit.    Allergies  Allergen Reactions   Repatha  [Evolocumab ] Other (See Comments)    hyperglycemia    Review of Systems As per HPI  PE:    04/24/2024    9:36 AM  03/25/2024    8:09 AM 02/26/2024    2:08 PM  Vitals with BMI  Height 5' 7.5 5' 7.5   Weight 200 lbs 206 lbs 6 oz 209 lbs  BMI 30.84 31.83 32.23  Systolic 128 128 867  Diastolic 73 73 85  Pulse  69 74     Physical Exam  ***  LABS:  Last CBC Lab Results  Component Value Date   WBC 5.4 09/11/2023   HGB 14.6 09/11/2023   HCT 45.1 09/11/2023   MCV 93.0 09/11/2023   MCH 31.0 08/24/2021   RDW 13.5 09/11/2023   PLT 181.0 09/11/2023   Last metabolic panel Lab Results  Component Value Date   GLUCOSE 96 01/11/2024   NA 140 01/11/2024   K 4.3 01/11/2024   CL 98 01/11/2024   CO2 34 (H) 01/11/2024   BUN 24 (H) 01/11/2024   CREATININE 1.27 01/11/2024   GFR 56.45 (L) 01/11/2024   CALCIUM  10.4 01/11/2024   PROT 7.3 01/11/2024   ALBUMIN 4.8 01/11/2024   BILITOT 0.8 01/11/2024   ALKPHOS 28 (L) 01/11/2024   AST 31 01/11/2024   ALT 33 01/11/2024   ANIONGAP 10 08/24/2021   Last lipids Lab Results  Component Value Date   CHOL 160 01/11/2024   HDL 40.90 01/11/2024   LDLCALC 86 01/11/2024   LDLDIRECT 127.0 12/31/2020   TRIG 168.0 (H) 01/11/2024   CHOLHDL 4 01/11/2024   Last hemoglobin A1c Lab Results  Component Value Date   HGBA1C 8.0 (A) 01/11/2024   HGBA1C 8.0 01/11/2024   HGBA1C 8.0 (A) 01/11/2024   HGBA1C 8.0 (A) 01/11/2024   Last thyroid  functions Lab Results  Component Value Date   TSH 1.22 08/28/2018   IMPRESSION AND PLAN:  No problem-specific Assessment & Plan notes found for this encounter.   An After Visit Summary was printed and given to the patient.  FOLLOW UP: No follow-ups on file.  Signed:  Gerlene Hockey, MD           06/24/2024

## 2024-06-27 ENCOUNTER — Encounter: Payer: Self-pay | Admitting: Family Medicine

## 2024-06-27 ENCOUNTER — Ambulatory Visit (INDEPENDENT_AMBULATORY_CARE_PROVIDER_SITE_OTHER): Admitting: Family Medicine

## 2024-06-27 VITALS — BP 129/75 | HR 77 | Temp 97.4°F | Ht 67.5 in | Wt 205.4 lb

## 2024-06-27 DIAGNOSIS — I10 Essential (primary) hypertension: Secondary | ICD-10-CM

## 2024-06-27 DIAGNOSIS — Z794 Long term (current) use of insulin: Secondary | ICD-10-CM | POA: Diagnosis not present

## 2024-06-27 DIAGNOSIS — Z7984 Long term (current) use of oral hypoglycemic drugs: Secondary | ICD-10-CM

## 2024-06-27 DIAGNOSIS — F5101 Primary insomnia: Secondary | ICD-10-CM | POA: Diagnosis not present

## 2024-06-27 DIAGNOSIS — E7800 Pure hypercholesterolemia, unspecified: Secondary | ICD-10-CM

## 2024-06-27 DIAGNOSIS — E119 Type 2 diabetes mellitus without complications: Secondary | ICD-10-CM

## 2024-06-27 LAB — POCT GLYCOSYLATED HEMOGLOBIN (HGB A1C)
HbA1c POC (<> result, manual entry): 7.1 % (ref 4.0–5.6)
HbA1c, POC (controlled diabetic range): 7.1 % — AB (ref 0.0–7.0)
HbA1c, POC (prediabetic range): 7.1 % — AB (ref 5.7–6.4)
Hemoglobin A1C: 7.1 % — AB (ref 4.0–5.6)

## 2024-06-27 MED ORDER — FREESTYLE LIBRE 3 SENSOR MISC: 2 refills | Status: DC

## 2024-06-27 MED ORDER — PEN NEEDLES 31G X 5 MM MISC: 1.0000 | Freq: Every day | 12 refills | Status: AC

## 2024-06-27 MED ORDER — ALPRAZOLAM 1 MG PO TABS: ORAL_TABLET | ORAL | 1 refills | Status: AC

## 2024-06-27 NOTE — Progress Notes (Signed)
 OFFICE VISIT  06/27/2024  CC:  Chief Complaint  Patient presents with   Medical Management of Chronic Issues    Patient is a 73 y.o. male who presents for 52-month follow-up diabetes, hypertension, and hypercholesterolemia. A/P as of last visit: 1 diabetes without complication. Good control. Next A1c not due until after 04/10/2024.  Current A1c on his CGM app is 6.9%. Plan on rechecking hemoglobin A1c here in 3 months. Continue metformin  1000 mg twice a day and Tresiba  25 units every morning.  He sometimes takes up to 15 units of Tresiba  in the evenings.   #2 mixed hyperlipidemia. Atorvastatin  80 mg daily, fenofibrate  145 daily, and Zetia  10 mg daily. When LDL has been above goal in the past he has declined further trial of PCSK9 inhibitor (he associates past use of Repatha  with significant hyperglycemia). Plan recheck lipids in 3 months.   3.  Hypertension, well-controlled on HCTZ 25 mg a day. Plan repeat metabolic panel in 3 months.   4.  Insomnia, doing well long-term on alprazolam  1 mg, 1-2 nightly.  INTERIM HX: Shaun Knight is doing well. He is doing regular glucose monitoring and stays within normal range for the most part.  Rare hypoglycemia.  He sometimes cheats on his diet and gets up around 300 but not prolonged. Takes 25 units of Tresiba  in the morning and sometimes gives another 15 in the evenings.  He takes metformin  1000 mg twice a day.  He does well long-term with use of alprazolam  in the nighttime. PMP AWARE reviewed today: most recent rx for alprazolam  1 mg was filled 04/17/2024, # 180, rx by me. No red flags.  ROS as above, plus--> no fevers, no CP, no SOB, no wheezing, no cough, no dizziness, no HAs, no rashes, no melena/hematochezia.  No polyuria or polydipsia.  No myalgias or arthralgias.  No focal weakness, paresthesias, or tremors.  No acute vision or hearing abnormalities.  No dysuria or unusual/new urinary urgency or frequency.  No recent changes in lower  legs. No n/v/d or abd pain.  No palpitations.    Past Medical History:  Diagnosis Date   Adenomatous colon polyp 04/2006; 2018   2007 Dr. Geroge; 2018 Dr. Mort 04/2022.   Chronic renal insufficiency, stage II (mild) 2017   CrCl 60s   Diabetes mellitus    Diverticulosis of colon    hospitalized for diverticulitis in remote past; also had episode 08/27/14   Fatty liver 2015   Noted on noncontrast CT done for eval for kidney stones   History of ETT    LOW RISK/NEG ETT 02/2011 w/Dr. Tisa   History of hemorrhoids    Hyperlipidemia    Crestor  caused leg cramps   Hypertension    Left foot pain    medial: post tibial tendonitis---podiatrist did sheath injection 03/2018 and 02/2019   Nephrolithiasis    Saw urologist in DISTANT past (?[facility]). Left mid ureteral stone 01/2016--ED visit.   Prostate nodule 08/2018   Referred to urol (PSA was normal/unchanged from prev year).   Renal cyst, left    hemorrhagic vs proteinacious (last f/u CT 2007 showed stability)--Dr. Geroge.   Shoulder pain, right     Injection on 2 consec mo at [facility] (Dr. Addie) 2012.  [facility] jt inj 12/2022 ortho    Past Surgical History:  Procedure Laterality Date   CHALAZION EXCISION  summer 2015   COLONOSCOPY W/ POLYPECTOMY  04/2006; 04/2017   2007: Adenomatous polyps x 2; recall 5 yrs (Dr. Geroge).  Repeat  04/2017 (Dr. Phares adenoma x1 + sigmoid diverticulosis).  Recall 5 yrs.   ESOPHAGOGASTRODUODENOSCOPY  2008   Esophagitis/gastritis/GERD.  H pylori and Barrett's NEG.   HEMORRHOID SURGERY     NOSE SURGERY     deviated septum    Outpatient Medications Prior to Visit  Medication Sig Dispense Refill   ALPRAZolam  (XANAX ) 1 MG tablet 1-2 tabs po at bedtime insomnia 180 tablet 1   atorvastatin  (LIPITOR) 80 MG tablet TAKE 1 TABLET(80 MG) BY MOUTH DAILY 90 tablet 3   Continuous Glucose Sensor (FREESTYLE LIBRE 3 SENSOR) MISC PLACE 1 SENSOR ONTO THE SKIN EVERY 14 DAYS 3 each 2   ezetimibe  (ZETIA ) 10 MG tablet  TAKE 1 TABLET(10 MG) BY MOUTH DAILY 90 tablet 1   fenofibrate  (TRICOR ) 145 MG tablet TAKE 1 TABLET(145 MG) BY MOUTH DAILY 90 tablet 3   hydrochlorothiazide  (HYDRODIURIL ) 25 MG tablet TAKE 1 TABLET(25 MG) BY MOUTH DAILY 90 tablet 3   Insulin  Pen Needle (PEN NEEDLES) 31G X 5 MM MISC 1 each by Does not apply route daily. 100 each 5   metFORMIN  (GLUCOPHAGE ) 1000 MG tablet TAKE 1 TABLET(1000 MG) BY MOUTH TWICE DAILY 180 tablet 3   pantoprazole  (PROTONIX ) 40 MG tablet TAKE 1 TABLET(40 MG) BY MOUTH DAILY 90 tablet 3   TRESIBA  FLEXTOUCH 100 UNIT/ML FlexTouch Pen INJECT 20 UNITS INTO THE SKIN EVERY MORNING AND 15 UNITS EVERY EVENING 15 mL 1   No facility-administered medications prior to visit.    Allergies  Allergen Reactions   Repatha  [Evolocumab ] Other (See Comments)    hyperglycemia    Review of Systems As per HPI  PE:    06/27/2024    3:18 PM 04/24/2024    9:36 AM 03/25/2024    8:09 AM  Vitals with BMI  Height 5' 7.5 5' 7.5 5' 7.5  Weight 205 lbs 6 oz 200 lbs 206 lbs 6 oz  BMI 31.68 30.84 31.83  Systolic 129 128 871  Diastolic 75 73 73  Pulse 77  69     Physical Exam  Gen: Alert, well appearing.  Patient is oriented to person, place, time, and situation. AFFECT: pleasant, lucid thought and speech. No further exam today  LABS:  Last CBC Lab Results  Component Value Date   WBC 5.4 09/11/2023   HGB 14.6 09/11/2023   HCT 45.1 09/11/2023   MCV 93.0 09/11/2023   MCH 31.0 08/24/2021   RDW 13.5 09/11/2023   PLT 181.0 09/11/2023   Last metabolic panel Lab Results  Component Value Date   GLUCOSE 96 01/11/2024   NA 140 01/11/2024   K 4.3 01/11/2024   CL 98 01/11/2024   CO2 34 (H) 01/11/2024   BUN 24 (H) 01/11/2024   CREATININE 1.27 01/11/2024   GFR 56.45 (L) 01/11/2024   CALCIUM  10.4 01/11/2024   PROT 7.3 01/11/2024   ALBUMIN 4.8 01/11/2024   BILITOT 0.8 01/11/2024   ALKPHOS 28 (L) 01/11/2024   AST 31 01/11/2024   ALT 33 01/11/2024   ANIONGAP 10 08/24/2021    Last lipids Lab Results  Component Value Date   CHOL 160 01/11/2024   HDL 40.90 01/11/2024   LDLCALC 86 01/11/2024   LDLDIRECT 127.0 12/31/2020   TRIG 168.0 (H) 01/11/2024   CHOLHDL 4 01/11/2024   Last hemoglobin A1c Lab Results  Component Value Date   HGBA1C 8.0 (A) 01/11/2024   HGBA1C 8.0 01/11/2024   HGBA1C 8.0 (A) 01/11/2024   HGBA1C 8.0 (A) 01/11/2024   Last thyroid  functions Lab  Results  Component Value Date   TSH 1.22 08/28/2018   Lab Results  Component Value Date   PSA 0.60 01/11/2024   PSA 0.54 12/28/2022   PSA 0.66 12/30/2021   IMPRESSION AND PLAN:  1 diabetes without complication, good control. Point-of-care hemoglobin A1c here today is 7.1%. Continue metformin  1000 mg twice a day and Tresiba  25 units every morning.  He sometimes takes up to 15 units of Tresiba  in the evenings. Urine microalbumin/creatinine today.  #2 mixed hyperlipidemia. Atorvastatin  80 mg daily, fenofibrate  145 daily, and Zetia  10 mg daily. When LDL has been above goal in the past he has declined further trial of PCSK9 inhibitor (he associates past use of Repatha  with significant hyperglycemia). Plan recheck lipids in 3 months.   3.  Hypertension, well-controlled on HCTZ 25 mg a day. Electrolytes and creatinine today.   4.  Insomnia, doing well long-term on alprazolam  1 mg, 1-2 nightly. New prescription for #180, refill x 1.  An After Visit Summary was printed and given to the patient.  FOLLOW UP: No follow-ups on file.  Next CPE September/October 2025

## 2024-06-28 ENCOUNTER — Ambulatory Visit: Payer: Self-pay | Admitting: Family Medicine

## 2024-06-28 LAB — MICROALBUMIN / CREATININE URINE RATIO
Creatinine,U: 120.3 mg/dL
Microalb Creat Ratio: 10.2 mg/g (ref 0.0–30.0)
Microalb, Ur: 1.2 mg/dL (ref 0.0–1.9)

## 2024-06-28 LAB — BASIC METABOLIC PANEL WITH GFR
BUN: 23 mg/dL (ref 6–23)
CO2: 30 meq/L (ref 19–32)
Calcium: 9.3 mg/dL (ref 8.4–10.5)
Chloride: 100 meq/L (ref 96–112)
Creatinine, Ser: 1.42 mg/dL (ref 0.40–1.50)
GFR: 49.22 mL/min — ABNORMAL LOW (ref >60.00)
Glucose, Bld: 147 mg/dL — ABNORMAL HIGH (ref 70–99)
Potassium: 4.2 meq/L (ref 3.5–5.1)
Sodium: 137 meq/L (ref 135–145)

## 2024-09-07 ENCOUNTER — Other Ambulatory Visit: Payer: Self-pay | Admitting: Family Medicine

## 2024-09-11 ENCOUNTER — Other Ambulatory Visit: Payer: Self-pay

## 2024-09-11 MED ORDER — EZETIMIBE 10 MG PO TABS: 10.0000 mg | ORAL_TABLET | Freq: Every day | ORAL | 1 refills | Status: AC

## 2024-09-14 ENCOUNTER — Other Ambulatory Visit: Payer: Self-pay | Admitting: Family Medicine

## 2024-10-07 ENCOUNTER — Other Ambulatory Visit: Payer: Self-pay

## 2024-10-07 ENCOUNTER — Encounter: Payer: Self-pay | Admitting: Family Medicine

## 2024-10-07 ENCOUNTER — Ambulatory Visit: Admitting: Family Medicine

## 2024-10-07 VITALS — BP 130/77 | HR 65 | Ht 67.75 in | Wt 203.4 lb

## 2024-10-07 DIAGNOSIS — E7800 Pure hypercholesterolemia, unspecified: Secondary | ICD-10-CM | POA: Diagnosis not present

## 2024-10-07 DIAGNOSIS — F5101 Primary insomnia: Secondary | ICD-10-CM

## 2024-10-07 DIAGNOSIS — Z0000 Encounter for general adult medical examination without abnormal findings: Secondary | ICD-10-CM | POA: Diagnosis not present

## 2024-10-07 DIAGNOSIS — Z79899 Other long term (current) drug therapy: Secondary | ICD-10-CM

## 2024-10-07 DIAGNOSIS — I10 Essential (primary) hypertension: Secondary | ICD-10-CM

## 2024-10-07 DIAGNOSIS — E119 Type 2 diabetes mellitus without complications: Secondary | ICD-10-CM

## 2024-10-07 DIAGNOSIS — Z794 Long term (current) use of insulin: Secondary | ICD-10-CM | POA: Diagnosis not present

## 2024-10-07 DIAGNOSIS — Z7984 Long term (current) use of oral hypoglycemic drugs: Secondary | ICD-10-CM | POA: Diagnosis not present

## 2024-10-07 DIAGNOSIS — Z23 Encounter for immunization: Secondary | ICD-10-CM

## 2024-10-07 LAB — COMPREHENSIVE METABOLIC PANEL WITH GFR
ALT: 25 U/L (ref 0–53)
AST: 24 U/L (ref 0–37)
Albumin: 4.4 g/dL (ref 3.5–5.2)
Alkaline Phosphatase: 24 U/L — ABNORMAL LOW (ref 39–117)
BUN: 27 mg/dL — ABNORMAL HIGH (ref 6–23)
CO2: 32 meq/L (ref 19–32)
Calcium: 9.2 mg/dL (ref 8.4–10.5)
Chloride: 99 meq/L (ref 96–112)
Creatinine, Ser: 1.32 mg/dL (ref 0.40–1.50)
GFR: 53.62 mL/min — ABNORMAL LOW (ref >60.00)
Glucose, Bld: 164 mg/dL — ABNORMAL HIGH (ref 70–99)
Potassium: 4.5 meq/L (ref 3.5–5.1)
Sodium: 139 meq/L (ref 135–145)
Total Bilirubin: 0.8 mg/dL (ref 0.2–1.2)
Total Protein: 6.6 g/dL (ref 6.0–8.3)

## 2024-10-07 LAB — CBC WITH DIFFERENTIAL/PLATELET
Basophils Absolute: 0 K/uL (ref 0.0–0.1)
Basophils Relative: 0.8 % (ref 0.0–3.0)
Eosinophils Absolute: 0.1 K/uL (ref 0.0–0.7)
Eosinophils Relative: 3.7 % (ref 0.0–5.0)
HCT: 43.9 % (ref 39.0–52.0)
Hemoglobin: 14.5 g/dL (ref 13.0–17.0)
Lymphocytes Relative: 25.6 % (ref 12.0–46.0)
Lymphs Abs: 1 K/uL (ref 0.7–4.0)
MCHC: 33 g/dL (ref 30.0–36.0)
MCV: 92.1 fl (ref 78.0–100.0)
Monocytes Absolute: 0.3 K/uL (ref 0.1–1.0)
Monocytes Relative: 7.3 % (ref 3.0–12.0)
Neutro Abs: 2.3 K/uL (ref 1.4–7.7)
Neutrophils Relative %: 62.6 % (ref 43.0–77.0)
Platelets: 166 K/uL (ref 150.0–400.0)
RBC: 4.76 Mil/uL (ref 4.22–5.81)
RDW: 13.5 % (ref 11.5–15.5)
WBC: 3.7 K/uL — ABNORMAL LOW (ref 4.0–10.5)

## 2024-10-07 LAB — POCT GLYCOSYLATED HEMOGLOBIN (HGB A1C)
HbA1c POC (<> result, manual entry): 8 % (ref 4.0–5.6)
HbA1c, POC (controlled diabetic range): 8 % — AB (ref 0.0–7.0)
HbA1c, POC (prediabetic range): 8 % — AB (ref 5.7–6.4)
Hemoglobin A1C: 8 % — AB (ref 4.0–5.6)

## 2024-10-07 LAB — LIPID PANEL
Cholesterol: 154 mg/dL (ref 0–200)
HDL: 33.2 mg/dL — ABNORMAL LOW (ref >39.00)
LDL Cholesterol: 85 mg/dL (ref 0–99)
NonHDL: 120.94
Total CHOL/HDL Ratio: 5
Triglycerides: 182 mg/dL — ABNORMAL HIGH (ref 0.0–149.0)
VLDL: 36.4 mg/dL (ref 0.0–40.0)

## 2024-10-07 MED ORDER — FREESTYLE LIBRE 3 PLUS SENSOR MISC: 3 refills | Status: AC

## 2024-10-07 NOTE — Progress Notes (Signed)
 Office Note 10/07/2024  CC:  Chief Complaint  Patient presents with   Annual Exam    Pt is not fasting   Patient is a 73 y.o. male who is here for annual health maintenance exam and 46-month follow-up diabetes, hypertension, and hypercholesterolemia. A/P as of last visit: 1 diabetes without complication, good control. Point-of-care hemoglobin A1c here today is 7.1%. Continue metformin  1000 mg twice a day and Tresiba  25 units every morning.  He sometimes takes up to 15 units of Tresiba  in the evenings. Urine microalbumin/creatinine today.   #2 mixed hyperlipidemia. Atorvastatin  80 mg daily, fenofibrate  145 daily, and Zetia  10 mg daily. When LDL has been above goal in the past he has declined further trial of PCSK9 inhibitor (he associates past use of Repatha  with significant hyperglycemia). Plan recheck lipids in 3 months.   3.  Hypertension, well-controlled on HCTZ 25 mg a day. Electrolytes and creatinine today.   4.  Insomnia, doing well long-term on alprazolam  1 mg, 1-2 nightly. New prescription for #180, refill x 1.  INTERIM HX: Shaun Knight is feeling well.  25 units Tresiba  in the morning and 15 units in the evening.  He sometimes holds his evening dose if glucose is near normal.  He has had 1 hypoglycemic event since I last saw him and says that was because he gave an insulin  injection but did not eat. No tingling, burning, or numbness in feet.  He gets timely sleep initiation and sleeps through the night as long as he takes his alprazolam .  No hangover effect. PMP AWARE reviewed today: most recent rx for alprazolam  was filled 09/12/2024, # 180, rx by me. No red flags.   Past Medical History:  Diagnosis Date   Adenomatous colon polyp 04/2006; 2018   2007 Dr. Geroge; 2018 Dr. Mort 04/2022.   Chronic renal insufficiency, stage II (mild) 2017   CrCl 60s   Diabetes mellitus    Diverticulosis of colon    hospitalized for diverticulitis in remote past; also had  episode 08/27/14   Fatty liver 2015   Noted on noncontrast CT done for eval for kidney stones   History of ETT    LOW RISK/NEG ETT 02/2011 w/Dr. Tisa   History of hemorrhoids    Hyperlipidemia    Crestor  caused leg cramps   Hypertension    Left foot pain    medial: post tibial tendonitis---podiatrist did sheath injection 03/2018 and 02/2019   Nephrolithiasis    Saw urologist in DIST[facility] past (?[facility]). Left mid ureteral stone 01/2016--ED visit.   Prostate nodule 08/2018   Referred to urol (PSA was normal/unchanged from prev year).   Renal cyst, left    hemorrhagic vs proteinacious (last f/u CT 2007 showed stability)--Dr. Geroge.   Shoulder pain, right     Injection on 2 consec mo at [facility] (Dr. Addie) 2012.  [facility] jt inj 12/2022 ortho    Past Surgical History:  Procedure Laterality Date   CHALAZION EXCISION  summer 2015   COLONOSCOPY W/ POLYPECTOMY  04/2006; 04/2017   2007: Adenomatous polyps x 2; recall 5 yrs (Dr. Geroge).  Repeat 04/2017 (Dr. Phares adenoma x1 + sigmoid diverticulosis).  Recall 5 yrs.   ESOPHAGOGASTRODUODENOSCOPY  2008   Esophagitis/gastritis/GERD.  H pylori and Barrett's NEG.   HEMORRHOID SURGERY     NOSE SURGERY     deviated septum    Family History  Problem Relation Age of Onset   Diabetes Mother    Hypertension Mother    Coronary artery  disease Mother    Diabetes Father    Hypertension Father    Coronary artery disease Father    Lymphoma Father        non-hodgkin   Obesity Daughter    Cancer Paternal Grandmother        unknown    Social History   Socioeconomic History   Marital status: Married    Spouse name: Not on file   Number of children: Not on file   Years of education: Not on file   Highest education level: 12th grade  Occupational History   Not on file  Tobacco Use   Smoking status: Never   Smokeless tobacco: Never  Vaping Use   Vaping status: Never Used  Substance and Sexual Activity   Alcohol use: No   Drug use: No    Sexual activity: Yes    Partners: Female  Other Topics Concern   Not on file  Social History Narrative   Married, 1 son and 1 daughter.   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   No T/A/Ds.   Social Drivers of Corporate investment banker Strain: Low Risk  (04/24/2024)   Overall Financial Resource Strain (CARDIA)    Difficulty of Paying Living Expenses: Not hard at all  Food Insecurity: No Food Insecurity (04/24/2024)   Hunger Vital Sign    Worried About Running Out of Food in the Last Year: Never true    Ran Out of Food in the Last Year: Never true  Transportation Needs: No Transportation Needs (04/24/2024)   PRAPARE - Administrator, Civil Service (Medical): No    Lack of Transportation (Non-Medical): No  Physical Activity: Inactive (04/24/2024)   Exercise Vital Sign    Days of Exercise per Week: 3 days    Minutes of Exercise per Session: 0 min  Stress: Stress Concern Present (04/24/2024)   [employer] of Occupational Health - Occupational Stress Questionnaire    Feeling of Stress : To some extent  Social Connections: Socially Integrated (04/24/2024)   Social Connection and Isolation Panel    Frequency of Communication with Friends and Family: More than three times a week    Frequency of Social Gatherings with Friends and Family: More than three times a week    Attends Religious Services: More than 4 times per year    Active Member of [employer] or Organizations: Yes    Attends Engineer, structural: More than 4 times per year    Marital Status: Married  Catering manager Violence: Not At Risk (04/24/2024)   Humiliation, Afraid, Rape, and Kick questionnaire    Fear of Current or Ex-Partner: No    Emotionally Abused: No    Physically Abused: No    Sexually Abused: No    Outpatient Medications Prior to Visit  Medication Sig Dispense Refill   ALPRAZolam  (XANAX ) 1 MG tablet 1-2 tabs po at bedtime insomnia 180 tablet 1    atorvastatin  (LIPITOR) 80 MG tablet TAKE 1 TABLET(80 MG) BY MOUTH DAILY 90 tablet 3   Continuous Glucose Sensor (FREESTYLE LIBRE 3 SENSOR) MISC PLACE 1 SENSOR ONTO THE SKIN EVERY 14 DAYS 3 each 2   ezetimibe  (ZETIA ) 10 MG tablet Take 1 tablet (10 mg total) by mouth daily. 90 tablet 1   fenofibrate  (TRICOR ) 145 MG tablet TAKE 1 TABLET(145 MG) BY MOUTH DAILY 90 tablet 3   hydrochlorothiazide  (HYDRODIURIL ) 25 MG tablet TAKE 1 TABLET(25 MG) BY MOUTH DAILY 90 tablet 3  Insulin  Pen Needle (PEN NEEDLES) 31G X 5 MM MISC 1 each by Does not apply route daily. 100 each 12   metFORMIN  (GLUCOPHAGE ) 1000 MG tablet TAKE 1 TABLET(1000 MG) BY MOUTH TWICE DAILY 180 tablet 3   pantoprazole  (PROTONIX ) 40 MG tablet TAKE 1 TABLET(40 MG) BY MOUTH DAILY 90 tablet 3   TRESIBA  FLEXTOUCH 100 UNIT/ML FlexTouch Pen INJECT 20 UNITS INTO THE SKIN EVERY MORNING AND 15 UNITS EVERY EVENING 15 mL 1   No facility-administered medications prior to visit.    Allergies  Allergen Reactions   Repatha  [Evolocumab ] Other (See Comments)    hyperglycemia    Review of Systems  Constitutional:  Negative for appetite change, chills, fatigue and fever.  HENT:  Negative for congestion, dental problem, ear pain and sore throat.   Eyes:  Negative for discharge, redness and visual disturbance.  Respiratory:  Negative for cough, chest tightness, shortness of breath and wheezing.   Cardiovascular:  Negative for chest pain, palpitations and leg swelling.  Gastrointestinal:  Negative for abdominal pain, blood in stool, diarrhea, nausea and vomiting.  Genitourinary:  Negative for difficulty urinating, dysuria, flank pain, frequency, hematuria and urgency.  Musculoskeletal:  Negative for arthralgias, back pain, joint swelling, myalgias and neck stiffness.  Skin:  Negative for pallor and rash.  Neurological:  Negative for dizziness, speech difficulty, weakness and headaches.  Hematological:  Negative for adenopathy. Does not bruise/bleed  easily.  Psychiatric/Behavioral:  Negative for confusion and sleep disturbance. The patient is not nervous/anxious.     PE;    10/07/2024    8:50 AM 10/07/2024    8:49 AM 06/27/2024    3:18 PM  Vitals with BMI  Height  5' 7.75 5' 7.5  Weight  203 lbs 6 oz 205 lbs 6 oz  BMI  31.15 31.68  Systolic 130 138 870  Diastolic 77 63 75  Pulse  65 77   Gen: Alert, well appearing.  Patient is oriented to person, place, time, and situation. AFFECT: pleasant, lucid thought and speech. ENT: Ears: EACs clear, normal epithelium.  TMs with good light reflex and landmarks bilaterally.  Eyes: no injection, icteris, swelling, or exudate.  EOMI, PERRLA. Nose: no drainage or turbinate edema/swelling.  No injection or focal lesion.  Mouth: lips without lesion/swelling.  Oral mucosa pink and moist.  Dentition intact and without obvious caries or gingival swelling.  Oropharynx without erythema, exudate, or swelling.  Neck: supple/nontender.  No LAD, mass, or TM.  Carotid pulses 2+ bilaterally, without bruits. CV: RRR, no m/r/g.   LUNGS: CTA bilat, nonlabored resps, good aeration in all lung fields. ABD: soft, NT, ND, BS normal.  No hepatospenomegaly or mass.  No bruits. EXT: no clubbing, cyanosis, or edema.  Musculoskeletal: no joint swelling, erythema, warmth, or tenderness.  ROM of all joints intact. Skin - no sores or suspicious lesions or rashes or color changes  Pertinent labs:  Lab Results  Component Value Date   TSH 1.22 08/28/2018   Lab Results  Component Value Date   WBC 5.4 09/11/2023   HGB 14.6 09/11/2023   HCT 45.1 09/11/2023   MCV 93.0 09/11/2023   PLT 181.0 09/11/2023   Lab Results  Component Value Date   CREATININE 1.42 06/27/2024   BUN 23 06/27/2024   NA 137 06/27/2024   K 4.2 06/27/2024   CL 100 06/27/2024   CO2 30 06/27/2024   Lab Results  Component Value Date   ALT 33 01/11/2024   AST 31 01/11/2024  ALKPHOS 28 (L) 01/11/2024   BILITOT 0.8 01/11/2024   Lab  Results  Component Value Date   CHOL 160 01/11/2024   Lab Results  Component Value Date   HDL 40.90 01/11/2024   Lab Results  Component Value Date   LDLCALC 86 01/11/2024   Lab Results  Component Value Date   TRIG 168.0 (H) 01/11/2024   Lab Results  Component Value Date   CHOLHDL 4 01/11/2024   Lab Results  Component Value Date   PSA 0.60 01/11/2024   PSA 0.54 12/28/2022   PSA 0.66 12/30/2021   Lab Results  Component Value Date   HGBA1C 8.0 (A) 10/07/2024   HGBA1C 8.0 10/07/2024   HGBA1C 8.0 (A) 10/07/2024   HGBA1C 8.0 (A) 10/07/2024   ASSESSMENT AND PLAN:   No problem-specific Assessment & Plan notes found for this encounter.  #1 health maintenance exam: Reviewed age and gender appropriate health maintenance issues (prudent diet, regular exercise, health risks of tobacco and excessive alcohol, use of seatbelts, fire alarms in home, use of sunscreen).  Also reviewed age and gender appropriate health screening as well as vaccine recommendations. Vaccines: shingrix -> declined.  Tdap--> declined.  Flu vaccine given today. Labs: CBC with differential, c-Met, lipid panel (not fasting). Prostate ca screening: Next PSA due after January 2026. Colon ca screening: History of adenomatous polyps.  Recall due as of 2023.  2 diabetes without complication, control is not as good lately.  He says his diet is not ideal.  Sometimes he holds his nighttime insulin  dose. Point-of-care hemoglobin A1c here today is 8.0%. He prefers to continue his regimen unchanged:   Metformin  1000 mg twice a day and Tresiba  25 units every morning and 15 units of Tresiba  in the evenings. Exam normal today. Check serum creatinine/GFR today.   #2 mixed hyperlipidemia. Atorvastatin  80 mg daily, fenofibrate  145 daily, and Zetia  10 mg daily. When LDL has been above goal in the past he has declined further trial of PCSK9 inhibitor (he associates past use of Repatha  with significant hyperglycemia). Lipid  panel and hepatic panel today.   3.  Hypertension, well-controlled on HCTZ 25 mg a day. Electrolytes and creatinine today.   4.  Insomnia, doing well long-term on alprazolam  1 mg, 1-2 nightly. A new prescription was not needed today.  An After Visit Summary was printed and given to the patient.  FOLLOW UP:  Return in about 3 months (around 01/07/2025) for routine chronic illness f/u.  Signed:  Gerlene Hockey, MD           10/07/2024

## 2024-10-08 ENCOUNTER — Ambulatory Visit: Payer: Self-pay | Admitting: Family Medicine

## 2024-12-01 ENCOUNTER — Other Ambulatory Visit: Payer: Self-pay | Admitting: Family Medicine

## 2025-01-01 ENCOUNTER — Other Ambulatory Visit: Payer: Self-pay | Admitting: Family Medicine

## 2025-01-01 ENCOUNTER — Other Ambulatory Visit: Payer: Self-pay

## 2025-01-01 MED ORDER — PANTOPRAZOLE SODIUM 40 MG PO TBEC: DELAYED_RELEASE_TABLET | ORAL | 3 refills | Status: AC

## 2025-01-07 ENCOUNTER — Ambulatory Visit: Admitting: Family Medicine

## 2025-01-07 ENCOUNTER — Encounter: Payer: Self-pay | Admitting: Family Medicine

## 2025-01-07 NOTE — Progress Notes (Unsigned)
 OFFICE VISIT  01/07/2025  CC: No chief complaint on file.   Patient is a 74 y.o. male who presents for 76-month follow-up diabetes, hypertension, and hyperlipidemia. A/P as of last visit: 1 diabetes without complication, control is not as good lately.  He says his diet is not ideal.  Sometimes he holds his nighttime insulin  dose. Point-of-care hemoglobin A1c here today is 8.0%. He prefers to continue his regimen unchanged:   Metformin  1000 mg twice a day and Tresiba  25 units every morning and 15 units of Tresiba  in the evenings. Exam normal today. Check serum creatinine/GFR today.   #2 mixed hyperlipidemia. Atorvastatin  80 mg daily, fenofibrate  145 daily, and Zetia  10 mg daily. When LDL has been above goal in the past he has declined further trial of PCSK9 inhibitor (he associates past use of Repatha  with significant hyperglycemia). Lipid panel and hepatic panel today.   3.  Hypertension, well-controlled on HCTZ 25 mg a day. Electrolytes and creatinine today.   4.  Insomnia, doing well long-term on alprazolam  1 mg, 1-2 nightly. A new prescription was not needed today.  INTERIM HX: ***    PMP AWARE reviewed today: most recent rx for alprazolam  was filled 12/16/2024, # 180, rx by me. No red flags.  Past Medical History:  Diagnosis Date   Adenomatous colon polyp 04/2006; 2018   2007 Dr. Geroge; 2018 Dr. Mort 04/2022.   Chronic renal insufficiency, stage II (mild) 2017   CrCl 60s   Diabetes mellitus    Diverticulosis of colon    hospitalized for diverticulitis in remote past; also had episode 08/27/14   Fatty liver 2015   Noted on noncontrast CT done for eval for kidney stones   History of ETT    LOW RISK/NEG ETT 02/2011 w/Dr. Tisa   History of hemorrhoids    Hyperlipidemia    Crestor  caused leg cramps   Hypertension    Left foot pain    medial: post tibial tendonitis---podiatrist did sheath injection 03/2018 and 02/2019   Nephrolithiasis    Saw urologist in  DISTANT past (?[facility]). Left mid ureteral stone 01/2016--ED visit.   Prostate nodule 08/2018   Referred to urol (PSA was normal/unchanged from prev year).   Renal cyst, left    hemorrhagic vs proteinacious (last f/u CT 2007 showed stability)--Dr. Geroge.   Shoulder pain, right     Injection on 2 consec mo at [facility] (Dr. Addie) 2012.  [facility] jt inj 12/2022 ortho    Past Surgical History:  Procedure Laterality Date   CHALAZION EXCISION  summer 2015   COLONOSCOPY W/ POLYPECTOMY  04/2006; 04/2017   2007: Adenomatous polyps x 2; recall 5 yrs (Dr. Geroge).  Repeat 04/2017 (Dr. Phares adenoma x1 + sigmoid diverticulosis).  Recall 5 yrs.   ESOPHAGOGASTRODUODENOSCOPY  2008   Esophagitis/gastritis/GERD.  H pylori and Barrett's NEG.   HEMORRHOID SURGERY     NOSE SURGERY     deviated septum    Outpatient Medications Prior to Visit  Medication Sig Dispense Refill   ALPRAZolam  (XANAX ) 1 MG tablet 1-2 tabs po at bedtime insomnia 180 tablet 1   atorvastatin  (LIPITOR) 80 MG tablet TAKE 1 TABLET(80 MG) BY MOUTH DAILY 90 tablet 0   Continuous Glucose Sensor (FREESTYLE LIBRE 3 PLUS SENSOR) MISC Change sensor every 15 days. 6 each 3   ezetimibe  (ZETIA ) 10 MG tablet Take 1 tablet (10 mg total) by mouth daily. 90 tablet 1   fenofibrate  (TRICOR ) 145 MG tablet TAKE 1 TABLET(145 MG) BY MOUTH  DAILY 90 tablet 3   hydrochlorothiazide  (HYDRODIURIL ) 25 MG tablet TAKE 1 TABLET(25 MG) BY MOUTH DAILY 90 tablet 0   Insulin  Pen Needle (PEN NEEDLES) 31G X 5 MM MISC 1 each by Does not apply route daily. 100 each 12   metFORMIN  (GLUCOPHAGE ) 1000 MG tablet TAKE 1 TABLET(1000 MG) BY MOUTH TWICE DAILY 180 tablet 3   pantoprazole  (PROTONIX ) 40 MG tablet TAKE 1 TABLET(40 MG) BY MOUTH DAILY 90 tablet 3   TRESIBA  FLEXTOUCH 100 UNIT/ML FlexTouch Pen INJECT 20 UNITS INTO THE SKIN EVERY MORNING AND 15 UNITS EVERY EVENING 15 mL 0   No facility-administered medications prior to visit.    Allergies[1]  Review of Systems As  per HPI  PE:    10/07/2024    8:50 AM 10/07/2024    8:49 AM 06/27/2024    3:18 PM  Vitals with BMI  Height  5' 7.75 5' 7.5  Weight  203 lbs 6 oz 205 lbs 6 oz  BMI  31.15 31.68  Systolic 130 138 870  Diastolic 77 63 75  Pulse  65 77     Physical Exam  ***  LABS:  Last CBC Lab Results  Component Value Date   WBC 3.7 (L) 10/07/2024   HGB 14.5 10/07/2024   HCT 43.9 10/07/2024   MCV 92.1 10/07/2024   MCH 31.0 08/24/2021   RDW 13.5 10/07/2024   PLT 166.0 10/07/2024   Last metabolic panel Lab Results  Component Value Date   GLUCOSE 164 (H) 10/07/2024   NA 139 10/07/2024   K 4.5 10/07/2024   CL 99 10/07/2024   CO2 32 10/07/2024   BUN 27 (H) 10/07/2024   CREATININE 1.32 10/07/2024   GFR 53.62 (L) 10/07/2024   CALCIUM  9.2 10/07/2024   PROT 6.6 10/07/2024   ALBUMIN 4.4 10/07/2024   BILITOT 0.8 10/07/2024   ALKPHOS 24 (L) 10/07/2024   AST 24 10/07/2024   ALT 25 10/07/2024   ANIONGAP 10 08/24/2021   Last lipids Lab Results  Component Value Date   CHOL 154 10/07/2024   HDL 33.20 (L) 10/07/2024   LDLCALC 85 10/07/2024   LDLDIRECT 127.0 12/31/2020   TRIG 182.0 (H) 10/07/2024   CHOLHDL 5 10/07/2024   Last hemoglobin A1c Lab Results  Component Value Date   HGBA1C 8.0 (A) 10/07/2024   HGBA1C 8.0 10/07/2024   HGBA1C 8.0 (A) 10/07/2024   HGBA1C 8.0 (A) 10/07/2024   Last thyroid  functions Lab Results  Component Value Date   TSH 1.22 08/28/2018   IMPRESSION AND PLAN:  No problem-specific Assessment & Plan notes found for this encounter.   An After Visit Summary was printed and given to the patient.  FOLLOW UP: No follow-ups on file. Next CPE October/November 2026 Signed:  Gerlene Hockey, MD           01/07/2025       [1]  Allergies Allergen Reactions   Repatha  [Evolocumab ] Other (See Comments)    hyperglycemia

## 2025-01-16 ENCOUNTER — Other Ambulatory Visit: Payer: Self-pay | Admitting: Family Medicine

## 2025-04-30 ENCOUNTER — Ambulatory Visit
